# Patient Record
Sex: Male | Born: 1975 | Race: White | Hispanic: Yes | Marital: Single | Smoking: Never smoker
Health system: Southern US, Community
[De-identification: ages and names within clinical notes are randomized; demographics above are authoritative.]

## PROBLEM LIST (undated history)

## (undated) DIAGNOSIS — J310 Chronic rhinitis: Secondary | ICD-10-CM

## (undated) DIAGNOSIS — G4710 Hypersomnia, unspecified: Secondary | ICD-10-CM

## (undated) DIAGNOSIS — F32A Depression, unspecified: Secondary | ICD-10-CM

## (undated) DIAGNOSIS — G4730 Sleep apnea, unspecified: Secondary | ICD-10-CM

## (undated) DIAGNOSIS — F419 Anxiety disorder, unspecified: Secondary | ICD-10-CM

## (undated) DIAGNOSIS — I10 Essential (primary) hypertension: Secondary | ICD-10-CM

## (undated) DIAGNOSIS — H9190 Unspecified hearing loss, unspecified ear: Secondary | ICD-10-CM

## (undated) HISTORY — PX: TOE SURGERY: SHX1073

## (undated) HISTORY — DX: Chronic rhinitis: J31.0

## (undated) HISTORY — PX: WISDOM TOOTH EXTRACTION: SHX21

## (undated) HISTORY — DX: Hypersomnia, unspecified: G47.10

---

## 1998-07-24 ENCOUNTER — Ambulatory Visit: Admission: RE | Admit: 1998-07-24 | Discharge: 1998-07-24 | Payer: Self-pay | Admitting: Internal Medicine

## 2006-03-04 ENCOUNTER — Ambulatory Visit: Payer: Self-pay | Admitting: Internal Medicine

## 2007-03-19 DIAGNOSIS — G4710 Hypersomnia, unspecified: Secondary | ICD-10-CM | POA: Insufficient documentation

## 2007-03-21 ENCOUNTER — Ambulatory Visit: Payer: Self-pay | Admitting: Internal Medicine

## 2007-04-15 ENCOUNTER — Telehealth: Payer: Self-pay | Admitting: Internal Medicine

## 2007-05-11 ENCOUNTER — Telehealth: Payer: Self-pay | Admitting: Internal Medicine

## 2007-06-20 ENCOUNTER — Telehealth: Payer: Self-pay | Admitting: Internal Medicine

## 2007-07-14 ENCOUNTER — Telehealth: Payer: Self-pay | Admitting: Internal Medicine

## 2007-08-15 ENCOUNTER — Telehealth: Payer: Self-pay | Admitting: Internal Medicine

## 2007-09-19 ENCOUNTER — Telehealth: Payer: Self-pay | Admitting: Internal Medicine

## 2007-10-19 ENCOUNTER — Telehealth: Payer: Self-pay | Admitting: Internal Medicine

## 2007-11-15 ENCOUNTER — Telehealth: Payer: Self-pay | Admitting: Internal Medicine

## 2007-12-16 ENCOUNTER — Telehealth (INDEPENDENT_AMBULATORY_CARE_PROVIDER_SITE_OTHER): Payer: Self-pay | Admitting: *Deleted

## 2008-01-13 ENCOUNTER — Telehealth (INDEPENDENT_AMBULATORY_CARE_PROVIDER_SITE_OTHER): Payer: Self-pay | Admitting: *Deleted

## 2008-02-09 ENCOUNTER — Telehealth: Payer: Self-pay | Admitting: Internal Medicine

## 2008-03-20 ENCOUNTER — Ambulatory Visit: Payer: Self-pay | Admitting: Internal Medicine

## 2008-03-20 DIAGNOSIS — J302 Other seasonal allergic rhinitis: Secondary | ICD-10-CM | POA: Insufficient documentation

## 2008-03-20 DIAGNOSIS — J3089 Other allergic rhinitis: Secondary | ICD-10-CM

## 2008-04-09 ENCOUNTER — Ambulatory Visit (HOSPITAL_BASED_OUTPATIENT_CLINIC_OR_DEPARTMENT_OTHER): Admission: RE | Admit: 2008-04-09 | Discharge: 2008-04-09 | Payer: Self-pay | Admitting: Internal Medicine

## 2008-04-09 ENCOUNTER — Encounter: Payer: Self-pay | Admitting: Internal Medicine

## 2008-04-13 ENCOUNTER — Telehealth: Payer: Self-pay | Admitting: Internal Medicine

## 2008-04-13 ENCOUNTER — Ambulatory Visit: Payer: Self-pay | Admitting: Internal Medicine

## 2008-05-03 ENCOUNTER — Ambulatory Visit: Payer: Self-pay | Admitting: Internal Medicine

## 2008-06-14 ENCOUNTER — Telehealth: Payer: Self-pay | Admitting: Internal Medicine

## 2008-07-16 ENCOUNTER — Telehealth: Payer: Self-pay | Admitting: Internal Medicine

## 2008-08-17 ENCOUNTER — Telehealth: Payer: Self-pay | Admitting: Internal Medicine

## 2008-11-13 ENCOUNTER — Telehealth (INDEPENDENT_AMBULATORY_CARE_PROVIDER_SITE_OTHER): Payer: Self-pay | Admitting: *Deleted

## 2008-11-27 ENCOUNTER — Telehealth: Payer: Self-pay | Admitting: Internal Medicine

## 2008-12-18 ENCOUNTER — Telehealth: Payer: Self-pay | Admitting: Internal Medicine

## 2009-01-15 ENCOUNTER — Telehealth (INDEPENDENT_AMBULATORY_CARE_PROVIDER_SITE_OTHER): Payer: Self-pay | Admitting: *Deleted

## 2009-02-15 ENCOUNTER — Telehealth: Payer: Self-pay | Admitting: Internal Medicine

## 2009-02-19 ENCOUNTER — Ambulatory Visit: Payer: Self-pay | Admitting: Internal Medicine

## 2009-03-18 ENCOUNTER — Telehealth (INDEPENDENT_AMBULATORY_CARE_PROVIDER_SITE_OTHER): Payer: Self-pay | Admitting: *Deleted

## 2009-04-12 ENCOUNTER — Telehealth: Payer: Self-pay | Admitting: Internal Medicine

## 2009-05-14 ENCOUNTER — Telehealth: Payer: Self-pay | Admitting: Internal Medicine

## 2009-06-17 ENCOUNTER — Telehealth: Payer: Self-pay | Admitting: Internal Medicine

## 2009-07-16 ENCOUNTER — Telehealth (INDEPENDENT_AMBULATORY_CARE_PROVIDER_SITE_OTHER): Payer: Self-pay | Admitting: *Deleted

## 2009-08-15 ENCOUNTER — Telehealth: Payer: Self-pay | Admitting: Internal Medicine

## 2009-09-16 ENCOUNTER — Telehealth: Payer: Self-pay | Admitting: Internal Medicine

## 2009-10-09 ENCOUNTER — Telehealth (INDEPENDENT_AMBULATORY_CARE_PROVIDER_SITE_OTHER): Payer: Self-pay | Admitting: *Deleted

## 2009-11-07 ENCOUNTER — Telehealth (INDEPENDENT_AMBULATORY_CARE_PROVIDER_SITE_OTHER): Payer: Self-pay | Admitting: *Deleted

## 2009-12-12 ENCOUNTER — Telehealth (INDEPENDENT_AMBULATORY_CARE_PROVIDER_SITE_OTHER): Payer: Self-pay | Admitting: *Deleted

## 2009-12-16 ENCOUNTER — Telehealth (INDEPENDENT_AMBULATORY_CARE_PROVIDER_SITE_OTHER): Payer: Self-pay | Admitting: *Deleted

## 2010-01-08 ENCOUNTER — Telehealth (INDEPENDENT_AMBULATORY_CARE_PROVIDER_SITE_OTHER): Payer: Self-pay | Admitting: *Deleted

## 2010-01-21 ENCOUNTER — Ambulatory Visit: Payer: Self-pay | Admitting: Internal Medicine

## 2010-02-06 ENCOUNTER — Telehealth (INDEPENDENT_AMBULATORY_CARE_PROVIDER_SITE_OTHER): Payer: Self-pay | Admitting: *Deleted

## 2010-02-10 ENCOUNTER — Telehealth (INDEPENDENT_AMBULATORY_CARE_PROVIDER_SITE_OTHER): Payer: Self-pay | Admitting: *Deleted

## 2010-02-11 ENCOUNTER — Ambulatory Visit: Payer: Self-pay | Admitting: Internal Medicine

## 2010-02-12 ENCOUNTER — Encounter: Payer: Self-pay | Admitting: Internal Medicine

## 2010-02-13 ENCOUNTER — Telehealth (INDEPENDENT_AMBULATORY_CARE_PROVIDER_SITE_OTHER): Payer: Self-pay | Admitting: *Deleted

## 2010-02-14 LAB — CONVERTED CEMR LAB: Rh Type: POSITIVE

## 2010-03-12 ENCOUNTER — Telehealth (INDEPENDENT_AMBULATORY_CARE_PROVIDER_SITE_OTHER): Payer: Self-pay | Admitting: *Deleted

## 2010-04-09 ENCOUNTER — Telehealth (INDEPENDENT_AMBULATORY_CARE_PROVIDER_SITE_OTHER): Payer: Self-pay | Admitting: *Deleted

## 2010-05-12 ENCOUNTER — Telehealth: Payer: Self-pay | Admitting: Internal Medicine

## 2010-05-21 ENCOUNTER — Telehealth (INDEPENDENT_AMBULATORY_CARE_PROVIDER_SITE_OTHER): Payer: Self-pay | Admitting: *Deleted

## 2010-06-24 NOTE — Progress Notes (Signed)
Summary: results of bloodwork- called again  Phone Note Call from Patient   Caller: wife [phone] Call For: young Summary of Call: looking for lab results Initial call taken by: Lacinda Axon,  February 13, 2010 2:39 PM  Follow-up for Phone Call        Cy, pt had bloodwork done yesterday, 02/12/2010.  Lab unsigned.  Please advise.  Thank you.  Marland KitchenArman Filter LPN  February 13, 2010 3:07 PM  Pt's spouse Silva Bandy called back again req results. [phone]. Tivis Ringer, CNA  February 14, 2010 3:21 PM  Additional Follow-up for Phone Call Additional follow up Details #1::        Pt is aware of blood type. Verlon Au spoke with spouse-pt gave okay to give results.[name] CMA  February 14, 2010 4:09 PM

## 2010-06-24 NOTE — Progress Notes (Signed)
Summary: PRESCRIPT  Phone Note Call from Patient Call back at [phone]   Caller: Patient Call For: Laasya Peyton Summary of Call: NEED GENERIC RITALIN 10MG  AND 20MG  EXTENDED RELEASE Initial call taken by: Rickard Patience,  September 16, 2009 9:42 AM  Follow-up for Phone Call        rx's printed for cy to sign, pt would like rx's mailed to him   Philipp Deputy [facility]  September 16, 2009 11:26 AM   Additional Follow-up for Phone Call Additional follow up Details #1::        Pt aware that Rx is in mail.[name] CMA  September 16, 2009 11:55 AM     Prescriptions: RITALIN 10 MG  TABS (METHYLPHENIDATE HCL) 1-2 daily prn  #60 x 0   Entered by:   Philipp Deputy CMA   Authorized by:   Waymon Budge MD   Signed by:   Philipp Deputy CMA on 09/16/2009   Method used:   Print then Mail to Patient   RxID:   [id number] RITALIN SR 20 MG  TBCR (METHYLPHENIDATE HCL) one by mouth qd  #30 x 0   Entered by:   Philipp Deputy CMA   Authorized by:   Waymon Budge MD   Signed by:   Philipp Deputy CMA on 09/16/2009   Method used:   Print then Mail to Patient   RxID:   [id number]

## 2010-06-24 NOTE — Progress Notes (Signed)
Summary: prescript  Phone Note Call from Patient Call back at [phone]   Caller: Patient Call For: Tommy Ellison Summary of Call: need prescript for generic ritalin 20mg  er and 10mg  pls mail to home Initial call taken by: Rickard Patience,  August 15, 2009 3:37 PM  Follow-up for Phone Call        rx printed and placed on CY look-at. Carron Curie CMA  August 15, 2009 4:39 PM     Prescriptions: RITALIN 10 MG  TABS (METHYLPHENIDATE HCL) 1-2 daily prn  #60 x 0   Entered by:   Carron Curie CMA   Authorized by:   Waymon Budge MD   Signed by:   Carron Curie CMA on 08/15/2009   Method used:   Print then Mail to Patient   RxID:   [id number] RITALIN SR 20 MG  TBCR (METHYLPHENIDATE HCL) one by mouth qd  #30 x 0   Entered by:   Carron Curie CMA   Authorized by:   Waymon Budge MD   Signed by:   Carron Curie CMA on 08/15/2009   Method used:   Print then Mail to Patient   RxID:   [id number]

## 2010-06-24 NOTE — Progress Notes (Signed)
Summary: rx  Phone Note Call from Patient Call back at [phone]   Caller: Patient Call For: wert Reason for Call: Refill Medication Summary of Call: Need rx for Ritalin 10mg  and 20mg .  Generic.  Please mail to pt's address. Initial call taken by: Eugene Gavia,  Oct 09, 2009 1:56 PM  Follow-up for Phone Call        RX signed and placed in mail as requested.[name] CMA  Oct 10, 2009 10:00 AM     Prescriptions: RITALIN 10 MG  TABS (METHYLPHENIDATE HCL) 1-2 daily prn  #60 x 0   Entered by:   Vernie Murders   Authorized by:   Waymon Budge MD   Signed by:   Vernie Murders on 10/09/2009   Method used:   Print then Give to Patient   RxID:   [id number] RITALIN SR 20 MG  TBCR (METHYLPHENIDATE HCL) one by mouth qd  #30 x 0   Entered by:   Vernie Murders   Authorized by:   Waymon Budge MD   Signed by:   Vernie Murders on 10/09/2009   Method used:   Print then Give to Patient   RxID:   [id number]

## 2010-06-24 NOTE — Progress Notes (Signed)
Summary: waitin on ritalin rx's   Phone Note Call from Patient   Caller: Patient Call For: young Summary of Call: pt has not yet received his 2 rx's for ritalin. he did , however, receive a letter stating that he needed an appt w/ cy. pt was last seen 02/19/09 so it hasn't even been a yr yet. he has an appt pend for 8/30. however, he needs his rx's asap. call him at [phone] Initial call taken by: Tivis Ringer, CNA,  December 16, 2009 10:25 AM  Follow-up for Phone Call        per EMR, no documentation of letter in EMR.  pt has upcoming appt w/ CDY 8.30.11.  also per EMR, letter was put in the mail thursday afternoon which means it was sent out friday since our mail picks up at 3pm.  so sign that rx's were left up front for pt to pick up.  called spoke with patient, advised him to disregard the letter that he received at that his rx's should arrive tomorrow or wednesday.  pt verbalized his understanding but will call if he does not receive them since tomorrow is last day. Follow-up by: Boone Master CNA/MA,  December 16, 2009 11:16 AM

## 2010-06-24 NOTE — Progress Notes (Signed)
Summary: ritalin rx's - waiting for rx's to be signed  Phone Note Call from Patient Call back at [phone]   Caller: Patient Call For: young Summary of Call: need prescript for generic ritalin 20mg  and 10mg  pls mail to him Initial call taken by: Rickard Patience,  January 08, 2010 3:23 PM  Follow-up for Phone Call        CDY is out fo the office until Monday.  pt's last refills on the ritalin 10mg  and 20mg  was 7.21.11.  if patient has enough at home to last him, we can get CDY to sign when he returns to the office.  otherwise will see if [facility] will sign for pt.  LMOM TCB. Boone Master CNA/MA  January 08, 2010 3:44 PM   [facility] would you sign both Ritalin RX's for this patient? They need to be mailed to him. Please advise. Follow-up by: Michel Bickers CMA,  January 08, 2010 3:53 PM  Additional Follow-up for Phone Call Additional follow up Details #1::        yes Additional Follow-up by: Barbaraann Share MD,  January 08, 2010 4:59 PM    Additional Follow-up for Phone Call Additional follow up Details #2::    ritalin rx's printed off for [facility] to sign. Boone Master CNA/MA  January 08, 2010 5:00 PM    kc signed rx's and mailed to pt's home address which i verified with pt was the correct address.  Aundra Millet Reynolds LPN  January 08, 2010 5:10 PM   Prescriptions: RITALIN 10 MG  TABS (METHYLPHENIDATE HCL) 1-2 daily prn  #60 x 0   Entered by:   Boone Master CNA/MA   Authorized by:   Barbaraann Share MD   Signed by:   Boone Master CNA/MA on 01/08/2010   Method used:   Print then Give to Patient   RxID:   [id number] RITALIN SR 20 MG  TBCR (METHYLPHENIDATE HCL) one by mouth qd  #30 x 0   Entered by:   Boone Master CNA/MA   Authorized by:   Barbaraann Share MD   Signed by:   Boone Master CNA/MA on 01/08/2010   Method used:   Print then Give to Patient   RxID:   [id number]

## 2010-06-24 NOTE — Progress Notes (Signed)
Summary: prescript  Phone Note Call from Patient   Caller: Patient Call For: young Summary of Call: need prescript for generic ritalin 20er and 10mg  Initial call taken by: Rickard Patience,  March 12, 2010 4:03 PM  Follow-up for Phone Call        Last OV 8.30.11, No pending OV  Called, spoke with pt's wife, Lorene Dy.  She states they will need to pick up ritalin rxs.  Requesting to do this tomorrow.  Pls call pt's cell [phone] when ready for pick up.  Rxs printed and placed on CY's cart.  Gweneth Dimitri RN  March 12, 2010 4:22 PM   Additional Follow-up for Phone Call Additional follow up Details #1::        Spoke with patient to let him know that RX's were ready for pick up-pt decided that he would rather have them mailed to him. Pt aware the we are mailing rx's today.[name] CMA  March 12, 2010 4:44 PM     Prescriptions: RITALIN 10 MG  TABS (METHYLPHENIDATE HCL) 1-2 daily prn  #60 x 0   Entered by:   Gweneth Dimitri RN   Authorized by:   Waymon Budge MD   Signed by:   Gweneth Dimitri RN on 03/12/2010   Method used:   Print then Give to Patient   RxID:   [id number] RITALIN SR 20 MG  TBCR (METHYLPHENIDATE HCL) one by mouth qd  #30 x 0   Entered by:   Gweneth Dimitri RN   Authorized by:   Waymon Budge MD   Signed by:   Gweneth Dimitri RN on 03/12/2010   Method used:   Print then Give to Patient   RxID:   [id number]

## 2010-06-24 NOTE — Progress Notes (Signed)
Summary: ritalin  Phone Note Call from Patient   Caller: Patient Call For: young Summary of Call: pt wants rx- both strengths of ritalin- mailed to home address. pt's # is [id number] Initial call taken by: Tivis Ringer, CNA,  July 16, 2009 12:39 PM  Follow-up for Phone Call        Rxs printed and given to CY to sign.  Gweneth Dimitri RN  July 16, 2009 3:07 PM  Spoke with pt and made aware that rxs were placed in the mail today. Follow-up by: Vernie Murders,  July 17, 2009 9:20 AM    Prescriptions: RITALIN 10 MG  TABS (METHYLPHENIDATE HCL) 1-2 daily prn  #60 x 0   Entered by:   Gweneth Dimitri RN   Authorized by:   Waymon Budge MD   Signed by:   Gweneth Dimitri RN on 07/16/2009   Method used:   Print then Mail to Patient   RxID:   [id number] RITALIN SR 20 MG  TBCR (METHYLPHENIDATE HCL) one by mouth qd  #30 x 0   Entered by:   Gweneth Dimitri RN   Authorized by:   Waymon Budge MD   Signed by:   Gweneth Dimitri RN on 07/16/2009   Method used:   Print then Mail to Patient   RxID:   [id number]

## 2010-06-24 NOTE — Progress Notes (Signed)
Summary: prescript  Phone Note Call from Patient Call back at [phone]   Caller: Patient Call For: Tommy Ellison Summary of Call: need methylim 20mg  and 10mg  mail to pt Initial call taken by: Rickard Patience,  June 17, 2009 9:37 AM  Follow-up for Phone Call        RX placed on CDY's cart awaiting signature. Enveloped provided for mailing.Michel Bickers [facility]  June 17, 2009 9:47 AM  Additional Follow-up for Phone Call Additional follow up Details #1::        Spoek with pt; aware that RX is being mailed to him.[name] CMA  June 17, 2009 11:55 AM     Prescriptions: RITALIN 10 MG  TABS (METHYLPHENIDATE HCL) 1-2 daily prn  #60 x 0   Entered by:   Michel Bickers CMA   Authorized by:   Waymon Budge MD   Signed by:   Michel Bickers CMA on 06/17/2009   Method used:   Print then Give to Patient   RxID:   [id number] RITALIN SR 20 MG  TBCR (METHYLPHENIDATE HCL) one by mouth qd  #30 x 0   Entered by:   Michel Bickers CMA   Authorized by:   Waymon Budge MD   Signed by:   Michel Bickers CMA on 06/17/2009   Method used:   Print then Give to Patient   RxID:   [id number]

## 2010-06-24 NOTE — Progress Notes (Signed)
Summary: PRESCRIPT  Phone Note Call from Patient Call back at [phone]   Caller: Patient Call For: [name] Summary of Call: NEED GENERIC  RITALIN 10MG  AND 20MG  ER MAILED TO PT Initial call taken by: Rickard Patience,  November 07, 2009 3:53 PM  Follow-up for Phone Call        pls sign printed rx's--and give to [facility] to mail Follow-up by: Philipp Deputy CMA,  November 07, 2009 4:27 PM  Additional Follow-up for Phone Call Additional follow up Details #1::        Pt aware that rx sent to home address.[name] CMA  November 07, 2009 4:51 PM     Prescriptions: RITALIN 10 MG  TABS (METHYLPHENIDATE HCL) 1-2 daily prn  #60 x 0   Entered by:   Philipp Deputy CMA   Authorized by:   Waymon Budge MD   Signed by:   Philipp Deputy CMA on 11/07/2009   Method used:   Print then Give to Patient   RxID:   [id number] RITALIN SR 20 MG  TBCR (METHYLPHENIDATE HCL) one by mouth qd  #30 x 0   Entered by:   Philipp Deputy CMA   Authorized by:   Waymon Budge MD   Signed by:   Philipp Deputy CMA on 11/07/2009   Method used:   Print then Give to Patient   RxID:   [id number]

## 2010-06-24 NOTE — Progress Notes (Signed)
Summary: rx  Phone Note Call from Patient Call back at [phone]   Caller: Patient Call For: young Summary of Call: pt needs written rx for Ritalin 10mg  & 20mg  ER.  Please mail to pt's home address. Initial call taken by: Eugene Gavia,  December 12, 2009 3:08 PM  Follow-up for Phone Call        rx has been printed out and placed on CY cart to be signed. Randell Loop [facility]  December 12, 2009 3:45 PM   Additional Follow-up for Phone Call Additional follow up Details #1::        Mailed to pt.[name] CMA  December 12, 2009 3:52 PM     Prescriptions: RITALIN 10 MG  TABS (METHYLPHENIDATE HCL) 1-2 daily prn  #60 x 0   Entered by:   Randell Loop CMA   Authorized by:   Waymon Budge MD   Signed by:   Randell Loop CMA on 12/12/2009   Method used:   Print then Give to Patient   RxID:   [id number] RITALIN SR 20 MG  TBCR (METHYLPHENIDATE HCL) one by mouth qd  #30 x 0   Entered by:   Randell Loop CMA   Authorized by:   Waymon Budge MD   Signed by:   Randell Loop CMA on 12/12/2009   Method used:   Print then Give to Patient   RxID:   [id number]

## 2010-06-24 NOTE — Progress Notes (Signed)
Summary: refill  Phone Note Call from Patient Call back at [phone]   Caller: Patient Call For: young Reason for Call: Refill Medication Summary of Call: Need written rxs for ritalin sr 20mg  and ritalin 10mg  (generic for both) wants them mailed to his home. Initial call taken by: Darletta Moll,  April 09, 2010 1:26 PM  Follow-up for Phone Call        rx's printed and placed on cy's cart for signature, pls mail to pt once signed and document when they were mailed.  Philipp Deputy [facility]  April 09, 2010 2:02 PM    Rx's have been mailed.[name] CMA  April 09, 2010 4:07 PM     Prescriptions: RITALIN 10 MG  TABS (METHYLPHENIDATE HCL) 1-2 daily prn  #60 x 0   Entered by:   Philipp Deputy CMA   Authorized by:   Waymon Budge MD   Signed by:   Philipp Deputy CMA on 04/09/2010   Method used:   Print then Mail to Patient   RxID:   [id number] RITALIN SR 20 MG  TBCR (METHYLPHENIDATE HCL) one by mouth qd  #30 x 0   Entered by:   Philipp Deputy CMA   Authorized by:   Waymon Budge MD   Signed by:   Philipp Deputy CMA on 04/09/2010   Method used:   Print then Mail to Patient   RxID:   [id number]

## 2010-06-24 NOTE — Progress Notes (Signed)
Summary: ritalin rxs  Phone Note Call from Patient   Caller: Patient Call For: young Summary of Call: pt requests BOTH rx's for ritalin. mail to pt's address. pt # [id number] Initial call taken by: Tivis Ringer, CNA,  February 06, 2010 10:48 AM  Follow-up for Phone Call        Last OV 8.30.11, no pending OV. Rxs printed and placed on CY's cart for signature.   LMOMTCB to verify pt's address.   Gweneth Dimitri RN  February 06, 2010 10:53 AM  Pt returned call. I have verified his home/ mailing address. Tivis Ringer, CNA  February 06, 2010 10:55 AM  has this been mailed yet? Carron Curie CMA  February 07, 2010 9:17 AM   Additional Follow-up for Phone Call Additional follow up Details #1::        Mailed to pts home address as requested.[name] CMA  February 07, 2010 9:33 AM     Prescriptions: RITALIN 10 MG  TABS (METHYLPHENIDATE HCL) 1-2 daily prn  #60 x 0   Entered by:   Gweneth Dimitri RN   Authorized by:   Waymon Budge MD   Signed by:   Gweneth Dimitri RN on 02/06/2010   Method used:   Print then Mail to Patient   RxID:   [id number] RITALIN SR 20 MG  TBCR (METHYLPHENIDATE HCL) one by mouth qd  #30 x 0   Entered by:   Gweneth Dimitri RN   Authorized by:   Waymon Budge MD   Signed by:   Gweneth Dimitri RN on 02/06/2010   Method used:   Print then Mail to Patient   RxID:   [id number]

## 2010-06-24 NOTE — Assessment & Plan Note (Signed)
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 He was off it the night of his sleep study, but still felt some residual benefit. Still using methylphenadate. Only side effect of protriptylline seems to be that it makes his head itch. Not aware of palpitation. 04/09/08-NPSG AHI0.2/hr, which is normal. Did have frequent PVCs- discussed. Denies chest pain, syncope.  02/19/09- ideopathic hypersomnia- He continues ritalin 10 mg, 2 daily, and Ritalin 20 SR once daily. Protriptylline continues to help. Never feels overmedicated, nervous or wired. Denies other medical problems.  He works very hard, now new baby at home. Gets 6-8 hours sleep/ night.  January 21, 2010- ideopathic hypersomnia.......................Marland Kitchenhere with infant son Expecting second child soon and he understands implications for sleep, need to nap and etc. Otherwise he denies changes in circumstance, sleep pattrerns or med needs.  He continues ritalin 10 mg, 2 dily, Ritlalin 20 mg SR once daily , and  protriptyline taken every night to suppress snoring. He tolerates protriptyline and snored loudly again when he quit it.  Denies headache, palpitation, chest pain, syncope.     Preventive Screening-Counseling & Management  Alcohol-Tobacco     Smoking Status: never  Current Medications (verified): 1)  Ritalin Sr 20 Mg  Tbcr (Methylphenidate Hcl) .... One By Mouth Qd 2)  Ritalin 10 Mg  Tabs (Methylphenidate Hcl) .Marland Kitchen.. 1-2 Daily Prn 3)  Protriptyline Hcl 5 Mg Tabs (Protriptyline Hcl) .Marland Kitchen.. 1-2 At Bedtime As Needed  Allergies (verified): No Known Drug Allergies  Past History:  Past Medical History: Last updated: 03/20/2008 NPSG 07/24/98- AHI 0/hr, weighed 145 Ideopathic hypersomnia Rhinitis-  perennial  Past Surgical History: Last updated: 02/19/2009 Toe surgery Wisdom teeth  Family History: Last updated: 04/05/2008 See paper chart mother alive age 53  hx of arthritis and depression father alive age 68 hx of arthritis 1 sibling alive age 6 1 sibling alive age 35  Social History: Last updated: 04/05/2008 Patient never smoked.  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    Vital Signs:  Patient profile:   35 year old male Height:      69 inches Weight:      175.25 pounds BMI:     25.97 O2 Sat:      99 % on Room air Pulse rate:   125 / minute BP sitting:   140 / 98  (left arm) Cuff size:   regular  Vitals Entered By: [name] CMA (January 21, 2010 3:53 PM)  O2 Flow:  Room air CC: yearly follow up visit-sleep; been doing okay.   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 We discussed meds and alternatives. He remains controlled on stable doses of Ritalin which he manages appropriately.  He has  always found that Vivactil/ protriptyline helps to reduce snoring. Previous sleep study did not show sleep apnea. Sleep hygiene has been acceptable.  Other Orders: Est. Patient Level III (83151)  Patient Instructions: 1)  Please schedule a follow-up appointment in 1 year. 2)  Call as needed for med refills or any questions or concerns

## 2010-06-24 NOTE — Progress Notes (Signed)
Summary: lab order for blood type  Phone Note Call from Patient Call back at [phone]   Caller: Spouse  Christy Summary of Call: Patient's wife Tommy Ellison called.  Patient wants to know if Dr. [name] will give order for blood test to determine type.  It is urgent.  Follow-up for Phone Call        called and spoke with pt's wife, Tommy Ellison.  Tommy Ellison states pt is in the process of applying to be a candidate for kidney donation.  She states pt is needing to know his blood type.  Pt does not have a PCP.  Wanted to know if CY will ok lab order for blood type.  Will forward message to CY to address.  [name] LPN  February 10, 2010 3:45 PM   Additional Follow-up for Phone Call Additional follow up Details #1::        per Dr [name] OK to order blood type. [name] CMA  February 10, 2010 3:50 PM     Additional Follow-up for Phone Call Additional follow up Details #2::    called and spoke with pt's spouse.  spouse aware CY ok'd for pt to have bloodwork for blood type.  wife stated she will relay message to pt. [name] LPN  February 10, 2010 5:24 PM

## 2010-06-26 NOTE — Progress Notes (Signed)
Summary: rx  Phone Note Call from Patient Call back at [phone]   Caller: Patient Call For: young Summary of Call: Patient never recieved rx for ritalin. Needs written rxs for ritalin sr 20mg  and ritalin 10mg  (generic for both) needs to pick up ASAP. Initial call taken by: Lehman Prom,  May 21, 2010 11:23 AM  Follow-up for Phone Call        Will forward to CDY to sign so that he can pick up today.  Follow-up by: Vernie Murders,  May 21, 2010 11:33 AM  Additional Follow-up for Phone Call Additional follow up Details #1::        Spoke with pt and advised rxs ready to be picked up, Additional Follow-up by: Vernie Murders,  May 21, 2010 2:29 PM    Prescriptions: RITALIN 10 MG  TABS (METHYLPHENIDATE HCL) 1-2 daily prn  #60 x 0   Entered by:   Vernie Murders   Authorized by:   Waymon Budge MD   Signed by:   Vernie Murders on 05/21/2010   Method used:   Print then Give to Patient   RxID:   [id number] RITALIN SR 20 MG  TBCR (METHYLPHENIDATE HCL) one by mouth qd  #30 x 0   Entered by:   Vernie Murders   Authorized by:   Waymon Budge MD   Signed by:   Vernie Murders on 05/21/2010   Method used:   Print then Give to Patient   RxID:   [id number]

## 2010-06-26 NOTE — Progress Notes (Signed)
Summary: written rx  Phone Note Call from Patient Call back at [phone]   Caller: Patient Call For: Tommy Ellison Reason for Call: Refill Medication, Talk to Nurse Summary of Call: Needs written rxs for ritalin sr 20mg  and ritalin 10mg  (generic for both) wants them mailed to his home Initial call taken by: Lehman Prom,  May 12, 2010 2:27 PM  Follow-up for Phone Call        rx sent to CY look0at to sign. Carron Curie CMA  May 12, 2010 3:46 PM  rx mailed. pt aware.Carron Curie CMA  May 12, 2010 4:06 PM     Prescriptions: RITALIN 10 MG  TABS (METHYLPHENIDATE HCL) 1-2 daily prn  #60 x 0   Entered by:   Carron Curie CMA   Authorized by:   Waymon Budge MD   Signed by:   Carron Curie CMA on 05/12/2010   Method used:   Print then Mail to Patient   RxID:   [id number] RITALIN SR 20 MG  TBCR (METHYLPHENIDATE HCL) one by mouth qd  #30 x 0   Entered by:   Carron Curie CMA   Authorized by:   Waymon Budge MD   Signed by:   Carron Curie CMA on 05/12/2010   Method used:   Print then Mail to Patient   RxID:   [id number]

## 2010-07-11 ENCOUNTER — Telehealth (INDEPENDENT_AMBULATORY_CARE_PROVIDER_SITE_OTHER): Payer: Self-pay | Admitting: *Deleted

## 2010-07-16 NOTE — Progress Notes (Signed)
Summary: written rx  Phone Note Call from Patient Call back at [phone]   Caller: Patient Call For: young Reason for Call: Talk to Nurse Summary of Call: Needs written rx for ritalin sr 20mg  and ritalin 10mg  (generic for both) needs to be mailed to patient's home address. Initial call taken by: Lehman Prom,  July 11, 2010 3:19 PM  Follow-up for Phone Call        Rx to Dr [name]  for signature .[name] [facility]  July 11, 2010 5:00 PM  Follow-up by: [name] CMA,  July 11, 2010 5:00 PM    Prescriptions: RITALIN 10 MG  TABS (METHYLPHENIDATE HCL) 1-2 daily prn  #60 x 0   Entered by:   [name] CMA   Authorized by:   Waymon Budge MD   Signed by:   [name] CMA on 07/11/2010   Method used:   Print then Mail to Patient   RxID:   [id number] RITALIN SR 20 MG  TBCR (METHYLPHENIDATE HCL) one by mouth qd  #30 x 0   Entered by:   [name] CMA   Authorized by:   Waymon Budge MD   Signed by:   [name] CMA on 07/11/2010   Method used:   Print then Mail to Patient   RxID:   [id number]

## 2010-08-14 ENCOUNTER — Telehealth: Payer: Self-pay | Admitting: Internal Medicine

## 2010-08-14 MED ORDER — METHYLPHENIDATE HCL 20 MG PO TBCR: 20.0000 mg | EXTENDED_RELEASE_TABLET | ORAL | Status: DC

## 2010-08-14 MED ORDER — METHYLPHENIDATE HCL 10 MG PO TABS: ORAL_TABLET | ORAL | Status: DC

## 2010-08-14 NOTE — Telephone Encounter (Signed)
Pt last saw CY on 01/21/2010 for his yearly f/u appt. Pt was last given rx for these meds on 07/11/2010.  Will print rx for Cy to sign to give to pt.

## 2010-08-21 NOTE — Telephone Encounter (Signed)
Spoke with pt and he received rx in mail. Carron Curie, CMA

## 2010-09-08 ENCOUNTER — Telehealth: Payer: Self-pay | Admitting: Internal Medicine

## 2010-09-08 DIAGNOSIS — G4710 Hypersomnia, unspecified: Secondary | ICD-10-CM

## 2010-09-08 MED ORDER — METHYLPHENIDATE HCL 20 MG PO TBCR: 20.0000 mg | EXTENDED_RELEASE_TABLET | ORAL | Status: DC

## 2010-09-08 MED ORDER — METHYLPHENIDATE HCL 10 MG PO TABS: ORAL_TABLET | ORAL | Status: DC

## 2010-09-08 NOTE — Telephone Encounter (Signed)
rx printed and placed on CY look-at to sign. Carron Curie, CMA

## 2010-09-08 NOTE — Telephone Encounter (Signed)
Left message that we have placed in mail

## 2010-10-07 ENCOUNTER — Encounter: Payer: Self-pay | Admitting: Internal Medicine

## 2010-10-07 NOTE — Assessment & Plan Note (Signed)
 HEALTHCARE                             PULMONARY OFFICE NOTE   JOE, GEE                       MRN:          [id number]  DATE:03/21/2007                            DOB:          [birth year]    PROBLEM:  Idiopathic hypersomnia.   HISTORY:  One year followup.  He has done quite well recognizing no  change over years, now that I have followed him.  He understands issues  of good sleep hygiene.  Medication continues to work well with no  tolerance development.  He denies headache, palpitations, chest pain or  mood swings or any concerns about the medications.  He still has no  cataplexy.  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   MEDICATIONS:  1. Ritalin 20 mg SR one daily.  2. Ritalin 10 mg once or twice daily p.r.n.  (using generics).   OBJECTIVE:  VITAL SIGNS:  Weight 170 pounds.  Blood pressure 138/78.  Pulse 113.  Room air saturation 100%.  GENERAL APPEARANCE:  Calm, well-developed, well-nourished, no tremor,  pleasant personality.  HEART:  Heart sounds are regular.  A little rapid, consistent with the  medication that is noted.  LUNGS:  Breathing is unlabored.  NEUROLOGICAL:  Unremarkable to observation.   IMPRESSION:  Stable idiopathic hypersomnia.   PLAN:  Schedule return 12 months, earlier p.r.n.     Clinton D. [name], MD, FCCP, FACP     CDY/MedQ  DD: 03/27/2007  DT: 03/28/2007  Job #: [id number]

## 2010-10-07 NOTE — Assessment & Plan Note (Signed)
[facility]                             PULMONARY OFFICE NOTE   KEYNAN, HEFFERN                       MRN:          [id number]  DATE:03/21/2007                            DOB:          February 25, 1976    PROBLEM:  Idiopathic hypersomnia.   HISTORY:  One year followup.  He has done quite well recognizing no  change over years, now that I have followed him.  He understands issues  of good sleep hygiene.  Medication continues to work well with no  tolerance development.  He denies headache, palpitations, chest pain or  mood swings or any concerns about the medications.  He still has no  cataplexy.  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   MEDICATIONS:  1. Ritalin 20 mg SR one daily.  2. Ritalin 10 mg once or twice daily p.r.n.  (using generics).   OBJECTIVE:  VITAL SIGNS:  Weight 170 pounds.  Blood pressure 138/78.  Pulse 113.  Room air saturation 100%.  GENERAL APPEARANCE:  Calm, well-developed, well-nourished, no tremor,  pleasant personality.  HEART:  Heart sounds are regular.  A little rapid, consistent with the  medication that is noted.  LUNGS:  Breathing is unlabored.  NEUROLOGICAL:  Unremarkable to observation.   IMPRESSION:  Stable idiopathic hypersomnia.   PLAN:  Schedule return 12 months, earlier p.r.n.     Clinton D. [name], MD, [facility], FACP  Electronically Signed    CDY/MedQ  DD: 03/27/2007  DT: 03/28/2007  Job #: [id number]

## 2010-10-07 NOTE — Procedures (Signed)
NAME:  Tommy Ellison, Tommy Ellison                ACCOUNT NO.:  [id number]   MEDICAL RECORD NO.:  [id number]          PATIENT TYPE:  OUT   LOCATION:  SLEEP CENTER                 FACILITY[facility]  [facility]   PHYSICIAN:  Clinton D. [name], MD, FCCP, FACPDATE OF BIRTH:  1976/03/07   DATE OF STUDY:  04/09/2008                            NOCTURNAL POLYSOMNOGRAM   REFERRING PHYSICIAN:   INDICATION FOR STUDY:  Hypersomnia with sleep apnea.   EPWORTH SLEEPINESS SCORE:  13/24.   BMI 25.1.  Weight 170 pounds.  Height 69 inches.  Neck 17 inches.   MEDICATIONS:  Home medications charted and reviewed.   SLEEP ARCHITECTURE:  Total sleep time 336 minutes with sleep efficiency  80.2%.  Stage I was 12.9%.  Stage II 66.5%.  Stage III 8.5.  REM 12.1%  of total sleep time.  Sleep latency 30 minutes.  REM latency 80.5  minutes.  Wake after sleep onset 53 minutes.  Arousal index 27.5.  No  bedtime medication was reported.  Sleep was marked by frequent very  brief wakings with normal REM progression.   RESPIRATORY DATA:  Apnea-hypopnea index (AHI) 0.2 per hour.  Respiratory  disturbance index (RDI) 1.6 per hour.  Respiratory events related to  arousal, 8 with index of 1.4 per hour.  A total of only 1 event was  scored and this was a hypopnea.   OXYGEN DATA:  Moderate snoring with oxygen desaturation to a nadir of  90%.  Mean oxygen saturation for the study 95.9% on room air.   CARDIAC DATA:  Sinus rhythm with frequent PVC throughout the study.   MOVEMENT-PARASOMNIA:  No significant movement disturbance.  Bathroom x1.   IMPRESSIONS-RECOMMENDATIONS:  1. Sleep architecture remarkable primarily for numerous very brief      wakings.  Respiratory sleep disturbance was insignificant, AHI 0.2      per hour, reflecting a single hypopnea.  Moderate snoring with      oxygen desaturation to a nadir of 90%.  2. Cardiac rhythm significant for frequent PVC, noting the patient      takes methylphenidate during the daytime with prior  history of      idiopathic hypersomnia.      Clinton D. [name], MD, [facility], FACP  Diplomate, Biomedical engineer of Sleep Medicine  Electronically Signed     CDY/MEDQ  D:  04/14/2008 14:08:34  T:  04/15/2008 02:23:10  Job:  [id number]

## 2010-10-10 ENCOUNTER — Ambulatory Visit (INDEPENDENT_AMBULATORY_CARE_PROVIDER_SITE_OTHER): Payer: BC Managed Care – PPO | Admitting: Internal Medicine

## 2010-10-10 ENCOUNTER — Encounter: Payer: Self-pay | Admitting: Internal Medicine

## 2010-10-10 VITALS — BP 126/78 | HR 103 | Ht 69.0 in | Wt 165.0 lb

## 2010-10-10 DIAGNOSIS — G4710 Hypersomnia, unspecified: Secondary | ICD-10-CM

## 2010-10-10 DIAGNOSIS — J310 Chronic rhinitis: Secondary | ICD-10-CM

## 2010-10-10 MED ORDER — AMPHETAMINE-DEXTROAMPHET ER 20 MG PO CP24: ORAL_CAPSULE | ORAL | Status: DC

## 2010-10-10 MED ORDER — AMPHETAMINE-DEXTROAMPHETAMINE 10 MG PO TABS: ORAL_TABLET | ORAL | Status: DC

## 2010-10-10 MED ORDER — PROTRIPTYLINE HCL 5 MG PO TABS: 5.0000 mg | ORAL_TABLET | Freq: Every day | ORAL | Status: DC

## 2010-10-10 MED ORDER — CITALOPRAM HYDROBROMIDE 20 MG PO TABS: 20.0000 mg | ORAL_TABLET | Freq: Every day | ORAL | Status: DC

## 2010-10-10 NOTE — Assessment & Plan Note (Signed)
[facility]                               PULMONARY OFFICE NOTE   Tommy Ellison, Tommy Ellison                       MRN:          [id number]  DATE:03/04/2006                            DOB:          07-Mar-1976    PROBLEM:  Idiopathic hypersomnia.   HISTORY:  This young man has been followed since the diagnosis was  originally established, while at [facility].  A  nocturnal polysomnogram in March 2000 was normal with an AHI of zero.  He  did have moderate to loud snoring with normal oxygenation, and multiple  sleep latency tests done the next day recorded a mean sleep latency of 7  minutes with REM on one nap, not diagnostic for narcolepsy.  TSH was normal.  Adequate sleep was indicated in interviews with him, and he was educated on  good sleep hygiene.  His Epworth sleepiness score was 11-12/24.  A number of  stimulant medications were tried in rather high doses, settling on Concerta.  He did well with that, taking 18 mg x3 daily, until insurance change,  stopped covering it in February of 2007.  At that time, we switched him to  generic Ritalin 20 mg sustained actions, one daily.  He comes now to  establish for continuity.  He has not found the single Ritalin tablet to be  as effective as Concerta.  His particular problem is in getting up for work  in the morning.  He was taking a Concerta at first alarm and then taking a  second when he awoke later for breakfast.  He is finding that the sustained  Ritalin does not kick in fast enough, so even though he takes it on first  alarm, an hour before his intended waking with second alarm, he is sleeping  through the clock.  He says once he is up and active, he would fall asleep  in the afternoon only if relaxed and quiet.  He denies problems with driving  and does not feel over-stimulated.  He does drink some soft drinks for  caffeine occasionally.  He denies any use of street drugs, and  his wife  tells him he only snores occasionally.  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   MEDICATIONS:  Limited to Ritalin 20 mg SR with NO MEDICATION ALLERGY and no  routine primary physician.   OBJECTIVE:  VITAL SIGNS:  Weight 163 pounds, BP 116/68, pulse regular 66,  room air saturation 98%.  GENERAL:  Well-developed, well-nourished, comfortable-appearing, calm young  man.  There is no tremor.  PULSE:  Regular.  HEART:  Sounds are normal.  HEENT:  Nose and throat are clear.  LUNGS:  Clear.   IMPRESSION:  1. 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  2. Previous diagnosis of rhinitis with septum complaint, that he gets      nasal congestion when supine.   PLAN:  1. He is given a parallel prescription for generic Ritalin 10-mg tabs,      suggesting 1  or 2 daily if needed as a supplement and with intention      that he take a 10-mg standard release Ritalin on first alarm in the      morning, then take his sustained release 20-mg Ritalin at second alarm,      continuing that once daily.  We talked again about side effects over      stimulation, tolerance, dependence, misuse and the controlled nature of      the drug.  2. Sample Nasonex for trial, once each nostril.  3. Schedule return one year, earlier p.r.n.       Clinton D. [name], MD, FCCP, FACP      CDY/MedQ  DD:  03/06/2006  DT:  03/08/2006  Job #:  [id number]

## 2010-10-10 NOTE — Progress Notes (Signed)
  Subjective:    Patient ID: Tommy Ellison, male    DOB: 01-15-1976, 35 y.o.   MRN: [id number]  HPI 10/10/10- 35 yoM followed for idiopathic hypersomnia, complicated by rhinitis. Last here January 21, 2010. Here today with 7 month baby.  Has felt more tired for several months, easier fatigued. Meds don't seem to energize him as before. Denies fever, chest pain, palpitation. Saw rheumatologist several months ago for hand and knee pain. Told Xrays ok, but "something in blood is up". Admits drinking more alcohol. Beer- 2 to 6 after work most days. But in the past month he has only had 3 beers. . Wife concerned. He went once to a primary physician 3 months ago and had labs drawn. We discussed AA and Behavioral Health.   Review of Systems Constitutional:   No weight loss, night sweats,  Fevers, chills, HEENT:   No headaches,  Difficulty swallowing,  Tooth/dental problems,  Sore throat,                No sneezing, itching, ear ache, nasal congestion, post nasal drip,   CV:  No chest pain,  Orthopnea, PND, swelling in lower extremities, anasarca, dizziness, palpitations  GI  No heartburn, indigestion, abdominal pain, nausea, vomiting, diarrhea, change in bowel habits, loss of appetite  Resp: No shortness of breath with exertion or at rest.  No excess mucus, no productive cough,  No non-productive cough,  No coughing up of blood.  No change in color of mucus.  No wheezing.  No chest wall deformity  Skin: no rash or lesions.  GU: no dysuria, change in color of urine, no urgency or frequency.  No flank pain.  MS:  Wrist pains  or swelling.  No decreased range of motion.  No back pain.  Psych: Asks about more treatment beyond vivactil for possible depression     Objective:   Physical Exam General- Alert, Oriented, Affect-appropriate, Distress- none acute  Calm/ reserved  Skin- rash-none, lesions- none, excoriation- none  Lymphadenopathy- none  Head- atraumatic  Eyes- Gross vision intact,  PERRLA, conjunctivae clear, secretions  Ears- Hearing, canals, Tm - normal  Nose- Clear, No- Septal dev, mucus, polyps, erosion, perforation   Throat- Mallampati II , mucosa clear , drainage- none, tonsils- atrophic  Neck- flexible , trachea midline, no stridor , thyroid nl, carotid no bruit  Chest - symmetrical excursion , unlabored     Heart/CV- RRR , no murmur , no gallop  , no rub, nl s1 s2                     - JVD- none , edema- none, stasis changes- none, varices- none     Lung- clear to P&A, wheeze- none, cough- none , dullness-none, rub- none     Chest wall- Abd- tender-no, distended-no, bowel sounds-present, HSM- no  Br/ Gen/ Rectal- Not done, not indicated  Extrem- cyanosis- none, clubbing, none, atrophy- none, strength- nl  Neuro- grossly intact to observation      Assessment & Plan:

## 2010-10-10 NOTE — Assessment & Plan Note (Signed)
He has already cut way back on his drinking He works hard and may just be getting tired of it, especially now that he has small children to care for. We discussed depression. I can let him try Celexa for awhile, but if it isn't an easy fix, I will steer him to mental health We will let him try Nuvigil and Adderall as separate comparators with ritalin.

## 2010-10-10 NOTE — Patient Instructions (Signed)
Try adding Celexa to lift mood.  Instead of ritalin/ methylphenidate- try comparable doses of Adderall.  Scripts writtten  Instead of either Adderall or ritalin, consider trying samples Nuvigil 250 mg, one daily.

## 2010-10-15 ENCOUNTER — Encounter: Payer: Self-pay | Admitting: Internal Medicine

## 2010-10-15 NOTE — Assessment & Plan Note (Signed)
Noticing increased nasal congestion. Discussed nasal sprays.

## 2010-11-12 ENCOUNTER — Telehealth: Payer: Self-pay | Admitting: Internal Medicine

## 2010-11-12 MED ORDER — AMPHETAMINE-DEXTROAMPHETAMINE 10 MG PO TABS: ORAL_TABLET | ORAL | Status: DC

## 2010-11-12 MED ORDER — AMPHETAMINE-DEXTROAMPHET ER 20 MG PO CP24: ORAL_CAPSULE | ORAL | Status: DC

## 2010-11-12 NOTE — Telephone Encounter (Signed)
rxs placed on CDY's cart to sign.

## 2010-11-13 NOTE — Telephone Encounter (Signed)
Rx Sent to pt via mail.

## 2010-12-09 ENCOUNTER — Encounter: Payer: Self-pay | Admitting: Internal Medicine

## 2010-12-09 ENCOUNTER — Ambulatory Visit (INDEPENDENT_AMBULATORY_CARE_PROVIDER_SITE_OTHER): Payer: BC Managed Care – PPO | Admitting: Internal Medicine

## 2010-12-09 VITALS — BP 126/76 | HR 109 | Ht 69.0 in | Wt 166.4 lb

## 2010-12-09 DIAGNOSIS — G4710 Hypersomnia, unspecified: Secondary | ICD-10-CM

## 2010-12-09 MED ORDER — AMPHETAMINE-DEXTROAMPHETAMINE 10 MG PO TABS: ORAL_TABLET | ORAL | Status: DC

## 2010-12-09 MED ORDER — AMPHETAMINE-DEXTROAMPHET ER 20 MG PO CP24: ORAL_CAPSULE | ORAL | Status: DC

## 2010-12-09 NOTE — Assessment & Plan Note (Addendum)
We have again reviewed meds, considering what would happen if one were dropped or replaced. Vivactil seems to control snoring, although he has not had sleep apnea. For now we will continue the meds he is using.

## 2010-12-09 NOTE — Patient Instructions (Signed)
Continue present meds,with refills given. Call for refills and changes as needed.

## 2010-12-09 NOTE — Progress Notes (Signed)
Subjective:    Patient ID: Tommy Ellison, male    DOB: 1976-02-21, 35 y.o.   MRN: [id number]  HPI  Subjective:    Patient ID: [name], male    DOB: June 30, 1975, 35 y.o.   MRN: [id number]  HPI 10/10/10- 35 yoM followed for idiopathic hypersomnia, complicated by rhinitis. Last here January 21, 2010. Here today with 7 month baby.  Has felt more tired for several months, easier fatigued. Meds don't seem to energize him as before. Denies fever, chest pain, palpitation. Saw rheumatologist several months ago for hand and knee pain. Told Xrays ok, but "something in blood is up". Admits drinking more alcohol. Beer- 2 to 6 after work most days. But in the past month he has only had 3 beers. . Wife concerned. He went once to a primary physician 3 months ago and had labs drawn. We discussed AA and Behavioral Health.  12/09/10- 35 yoM followed for idiopathic hypersomnia, complicated by rhinitis Celexa is working well and making a difference. There was only modest improvement with change from ritalin to adderall. Overall he is feeling better about things. He has cut back substantially on his alcohol. Tried Nuvigil- wasn't strong enough. He thinks vivactil helps reduce his snoring. He had difficulty finding it for awhile and had to shop pharmacies, but doing better.   Review of Systems Constitutional:   No weight loss, night sweats,  Fevers, chills, HEENT:   No headaches,  Difficulty swallowing,  Tooth/dental problems,  Sore throat,                No sneezing, itching, ear ache, nasal congestion, post nasal drip,   CV:  No chest pain,  Orthopnea, PND, swelling in lower extremities, anasarca, dizziness, palpitations  GI  No heartburn, indigestion, abdominal pain, nausea, vomiting, diarrhea, change in bowel habits, loss of appetite  Resp: No shortness of breath with exertion or at rest.  No excess mucus, no productive cough,  No non-productive cough,  No coughing up of blood.  No change in color of  mucus.  No wheezing.   Skin: no rash or lesions.  GU: no dysuria, change in color of urine, no urgency or frequency.  No flank pain.  MS:  Wrist pains  or swelling.  No decreased range of motion.  No back pain.  Psych: Asks about more treatment beyond vivactil for possible depression     Objective:   Physical Exam General- Alert, Oriented, Affect-appropriate, Distress- none acute  Calm/ reserved  Skin- rash-none, lesions- none, excoriation- none  Lymphadenopathy- none  Head- atraumatic  Eyes- Gross vision intact, PERRLA, conjunctivae clear, secretions  Ears- Hearing, canals, Tm - normal  Nose- Clear, No- Septal dev, mucus, polyps, erosion, perforation   Throat- Mallampati II , mucosa clear , drainage- none, tonsils- atrophic  Neck- flexible , trachea midline, no stridor , thyroid nl, carotid no bruit  Chest - symmetrical excursion , unlabored     Heart/CV- RRR , no murmur , no gallop  , no rub, nl s1 s2                     - JVD- none , edema- none, stasis changes- none, varices- none     Lung- clear to P&A, wheeze- none, cough- none , dullness-none, rub- none     Chest wall- Abd- tender-no, distended-no, bowel sounds-present, HSM- no  Br/ Gen/ Rectal- Not done, not indicated  Extrem- cyanosis- none, clubbing, none, atrophy- none, strength- nl  Neuro- grossly intact to observation      Assessment & Plan:     Review of Systems     Objective:   Physical Exam        Assessment & Plan:

## 2010-12-12 ENCOUNTER — Encounter: Payer: Self-pay | Admitting: Internal Medicine

## 2011-01-14 ENCOUNTER — Other Ambulatory Visit: Payer: Self-pay | Admitting: Internal Medicine

## 2011-01-19 ENCOUNTER — Telehealth: Payer: Self-pay | Admitting: Internal Medicine

## 2011-01-19 MED ORDER — AMPHETAMINE-DEXTROAMPHET ER 20 MG PO CP24: ORAL_CAPSULE | ORAL | Status: DC

## 2011-01-19 MED ORDER — AMPHETAMINE-DEXTROAMPHETAMINE 10 MG PO TABS: ORAL_TABLET | ORAL | Status: DC

## 2011-01-19 NOTE — Telephone Encounter (Signed)
rx's printed and placed in dr youngs look at to sign, please call pt for p/u when ready  [phone]

## 2011-01-19 NOTE — Telephone Encounter (Signed)
Pt aware rx's at the front desk and ready for p/u

## 2011-01-19 NOTE — Telephone Encounter (Signed)
PATIENT CALLED TO SEE IF PRESCRIPTION WAS READY TO PICK UP.  HE NEEDS TO PICK IT UP TODAY.

## 2011-01-22 NOTE — Telephone Encounter (Signed)
Please advise if ok to refill this med.  thanks

## 2011-01-23 NOTE — Telephone Encounter (Signed)
Ok to refill till next visit 

## 2011-02-16 ENCOUNTER — Telehealth: Payer: Self-pay | Admitting: Internal Medicine

## 2011-02-16 MED ORDER — AMPHETAMINE-DEXTROAMPHET ER 20 MG PO CP24: ORAL_CAPSULE | ORAL | Status: DC

## 2011-02-16 MED ORDER — AMPHETAMINE-DEXTROAMPHETAMINE 10 MG PO TABS: ORAL_TABLET | ORAL | Status: DC

## 2011-02-16 NOTE — Telephone Encounter (Signed)
I have printed off rx's and is awaiting signature

## 2011-02-16 NOTE — Telephone Encounter (Signed)
Mailed to patient-left message on given number of this.

## 2011-03-19 ENCOUNTER — Telehealth: Payer: Self-pay | Admitting: Internal Medicine

## 2011-03-19 MED ORDER — AMPHETAMINE-DEXTROAMPHET ER 20 MG PO CP24: ORAL_CAPSULE | ORAL | Status: DC

## 2011-03-19 MED ORDER — AMPHETAMINE-DEXTROAMPHETAMINE 10 MG PO TABS: ORAL_TABLET | ORAL | Status: DC

## 2011-03-19 NOTE — Telephone Encounter (Signed)
Pt calling for refill on his 2 adderall rxs.  Last saw Cy on 12/09/10 and was told to f/u in 6 months.  Has pending appt scheduled for 06/09/11.  Last rx for these meds was 02/16/11 x 0 refills.  rx printed and put on cy's cart for him to sign.  Pt aware once rx signed we will mail to his home address.  Nothing further needed.

## 2011-04-17 ENCOUNTER — Telehealth: Payer: Self-pay | Admitting: Internal Medicine

## 2011-04-17 MED ORDER — AMPHETAMINE-DEXTROAMPHET ER 20 MG PO CP24: ORAL_CAPSULE | ORAL | Status: DC

## 2011-04-17 MED ORDER — AMPHETAMINE-DEXTROAMPHETAMINE 10 MG PO TABS: ORAL_TABLET | ORAL | Status: DC

## 2011-04-17 NOTE — Telephone Encounter (Signed)
Rx's signed and mailed to patient.

## 2011-04-17 NOTE — Telephone Encounter (Signed)
Pt last saw CY 12/09/10.  Has pending appt scheduled for 06/09/11.  Last given rx for Adderall XR 20mg  # 30 x 0 on 03/19/11 and Adderall 10mg  # 60 x 0 on 03/19/11.  Printed rx and put on CY's cart for him to sign so we can mail to pt.

## 2011-04-30 ENCOUNTER — Emergency Department (HOSPITAL_BASED_OUTPATIENT_CLINIC_OR_DEPARTMENT_OTHER)
Admission: EM | Admit: 2011-04-30 | Discharge: 2011-04-30 | Disposition: A | Discharge status: Home / Self Care | Payer: BC Managed Care – PPO | Attending: Emergency Medicine | Admitting: Emergency Medicine

## 2011-04-30 ENCOUNTER — Other Ambulatory Visit: Payer: Self-pay

## 2011-04-30 ENCOUNTER — Emergency Department (INDEPENDENT_AMBULATORY_CARE_PROVIDER_SITE_OTHER): Payer: BC Managed Care – PPO

## 2011-04-30 ENCOUNTER — Encounter (HOSPITAL_BASED_OUTPATIENT_CLINIC_OR_DEPARTMENT_OTHER): Payer: Self-pay | Admitting: Family Medicine

## 2011-04-30 DIAGNOSIS — R079 Chest pain, unspecified: Secondary | ICD-10-CM

## 2011-04-30 DIAGNOSIS — R05 Cough: Secondary | ICD-10-CM

## 2011-04-30 DIAGNOSIS — R0989 Other specified symptoms and signs involving the circulatory and respiratory systems: Secondary | ICD-10-CM

## 2011-04-30 DIAGNOSIS — Z0389 Encounter for observation for other suspected diseases and conditions ruled out: Secondary | ICD-10-CM

## 2011-04-30 DIAGNOSIS — R0602 Shortness of breath: Secondary | ICD-10-CM | POA: Insufficient documentation

## 2011-04-30 DIAGNOSIS — I4892 Unspecified atrial flutter: Secondary | ICD-10-CM

## 2011-04-30 DIAGNOSIS — R059 Cough, unspecified: Secondary | ICD-10-CM

## 2011-04-30 DIAGNOSIS — Z79899 Other long term (current) drug therapy: Secondary | ICD-10-CM | POA: Insufficient documentation

## 2011-04-30 HISTORY — DX: Anxiety disorder, unspecified: F41.9

## 2011-04-30 LAB — DIFFERENTIAL
Eosinophils Absolute: 0 10*3/uL (ref 0.0–0.7)
Eosinophils Relative: 1 % (ref 0–5)
Lymphocytes Relative: 19 % (ref 12–46)
Lymphs Abs: 1.2 10*3/uL (ref 0.7–4.0)
Monocytes Relative: 9 % (ref 3–12)

## 2011-04-30 LAB — CBC
Hemoglobin: 16.6 g/dL (ref 13.0–17.0)
MCH: 31.6 pg (ref 26.0–34.0)
MCV: 87.8 fL (ref 78.0–100.0)
RBC: 5.26 MIL/uL (ref 4.22–5.81)

## 2011-04-30 LAB — RAPID URINE DRUG SCREEN, HOSP PERFORMED
Barbiturates: NOT DETECTED
Benzodiazepines: NOT DETECTED
Cocaine: NOT DETECTED
Opiates: NOT DETECTED

## 2011-04-30 LAB — BASIC METABOLIC PANEL
BUN: 8 mg/dL (ref 6–23)
CO2: 20 mEq/L (ref 19–32)
GFR calc non Af Amer: >90 mL/min (ref >90)
Glucose, Bld: 105 mg/dL — ABNORMAL HIGH (ref 70–99)
Potassium: 3.1 mEq/L — ABNORMAL LOW (ref 3.5–5.1)

## 2011-04-30 LAB — TROPONIN I: Troponin I: <0.3 ng/mL (ref <0.30)

## 2011-04-30 MED ORDER — GI COCKTAIL ~~LOC~~
30.0000 mL | Freq: Once | ORAL | Status: AC
  Administered 2011-04-30: 30 mL via ORAL
  Filled 2011-04-30: qty 30

## 2011-04-30 MED ORDER — POTASSIUM CHLORIDE CRYS ER 20 MEQ PO TBCR
40.0000 meq | EXTENDED_RELEASE_TABLET | Freq: Once | ORAL | Status: AC
  Administered 2011-04-30: 40 meq via ORAL
  Filled 2011-04-30: qty 2

## 2011-04-30 MED ORDER — MORPHINE SULFATE 4 MG/ML IJ SOLN
4.0000 mg | Freq: Once | INTRAMUSCULAR | Status: DC
  Filled 2011-04-30: qty 1

## 2011-04-30 MED ORDER — KETOROLAC TROMETHAMINE 30 MG/ML IJ SOLN
30.0000 mg | Freq: Once | INTRAMUSCULAR | Status: AC
  Administered 2011-04-30: 30 mg via INTRAVENOUS
  Filled 2011-04-30: qty 1

## 2011-04-30 MED ORDER — FAMOTIDINE 20 MG PO TABS: 20.0000 mg | ORAL_TABLET | Freq: Two times a day (BID) | ORAL | Status: DC

## 2011-04-30 MED ORDER — IOHEXOL 350 MG/ML SOLN
80.0000 mL | Freq: Once | INTRAVENOUS | Status: AC | PRN
  Administered 2011-04-30: 80 mL via INTRAVENOUS

## 2011-04-30 MED ORDER — ASPIRIN 325 MG PO TABS
325.0000 mg | ORAL_TABLET | Freq: Once | ORAL | Status: AC
  Administered 2011-04-30: 325 mg via ORAL
  Filled 2011-04-30: qty 1

## 2011-04-30 MED ORDER — SODIUM CHLORIDE 0.9 % IV BOLUS (SEPSIS)
1000.0000 mL | Freq: Once | INTRAVENOUS | Status: AC
  Administered 2011-04-30: 1000 mL via INTRAVENOUS

## 2011-04-30 MED ORDER — LORAZEPAM 2 MG/ML IJ SOLN
1.0000 mg | Freq: Once | INTRAMUSCULAR | Status: AC
  Administered 2011-04-30: 1 mg via INTRAVENOUS
  Filled 2011-04-30: qty 1

## 2011-04-30 MED ORDER — LORAZEPAM 1 MG PO TABS: 1.0000 mg | ORAL_TABLET | Freq: Three times a day (TID) | ORAL | Status: AC | PRN

## 2011-04-30 MED ORDER — HYDROCODONE-ACETAMINOPHEN 5-500 MG PO TABS: 1.0000 | ORAL_TABLET | Freq: Four times a day (QID) | ORAL | Status: AC | PRN

## 2011-04-30 NOTE — ED Notes (Signed)
Pt brought to room via wheelchair.  Pt very restless and aggressive  Toward myself, EMT and wife.  Pt undressed and EKG obtained.

## 2011-04-30 NOTE — ED Provider Notes (Signed)
History     CSN: [id number] Arrival date & time: 04/30/2011 11:00 AM   First MD Initiated Contact with Patient 04/30/11 1119      Chief Complaint  Patient presents with  . Shortness of Breath    (Consider location/radiation/quality/duration/timing/severity/associated sxs/prior treatment) HPI Patient is a 35 yo M who presents complaining of left-sided chest pain radiating to his back for the past week with acute worsening over the past 2 days.  He was seen at an urgent care yesterday and had a CXR that was read as normal.  He was given antibiotics, prednisone, and breathing treatments.  He reports that albuterol did not help his symptoms at all at that time. Patient denies history of early family CAD, smoking, drug use, or personal or family thromboembolic disease.  The patient is on adderall and also has history of difficulty with drinking alcohol somewhat heavily.  He is hesitant to discuss this.  Patient denies other substance abuse and is very irritable. Past Medical History  Diagnosis Date  . Hypersomnia   . Rhinitis   . Anxiety     Past Surgical History  Procedure Date  . Toe surgery     Family History  Problem Relation Age of Onset  . Arthritis    . Depression Mother     History  Substance Use Topics  . Smoking status: Never Smoker   . Smokeless tobacco: Never Used  . Alcohol Use: Yes     daily      Review of Systems  Constitutional: Negative.   HENT: Negative.   Eyes: Negative.   Respiratory: Positive for chest tightness and shortness of breath.   Cardiovascular: Positive for chest pain.  Gastrointestinal: Negative.   Genitourinary: Negative.   Musculoskeletal: Negative.   Skin: Negative.   Neurological: Negative.   Hematological: Negative.   Psychiatric/Behavioral: Negative.   All other systems reviewed and are negative.    Allergies  Review of patient's allergies indicates no known allergies.  Home Medications   Current Outpatient Rx  Name  Route Sig Dispense Refill  . AMPHETAMINE-DEXTROAMPHET ER 20 MG PO CP24  1 daily 30 capsule 0  . AMPHETAMINE-DEXTROAMPHETAMINE 10 MG PO TABS  1-2 daily if needed 60 tablet 0  . CITALOPRAM HYDROBROMIDE 20 MG PO TABS  TAKE ONE TABLET BY MOUTH ONE TIME DAILY 30 tablet 5  . FAMOTIDINE 20 MG PO TABS Oral Take 1 tablet (20 mg total) by mouth 2 (two) times daily. 30 tablet 0  . HYDROCODONE-ACETAMINOPHEN 5-500 MG PO TABS Oral Take 1-2 tablets by mouth every 6 (six) hours as needed for pain. 15 tablet 0  . LORAZEPAM 1 MG PO TABS Oral Take 1 tablet (1 mg total) by mouth 3 (three) times daily as needed for anxiety. 15 tablet 0  . METHYLPHENIDATE HCL ER 20 MG PO TBCR Oral Take 20 mg by mouth every morning.      . METHYLPHENIDATE HCL 10 MG PO TABS  Take 1 to 2 tabs by mouth daily as needed 60 tablet 0  . PROTRIPTYLINE HCL 5 MG PO TABS Oral Take 1 tablet (5 mg total) by mouth at bedtime. 30 tablet 11    BP 117/70  Pulse 118  Temp(Src) 97.7 F (36.5 C) (Oral)  Resp 16  SpO2 100%  Physical Exam  Nursing note and vitals reviewed. Constitutional: He is oriented to person, place, and time. He appears well-developed and well-nourished. No distress.  HENT:  Head: Normocephalic and atraumatic.  Eyes: Conjunctivae and EOM  are normal. Pupils are equal, round, and reactive to light.  Neck: Normal range of motion.  Cardiovascular: Regular rhythm, normal heart sounds and intact distal pulses.  Tachycardia present.  Exam reveals no gallop and no friction rub.   No murmur heard. Pulmonary/Chest: Effort normal and breath sounds normal. No respiratory distress. He has no wheezes. He has no rales.  Abdominal: Soft. Bowel sounds are normal. He exhibits no distension. There is no tenderness.  Musculoskeletal: Normal range of motion.  Neurological: He is alert and oriented to person, place, and time. No cranial nerve deficit. He exhibits normal muscle tone. Coordination normal.  Skin: Skin is warm and dry. No rash  noted.  Psychiatric:       Very irritable    ED Course  Procedures (including critical care time)   Date: 04/30/2011  Rate: 123  Rhythm: sinus tachycardia  QRS Axis: normal  Intervals: normal  ST/T Wave abnormalities: normal  Conduction Disutrbances:incomplete RBBB  Narrative Interpretation:   Old EKG Reviewed: none available  Labs Reviewed  BASIC METABOLIC PANEL - Abnormal; Notable for the following:    Potassium 3.1 (*)    Glucose, Bld 105 (*)    All other components within normal limits  ETHANOL - Abnormal; Notable for the following:    Alcohol, Ethyl (B) 187 (*)    All other components within normal limits  D-DIMER, QUANTITATIVE - Abnormal; Notable for the following:    D-Dimer, Quant 0.88 (*)    All other components within normal limits  CBC  DIFFERENTIAL  PRO B NATRIURETIC PEPTIDE  TROPONIN I  URINE RAPID DRUG SCREEN (HOSP PERFORMED)  TROPONIN I   Ct Angio Chest W/cm &/or Wo Cm  04/30/2011  *RADIOLOGY REPORT*  Clinical Data:  Pulmonary embolism.  Cough and congestion for 3 weeks.  Chest pain.  CT ANGIOGRAPHY CHEST WITH CONTRAST  Technique:  Multidetector CT imaging of the chest was performed using the standard protocol during bolus administration of intravenous contrast.  Multiplanar CT image reconstructions including MIPs were obtained to evaluate the vascular anatomy.  Contrast: 80mL OMNIPAQUE IOHEXOL 350 MG/ML IV SOLN  Comparison:  04/30/2011.  Findings:  Technically adequate study for evaluation of pulmonary embolism.  No pulmonary embolism is present.  The heart, aorta and branch vessels are within normal limits.  Incidental imaging of the upper abdomen is within normal limits.  Bones appear normal. Persistent segmentation of the sternum.  Lungs appear within normal limits.  Review of the MIP images confirms the above findings.  IMPRESSION: Negative CT pulmonary angiogram.  No cardiopulmonary disease.  Original Report Authenticated By: Andreas Newport, M.D.   Dg Chest  Port 1 View  04/30/2011  *RADIOLOGY REPORT*  Clinical Data: Chest pain, atrial flutter, shortness of breath  PORTABLE CHEST - 1 VIEW  Comparison: None.  Findings: The cardiac silhouette, mediastinum, pulmonary vasculature are within normal limits.  Both lungs are clear. There is no acute bony abnormality.  IMPRESSION: There is no evidence of acute cardiac or pulmonary process.  Original Report Authenticated By: Brandon Melnick, M.D.     1. Chest pain       MDM  Patient was tachy to 130s on presentation.  He was very irritable to begin with.  Patient had CXR that was unremarkable as well as an ECG that only showed sinus tachycardia.  Given his vitals he had a work-up for possible cardiac causes as well as PE.  ASA was ordered.  Patient also had a UDS as he was  quite irritable.  Patient had no signs of infection and a negative TNI.  D-dimer was positive.  CT angio of the chest was performed and was negative.  With IVF patient's HR decreased to the 110-120 range.  Patient denies abuse of his adderrall.  Here he received some relief from ativan as well as a GI cocktail and toradol.  He declined any narcotic meds.  He had a 3 hour TNI that was also normal.  I contacted Dr. [name] from [facility] who prescribes the patient's adderall and is what the patient considers his most consistent physician.  Dr. [name] confirmed patient's difficulties with stress as well as prior issues with alcohol.  He reports that patient's HR has run between 115 and 130s at his office visits while on adderall.  Patient and I spoke at length about possible causes for his symptoms.  He was given vicodin 2 tabs.  Patient was discharged in good condition with Rx's for vicodin, a brief course of ativan and pepcid.  Patient was strongly encouraged to follow-up with a PCP.  He was discharged in improved condition.        Cyndra Numbers, MD 04/30/11 2146

## 2011-04-30 NOTE — ED Notes (Signed)
Pt c/o shortness of breath x 2 wks and "feeling heaviness on chest". Pt sts he went to urgent care yesterday and had "normal xray". Wife at bedside. Pt restless on stretcher with fists clenched. Pt denies drug use and admits to daily etoh use.

## 2011-05-01 ENCOUNTER — Telehealth: Payer: Self-pay | Admitting: Internal Medicine

## 2011-05-01 NOTE — Telephone Encounter (Signed)
04/30/11- Phone call- late entry. Dr Alto Denver at [facility] ER called to discuss. Recognize role of meds in keeping pulse elevated. Stress, ETOH, depression. Wife with him.

## 2011-05-04 ENCOUNTER — Telehealth: Payer: Self-pay | Admitting: Internal Medicine

## 2011-05-04 MED ORDER — PROMETHAZINE-CODEINE 6.25-10 MG/5ML PO SYRP: 5.0000 mL | ORAL_SOLUTION | ORAL | Status: AC | PRN

## 2011-05-04 NOTE — Telephone Encounter (Signed)
I returned his call. He began with a chest cold,  PCP Rx'd abx, steroid inj and Rx for prednisone(not taken). CXR was clear. Next day more sob. Went to [facility]- Dr Alto Denver had called me. SOB, light headed, burning in chest, increased with exertion. Had labs, CXR again clear. D-dimer elevated, but CT neg for PE. They gave ativan for anxiety, pain med for chest pain/ tightness, Rx for acid reflux which he denies.  Describes now 3 weeks of squeezing chest tightness, with burning now improved. Denies fever, purulent, blood, nodes, GI upset, swelling.  Imp- started as viral pattern, so bronchitis most likely. Pericarditis not seen. Plan - try symptom relief with cough syrup, while underlying condition either improves or declares itself.  Order- prometh codiene 200 ml, 1 tsp every 4 hours as needed

## 2011-05-04 NOTE — Telephone Encounter (Signed)
Spoke with pt.  He states only would like to speak with Dr [name] in reference to some of his medications. He did not wish to discuss this with me. I advised that [facility] is currently seeing pt's at this time and I will forward him msg to call pt.

## 2011-05-04 NOTE — Telephone Encounter (Signed)
Patient calling to check on status of prometh codiene rx.  [facility] [address]

## 2011-05-04 NOTE — Telephone Encounter (Signed)
Patient is aware that Rx for cough syrup has been call to [facility] [address] [facility].

## 2011-05-13 ENCOUNTER — Telehealth: Payer: Self-pay | Admitting: Internal Medicine

## 2011-05-14 MED ORDER — AMPHETAMINE-DEXTROAMPHET ER 20 MG PO CP24: ORAL_CAPSULE | ORAL | Status: DC

## 2011-05-14 MED ORDER — AMPHETAMINE-DEXTROAMPHETAMINE 10 MG PO TABS: ORAL_TABLET | ORAL | Status: DC

## 2011-05-14 NOTE — Telephone Encounter (Signed)
Rx's signed and mailed to patient as requested.

## 2011-06-09 ENCOUNTER — Ambulatory Visit: Payer: BC Managed Care – PPO | Admitting: Internal Medicine

## 2011-06-17 ENCOUNTER — Telehealth: Payer: Self-pay | Admitting: Internal Medicine

## 2011-06-17 MED ORDER — AMPHETAMINE-DEXTROAMPHET ER 20 MG PO CP24: ORAL_CAPSULE | ORAL | Status: DC

## 2011-06-17 MED ORDER — AMPHETAMINE-DEXTROAMPHETAMINE 10 MG PO TABS: ORAL_TABLET | ORAL | Status: DC

## 2011-06-17 NOTE — Telephone Encounter (Signed)
LMOM for pt to make aware that rx is up front for pick up

## 2011-06-17 NOTE — Telephone Encounter (Signed)
Last fill on 05-13-12, last OV on 12-09-2010. Rx printed and placed on CY look-at to sign. Carron Curie, CMA

## 2011-06-23 ENCOUNTER — Encounter: Payer: Self-pay | Admitting: Internal Medicine

## 2011-06-23 ENCOUNTER — Ambulatory Visit (INDEPENDENT_AMBULATORY_CARE_PROVIDER_SITE_OTHER): Payer: BC Managed Care – PPO | Admitting: Internal Medicine

## 2011-06-23 VITALS — BP 152/76 | HR 104 | Ht 69.0 in | Wt 166.4 lb

## 2011-06-23 DIAGNOSIS — J310 Chronic rhinitis: Secondary | ICD-10-CM

## 2011-06-23 DIAGNOSIS — G4710 Hypersomnia, unspecified: Secondary | ICD-10-CM

## 2011-06-23 DIAGNOSIS — J209 Acute bronchitis, unspecified: Secondary | ICD-10-CM

## 2011-06-23 MED ORDER — PROTRIPTYLINE HCL 5 MG PO TABS: 5.0000 mg | ORAL_TABLET | Freq: Every day | ORAL | Status: DC

## 2011-06-23 NOTE — Patient Instructions (Addendum)
Sample Tudorza  1 puff twice daily - see if it helps the chest tightness  Script sent for 3 month Vivactil  We can get something for muscle tension again like ativan if needed

## 2011-06-23 NOTE — Progress Notes (Signed)
Patient ID: Tommy Ellison, male    DOB: Aug 27, 1975, 36 y.o.   MRN: [id number]  HPI 10/10/10- 35 yoM followed for idiopathic hypersomnia, complicated by rhinitis. Last here January 21, 2010. Here today with 7 month baby.  Has felt more tired for several months, easier fatigued. Meds don't seem to energize him as before. Denies fever, chest pain, palpitation. Saw rheumatologist several months ago for hand and knee pain. Told Xrays ok, but "something in blood is up". Admits drinking more alcohol. Beer- 2 to 6 after work most days. But in the past month he has only had 3 beers. . Wife concerned. He went once to a primary physician 3 months ago and had labs drawn. We discussed AA and Behavioral Health.  12/09/10- 35 yoM followed for idiopathic hypersomnia, complicated by rhinitis Celexa is working well and making a difference. There was only modest improvement with change from ritalin to adderall. Overall he is feeling better about things. He has cut back substantially on his alcohol. Tried Nuvigil- wasn't strong enough. He thinks vivactil helps reduce his snoring. He had difficulty finding it for awhile and had to shop pharmacies, but doing better.   06/23/11- 35 yoM followed for idiopathic hypersomnia, complicated by rhinitis And he is here with his 17-year-old daughter. He still has an occasional rough night when he doesn't sleep well. Otherwise he is stable in his use of alert in medications. I do not get a history of uncontrolled daytime sleepiness, overstimulation, cataplexy or sleep paralysis. Ativan helped occasional restless night in the past, and we talked about whether to use it now. He has had some lingering chest tightness and easier DOE, better but not quite well. Questions mild sinus infection with maxillary pressure. For years has tended to epistaxis if he blows his nose hard. ENT has tended to hinm in the past. No other bleed or bruising.   ROS-see HPI Constitutional:   No-   weight loss,  night sweats, fevers, chills, fatigue, lassitude. HEENT:   + headaches,  No-difficulty swallowing, tooth/dental problems, sore throat,       No-  sneezing, itching, ear ache,  +nasal congestion, post nasal drip,  CV:  No-   chest pain, orthopnea, PND, swelling in lower extremities, anasarca, dizziness, palpitations Resp: Mild  shortness of breath with exertion or at rest.              No-   productive cough,  No non-productive cough,  No- coughing up of blood.              No-   change in color of mucus.  No- wheezing.   Skin: No-   rash or lesions. GI:  No-   heartburn, indigestion, abdominal pain, nausea, vomiting, diarrhea,                 change in bowel habits, loss of appetite GU:  MS:  No-   joint pain or swelling.  No- decreased range of motion.  No- back pain. Neuro-     nothing unusual Psych:  No- change in mood or affect. No depression or anxiety.  No memory loss.   Objective:   OBJ- Physical Exam General- Alert, Oriented, Affect-appropriate, Distress- none acute. Very good with his daughter. Skin- rash-none, lesions- none, excoriation- none Lymphadenopathy- none Head- atraumatic            Eyes- Gross vision intact, PERRLA, conjunctivae and secretions clear  Ears- Hearing, canals-normal            Nose- Clear, no-Septal dev, mucus, polyps, erosion, perforation             Throat- Mallampati II , mucosa clear , drainage- none, tonsils- atrophic Neck- flexible , trachea midline, no stridor , thyroid nl, carotid no bruit Chest - symmetrical excursion , unlabored           Heart/CV- RRR , no murmur , no gallop  , no rub, nl s1 s2                           - JVD- none , edema- none, stasis changes- none, varices- none           Lung- clear to P&A, wheeze- none, cough- none , dullness-none, rub- none           Chest wall-  Abd- Br/ Gen/ Rectal- Not done, not indicated Extrem- cyanosis- none, clubbing, none, atrophy- none, strength- nl Neuro- grossly intact to  observation

## 2011-06-23 NOTE — Assessment & Plan Note (Signed)
He is describing nasal congestion with unremarkable exam now. I don't think he has an active sinus infection and I really don't want to add much decongestant stimulation. We have discussed saline rinse.

## 2011-06-23 NOTE — Assessment & Plan Note (Signed)
We discussed his stimulant medications again. I also discussed the effect of Ativan used in the past, and consideration of alternatives. Healthy management of stress including good sleep hygiene and reasonable exercise should help.

## 2011-07-10 ENCOUNTER — Telehealth: Payer: Self-pay | Admitting: Internal Medicine

## 2011-07-10 MED ORDER — AMPHETAMINE-DEXTROAMPHETAMINE 10 MG PO TABS: ORAL_TABLET | ORAL | Status: DC

## 2011-07-10 MED ORDER — AMPHETAMINE-DEXTROAMPHET ER 20 MG PO CP24: ORAL_CAPSULE | ORAL | Status: DC

## 2011-07-10 NOTE — Telephone Encounter (Signed)
Called and spoke with pts wife and she is aware that rx has been placed in the mail to the pt.

## 2011-07-10 NOTE — Telephone Encounter (Signed)
Rx's have been printed off and placed on cdy cart for signature. Please advise thanks

## 2011-08-08 ENCOUNTER — Other Ambulatory Visit: Payer: Self-pay | Admitting: Internal Medicine

## 2011-08-11 ENCOUNTER — Telehealth: Payer: Self-pay | Admitting: Internal Medicine

## 2011-08-11 MED ORDER — ACLIDINIUM BROMIDE 400 MCG/ACT IN AEPB: 1.0000 | INHALATION_SPRAY | Freq: Two times a day (BID) | RESPIRATORY_TRACT | Status: DC

## 2011-08-11 MED ORDER — AMPHETAMINE-DEXTROAMPHET ER 20 MG PO CP24: ORAL_CAPSULE | ORAL | Status: DC

## 2011-08-11 MED ORDER — PROTRIPTYLINE HCL 5 MG PO TABS: 5.0000 mg | ORAL_TABLET | Freq: Every day | ORAL | Status: DC

## 2011-08-11 MED ORDER — AMPHETAMINE-DEXTROAMPHETAMINE 10 MG PO TABS: ORAL_TABLET | ORAL | Status: DC

## 2011-08-11 NOTE — Telephone Encounter (Signed)
Last OV: 05-2011  Next OV: due in 11-2011  Please advise if okay to refill. Thanks.

## 2011-08-11 NOTE — Telephone Encounter (Signed)
rx has been faxed to [facility] per pts request.    Pt is aware that these rx will be mailed to him.  Pt stated that the tudorza did help him a lot and an rx for this has been faxed to him as well.

## 2011-08-11 NOTE — Telephone Encounter (Signed)
Also requesting Rx for [place].  Tommy Ellison

## 2011-08-13 NOTE — Telephone Encounter (Signed)
Ok to keep this refilled

## 2011-09-11 ENCOUNTER — Telehealth: Payer: Self-pay | Admitting: Internal Medicine

## 2011-09-11 MED ORDER — AMPHETAMINE-DEXTROAMPHET ER 20 MG PO CP24: ORAL_CAPSULE | ORAL | Status: DC

## 2011-09-11 MED ORDER — AMPHETAMINE-DEXTROAMPHETAMINE 10 MG PO TABS: ORAL_TABLET | ORAL | Status: DC

## 2011-09-11 NOTE — Telephone Encounter (Signed)
Rxs printed and placed on cart to be signed. thanks

## 2011-09-14 NOTE — Telephone Encounter (Signed)
Left detailed message on voice mail informing pt that the requesting rx's have been placed in the mail for him this morning.  Encourage pt to call if he does not receive his scripts or with any other questions / concerns.  Will sign off.

## 2011-09-14 NOTE — Telephone Encounter (Signed)
rx signed and mailed 4/ lmovm pt aware .

## 2011-10-08 ENCOUNTER — Telehealth: Payer: Self-pay | Admitting: Internal Medicine

## 2011-10-08 MED ORDER — AMPHETAMINE-DEXTROAMPHETAMINE 10 MG PO TABS: ORAL_TABLET | ORAL | Status: DC

## 2011-10-08 MED ORDER — AMPHETAMINE-DEXTROAMPHET ER 20 MG PO CP24: ORAL_CAPSULE | ORAL | Status: DC

## 2011-10-08 NOTE — Telephone Encounter (Signed)
Left message for patient that we will send Rx's in mail Friday morning once signed by CY.

## 2011-10-09 NOTE — Telephone Encounter (Signed)
Spoke with pt aware rx signed and will be put in mail today . Nothing further was needed

## 2011-11-09 ENCOUNTER — Telehealth: Payer: Self-pay | Admitting: Internal Medicine

## 2011-11-09 MED ORDER — AMPHETAMINE-DEXTROAMPHETAMINE 10 MG PO TABS: ORAL_TABLET | ORAL | Status: DC

## 2011-11-09 MED ORDER — AMPHETAMINE-DEXTROAMPHET ER 20 MG PO CP24: ORAL_CAPSULE | ORAL | Status: DC

## 2011-11-09 NOTE — Telephone Encounter (Signed)
Rx printed and placed on CDY cart to be signed and mailed.

## 2011-11-09 NOTE — Telephone Encounter (Signed)
Mailed to patient's home as requested.

## 2011-12-01 ENCOUNTER — Telehealth: Payer: Self-pay | Admitting: Internal Medicine

## 2011-12-01 MED ORDER — PROTRIPTYLINE HCL 5 MG PO TABS: 5.0000 mg | ORAL_TABLET | Freq: Every day | ORAL | Status: DC

## 2011-12-01 MED ORDER — AMPHETAMINE-DEXTROAMPHET ER 20 MG PO CP24: ORAL_CAPSULE | ORAL | Status: DC

## 2011-12-01 MED ORDER — AMPHETAMINE-DEXTROAMPHETAMINE 10 MG PO TABS: ORAL_TABLET | ORAL | Status: DC

## 2011-12-01 MED ORDER — ACLIDINIUM BROMIDE 400 MCG/ACT IN AEPB: 1.0000 | INHALATION_SPRAY | Freq: Two times a day (BID) | RESPIRATORY_TRACT | Status: DC

## 2011-12-01 NOTE — Telephone Encounter (Signed)
RX's have been printed and placed on CD[facility] cart for signature and tudorza sent to [facility] mail [facility].

## 2011-12-01 NOTE — Telephone Encounter (Signed)
Left message for pt that all Rx's have been taken care of.

## 2011-12-01 NOTE — Telephone Encounter (Signed)
Pt called back.  He also needs a refill for protriptyline.  He uses the [facility] delivery pharm [facility]

## 2011-12-01 NOTE — Telephone Encounter (Signed)
RX's mailed as requested.

## 2011-12-15 ENCOUNTER — Ambulatory Visit: Payer: BC Managed Care – PPO | Admitting: Internal Medicine

## 2012-01-05 ENCOUNTER — Telehealth: Payer: Self-pay | Admitting: Internal Medicine

## 2012-01-05 DIAGNOSIS — G4710 Hypersomnia, unspecified: Secondary | ICD-10-CM

## 2012-01-05 MED ORDER — METHYLPHENIDATE HCL 10 MG PO TABS: ORAL_TABLET | ORAL | Status: DC

## 2012-01-05 MED ORDER — METHYLPHENIDATE HCL ER 20 MG PO TBCR: 20.0000 mg | EXTENDED_RELEASE_TABLET | ORAL | Status: DC

## 2012-01-05 NOTE — Telephone Encounter (Signed)
Mailed to patient's home as requested. Pt aware Rx's mailed.

## 2012-01-27 ENCOUNTER — Ambulatory Visit: Payer: BC Managed Care – PPO | Admitting: Internal Medicine

## 2012-02-09 ENCOUNTER — Telehealth: Payer: Self-pay | Admitting: Internal Medicine

## 2012-02-09 ENCOUNTER — Encounter: Payer: Self-pay | Admitting: *Deleted

## 2012-02-09 NOTE — Telephone Encounter (Signed)
Per CY-okay to refill; but remind patient to [facility] OV.

## 2012-02-09 NOTE — Telephone Encounter (Signed)
Patient last seen 06/23/11 and was to have follow-up in 6 months. There are no pending appts. Pls advise on prescriptions for adderall.

## 2012-02-09 NOTE — Telephone Encounter (Signed)
ATC the pt and NA, mailbox full, WCB tommorrow

## 2012-02-10 NOTE — Telephone Encounter (Signed)
ATC pt cell mailbox is full wcb. Called home # [email or web address] [phone]

## 2012-02-11 NOTE — Telephone Encounter (Signed)
Pt mailbox is still full on cell phone. lmomtcb on hom e#

## 2012-02-12 NOTE — Telephone Encounter (Signed)
Voicemail is full on cell number; called work and patient not there at this time; left message on patients home number to call us back-need to get patient scheduled and see if he wants to pick up rx or have mailed to home address.

## 2012-02-15 MED ORDER — AMPHETAMINE-DEXTROAMPHETAMINE 10 MG PO TABS: ORAL_TABLET | ORAL | Status: DC

## 2012-02-15 MED ORDER — AMPHETAMINE-DEXTROAMPHET ER 20 MG PO CP24: ORAL_CAPSULE | ORAL | Status: DC

## 2012-02-15 NOTE — Telephone Encounter (Signed)
Pt returned our call.  Call him back @ [phone]. Tommy Ellison

## 2012-02-15 NOTE — Telephone Encounter (Signed)
Spoke with pt and scheduled him for ov with CDY on 03/29/12 and verified that he would like to pick up rx tomorrow.  Rxs printed and placed on CDY's cart to be signed.

## 2012-02-15 NOTE — Telephone Encounter (Signed)
Rx's signed by CY and placed up front in the brown folder for pt to pick up at his convenience.  Pt is aware.

## 2012-03-10 ENCOUNTER — Telehealth: Payer: Self-pay | Admitting: Internal Medicine

## 2012-03-10 DIAGNOSIS — G4710 Hypersomnia, unspecified: Secondary | ICD-10-CM

## 2012-03-10 MED ORDER — AMPHETAMINE-DEXTROAMPHET ER 20 MG PO CP24: ORAL_CAPSULE | ORAL | Status: DC

## 2012-03-10 MED ORDER — AMPHETAMINE-DEXTROAMPHETAMINE 10 MG PO TABS: ORAL_TABLET | ORAL | Status: DC

## 2012-03-10 NOTE — Telephone Encounter (Signed)
Mailed to patient as requested.

## 2012-03-10 NOTE — Telephone Encounter (Signed)
Called, spoke with pt.  He would like rxs for adderall 10 mg and adderall 20 mg mailed to his home address.  I have verified this.    Adderall 10 mg rx last written on 02/15/12 for # 60 x 0 Adderall 20 mg rx last written on 02/15/12 for #30 x 0.  Pt's last OV with [facility] 06/23/11 and asked to f/u in 6 months. He does have a pending OV with CDY on 03/29/12.  Pt aware rxs will be printed for CDY to sign -- Katie, as I am out of [place] office, will you pls print both rxs for CDY to sign.  Thank you.

## 2012-03-29 ENCOUNTER — Ambulatory Visit (INDEPENDENT_AMBULATORY_CARE_PROVIDER_SITE_OTHER): Payer: BC Managed Care – PPO | Admitting: Internal Medicine

## 2012-03-29 ENCOUNTER — Encounter: Payer: Self-pay | Admitting: Internal Medicine

## 2012-03-29 VITALS — BP 106/82 | HR 105 | Ht 69.0 in | Wt 171.0 lb

## 2012-03-29 DIAGNOSIS — G4710 Hypersomnia, unspecified: Secondary | ICD-10-CM

## 2012-03-29 MED ORDER — AMPHETAMINE-DEXTROAMPHET ER 20 MG PO CP24: ORAL_CAPSULE | ORAL | Status: DC

## 2012-03-29 MED ORDER — AMPHETAMINE-DEXTROAMPHETAMINE 10 MG PO TABS: ORAL_TABLET | ORAL | Status: DC

## 2012-03-29 MED ORDER — ACLIDINIUM BROMIDE 400 MCG/ACT IN AEPB: 1.0000 | INHALATION_SPRAY | Freq: Two times a day (BID) | RESPIRATORY_TRACT | Status: DC

## 2012-03-29 MED ORDER — CITALOPRAM HYDROBROMIDE 20 MG PO TABS: 20.0000 mg | ORAL_TABLET | Freq: Every day | ORAL | Status: DC

## 2012-03-29 NOTE — Patient Instructions (Addendum)
Refilled Adderall 20 and 10 mg, Celexa, Tudorza      Please call as needed

## 2012-03-29 NOTE — Progress Notes (Signed)
Patient ID: Tommy Ellison, male    DOB: Oct 14, 1975, 36 y.o.   MRN: [id number]  HPI 10/10/10- 35 yoM followed for idiopathic hypersomnia, complicated by rhinitis. Last here January 21, 2010. Here today with 7 month baby.  Has felt more tired for several months, easier fatigued. Meds don't seem to energize him as before. Denies fever, chest pain, palpitation. Saw rheumatologist several months ago for hand and knee pain. Told Xrays ok, but "something in blood is up". Admits drinking more alcohol. Beer- 2 to 6 after work most days. But in the past month he has only had 3 beers. . Wife concerned. He went once to a primary physician 3 months ago and had labs drawn. We discussed AA and Behavioral Health.  12/09/10- 35 yoM followed for idiopathic hypersomnia, complicated by rhinitis Celexa is working well and making a difference. There was only modest improvement with change from ritalin to adderall. Overall he is feeling better about things. He has cut back substantially on his alcohol. Tried Nuvigil- wasn't strong enough. He thinks vivactil helps reduce his snoring. He had difficulty finding it for awhile and had to shop pharmacies, but doing better.   06/23/11- 35 yoM followed for idiopathic hypersomnia, complicated by rhinitis And he is here with his 38-year-old daughter. He still has an occasional rough night when he doesn't sleep well. Otherwise he is stable in his use of alert in medications. I do not get a history of uncontrolled daytime sleepiness, overstimulation, cataplexy or sleep paralysis. Ativan helped occasional restless night in the past, and we talked about whether to use it now. He has had some lingering chest tightness and easier DOE, better but not quite well. Questions mild sinus infection with maxillary pressure. For years has tended to epistaxis if he blows his nose hard. ENT has tended to hinm in the past. No other bleed or bruising.   03/29/12- 35 yoM followed for idiopathic  hypersomnia, complicated by rhinitis Pt states that he has been waking up more during the night than normal. He resumed Vivactil last year. 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 He takes Naprosyn for sleep. 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 He is not clear that this was voluntary. Averages 8 hours of sleep and gets up feeling more rested in the morning. He still depends on Adderall and Celexa. Now here with 2 Jadyn Brasher and active children. I'm again impressed with how patient he is with them.  ROS-see HPI Constitutional:   No-   weight loss, night sweats, fevers, chills, fatigue, lassitude. HEENT:   + headaches,  No-difficulty swallowing, tooth/dental problems, sore throat,       No-  sneezing, itching, ear ache,  +nasal congestion, post nasal drip,  CV:  No-   chest pain, orthopnea, PND, swelling in lower extremities, anasarca, dizziness, palpitations Resp: Mild  shortness of breath with exertion or at rest.              No-   productive cough,  No non-productive cough,  No- coughing up of blood.              No-   change in color of mucus.  No- wheezing.   Skin: No-   rash or lesions. GI:  No-   heartburn, indigestion, abdominal pain, nausea, vomiting,  GU:  MS:  +shoulder joint pain or swelling.   Neuro-  nothing unusual Psych:  No- change in mood or affect. No depression or anxiety.  No memory loss.  Objective:   OBJ- Physical Exam General- Alert, Oriented, Affect-appropriate, Distress- none acute.   +Diaphoretic Skin- rash-none, lesions- none, excoriation- none Lymphadenopathy- none Head- atraumatic            Eyes- Gross vision intact, PERRLA, conjunctivae and secretions clear            Ears- Hearing, canals-normal            Nose- Clear, no-Septal dev, mucus, polyps, erosion, perforation             Throat- Mallampati II , mucosa clear , drainage- none, tonsils- atrophic Neck-  flexible , trachea midline, no stridor , thyroid nl, carotid no bruit Chest - symmetrical excursion , unlabored           Heart/CV- RRR , no murmur , no gallop  , no rub, nl s1 s2                           - JVD- none , edema- none, stasis changes- none, varices- none           Lung- clear to P&A, wheeze- none, cough- none , dullness-none, rub- none           Chest wall-  Abd- Br/ Gen/ Rectal- Not done, not indicated Extrem- cyanosis- none, clubbing, none, atrophy- none, strength- nl Neuro- grossly intact to observation

## 2012-04-10 NOTE — Assessment & Plan Note (Signed)
Sleep hygiene appears adequate. His use of stimulant adderall has not changed, with no concerns of inappropriate use or diversion and no significant side-effects.

## 2012-05-13 ENCOUNTER — Telehealth: Payer: Self-pay | Admitting: Internal Medicine

## 2012-05-13 MED ORDER — AMPHETAMINE-DEXTROAMPHET ER 20 MG PO CP24: ORAL_CAPSULE | ORAL | Status: DC

## 2012-05-13 MED ORDER — AMPHETAMINE-DEXTROAMPHETAMINE 10 MG PO TABS: ORAL_TABLET | ORAL | Status: DC

## 2012-05-13 NOTE — Telephone Encounter (Signed)
Printed rx's for CY to sign.

## 2012-05-13 NOTE — Telephone Encounter (Signed)
Rx have been signed, will place them in the mail today.

## 2012-05-16 ENCOUNTER — Telehealth: Payer: Self-pay | Admitting: Internal Medicine

## 2012-05-16 DIAGNOSIS — F418 Other specified anxiety disorders: Secondary | ICD-10-CM

## 2012-05-16 NOTE — Telephone Encounter (Signed)
LMTCB

## 2012-05-17 NOTE — Telephone Encounter (Signed)
I spoke with pt and he is wanting to know if Dr. [name] will refer him to [facility]. He states they will not accept him without a referral. Please advise thanks

## 2012-05-17 NOTE — Telephone Encounter (Signed)
Returning call can be reached at [phone].Tommy Ellison

## 2012-05-17 NOTE — Telephone Encounter (Signed)
lmomtcb x2 for pt 

## 2012-05-17 NOTE — Telephone Encounter (Signed)
Order- [facility] referral to [facility] for family counseling at patient request

## 2012-05-17 NOTE — Telephone Encounter (Signed)
Order placed, spoke with patient and made him aware.  Nothing further needed at this time.

## 2012-05-31 ENCOUNTER — Telehealth: Payer: Self-pay | Admitting: Internal Medicine

## 2012-05-31 NOTE — Telephone Encounter (Signed)
Spoke with patients wife-aware to call [facility] to [facility] appt.

## 2012-05-31 NOTE — Telephone Encounter (Signed)
LMTCB-need to let patient's wife know(dpr on file for Brannan Cassedy (spouse)) that there was a misunderstanding from [facility] to our office(we were told they would call the patient to [facility] missed appt from 05-16-12); I spoke with rep at [facility] that states if the patient will call them they will [facility] appt with patient while on phone. Sorry for the mishap.

## 2012-05-31 NOTE — Telephone Encounter (Signed)
Pt's spouse returned call. Tommy Ellison  °

## 2012-05-31 NOTE — Telephone Encounter (Signed)
LMTCB

## 2012-06-13 ENCOUNTER — Telehealth: Payer: Self-pay | Admitting: Internal Medicine

## 2012-06-13 MED ORDER — AMPHETAMINE-DEXTROAMPHETAMINE 10 MG PO TABS: ORAL_TABLET | ORAL | Status: DC

## 2012-06-13 MED ORDER — AMPHETAMINE-DEXTROAMPHET ER 20 MG PO CP24: ORAL_CAPSULE | ORAL | Status: DC

## 2012-06-13 NOTE — Telephone Encounter (Signed)
I spoke with pt. Requesting adderall 10 and 20 mg to be mailed to him. I advised will print out and have CDY sign RX's. Once this si done will place int he mail to him. He voiced his understanding. Please advise Dr. [name] thanks Last OV 03/2012 Told to f/u 1 year

## 2012-06-13 NOTE — Telephone Encounter (Signed)
rx signed and placed in mail.

## 2012-07-05 ENCOUNTER — Other Ambulatory Visit: Payer: Self-pay | Admitting: Internal Medicine

## 2012-07-05 NOTE — Telephone Encounter (Signed)
Please advise if okay to refill. Thanks.  

## 2012-07-05 NOTE — Telephone Encounter (Signed)
Ok to refill vivactil

## 2012-07-14 ENCOUNTER — Telehealth: Payer: Self-pay | Admitting: Internal Medicine

## 2012-07-14 MED ORDER — AMPHETAMINE-DEXTROAMPHETAMINE 10 MG PO TABS: ORAL_TABLET | ORAL | Status: DC

## 2012-07-14 MED ORDER — AMPHETAMINE-DEXTROAMPHET ER 20 MG PO CP24: ORAL_CAPSULE | ORAL | Status: DC

## 2012-07-14 NOTE — Telephone Encounter (Signed)
Pt returned call. Kathleen W Perdue  

## 2012-07-14 NOTE — Telephone Encounter (Signed)
This has been placed in mail for patient.

## 2012-07-14 NOTE — Telephone Encounter (Signed)
Rx printed and left for CY to sign.  Notify pt when rx is mailed.

## 2012-07-14 NOTE — Telephone Encounter (Signed)
lmomtcb x1  Last OV 03/29/12 with CY Next Ov 03/28/13 with CY

## 2012-08-15 ENCOUNTER — Telehealth: Payer: Self-pay | Admitting: Internal Medicine

## 2012-08-15 NOTE — Telephone Encounter (Signed)
ATC patient x2. Both times phone answered but no one ever came on the phone. WCB

## 2012-08-16 NOTE — Telephone Encounter (Signed)
lmtcb x1 for pt. 

## 2012-08-17 MED ORDER — AMPHETAMINE-DEXTROAMPHETAMINE 10 MG PO TABS: ORAL_TABLET | ORAL | Status: DC

## 2012-08-17 MED ORDER — AMPHETAMINE-DEXTROAMPHET ER 20 MG PO CP24: ORAL_CAPSULE | ORAL | Status: DC

## 2012-08-17 NOTE — Telephone Encounter (Signed)
Per Florentina Addison, she will have Dr. [name] sign and place in mail today. Pt aware.

## 2012-08-17 NOTE — Telephone Encounter (Signed)
Last OV with Dr. [name] 211/5/13; asked to f/u in 1 yr Pending OV with Dr. [name] 03/28/13 Adderall XR 20 mg 1 po qd # 30 x 0 rx last given on 07/14/12 Adderal 10 mg 1-2 qd prn rx last given #60 x 0 on 07/14/12  Called, spoke with pt.  Verified rxs needed and mailing address with pt.   Rxs printed and placed on CDY's cart for signature along with envelope.

## 2012-09-16 ENCOUNTER — Telehealth: Payer: Self-pay | Admitting: Internal Medicine

## 2012-09-16 DIAGNOSIS — G4710 Hypersomnia, unspecified: Secondary | ICD-10-CM

## 2012-09-16 NOTE — Telephone Encounter (Signed)
Called, spoke with pt. States he usually switches from Adderall to Ritalin every year. He is currently on Adderall but will go to the Ritalin when he finishes the Adderall. Pt states he is currently with no insurance. He isn't able to get the Extended Release medications right now because they are more expensive.   Would like to know if Dr. [name] can increase the amount on the Ritalin 10 mg tablets because of this. Dr. [name], pls advise.  Thank you.  Last OV with CDY:  03/29/12; asked to f/u 1 yr Pending OV with CDY 03/28/13  ** Pt aware CDY is out of office this evening.  He is ok with call back on Monday regarding this.

## 2012-09-18 NOTE — Telephone Encounter (Signed)
Ok to give methylphenidate 10 mg, # 90, 1 tab up to 3 times daily as needed. No refill.

## 2012-09-19 MED ORDER — METHYLPHENIDATE HCL 10 MG PO TABS: ORAL_TABLET | ORAL | Status: DC

## 2012-09-19 NOTE — Telephone Encounter (Signed)
Rx was printed and placed on CDY's cart with envelope attached  LMOM for the pt to be made aware

## 2012-10-18 ENCOUNTER — Telehealth: Payer: Self-pay | Admitting: Internal Medicine

## 2012-10-18 DIAGNOSIS — G4710 Hypersomnia, unspecified: Secondary | ICD-10-CM

## 2012-10-18 MED ORDER — METHYLPHENIDATE HCL 10 MG PO TABS: ORAL_TABLET | ORAL | Status: DC

## 2012-10-18 NOTE — Telephone Encounter (Signed)
Last OV on 115-13, next rov 1 year. Last refill on 09-19-12 for #90. rx printed and palced at [facility] cart to sign. Pt aware rx has been mailed. Carron Curie, CMA

## 2012-11-17 ENCOUNTER — Telehealth: Payer: Self-pay | Admitting: Internal Medicine

## 2012-11-17 DIAGNOSIS — G4710 Hypersomnia, unspecified: Secondary | ICD-10-CM

## 2012-11-17 MED ORDER — METHYLPHENIDATE HCL 10 MG PO TABS: ORAL_TABLET | ORAL | Status: DC

## 2012-11-17 NOTE — Telephone Encounter (Signed)
rx has been printed out and placed on CY cart to be signed and we will place in the mail to the pt.

## 2012-12-21 ENCOUNTER — Telehealth: Payer: Self-pay | Admitting: Internal Medicine

## 2012-12-21 DIAGNOSIS — G4710 Hypersomnia, unspecified: Secondary | ICD-10-CM

## 2012-12-22 MED ORDER — METHYLPHENIDATE HCL 10 MG PO TABS: ORAL_TABLET | ORAL | Status: DC

## 2012-12-22 NOTE — Telephone Encounter (Signed)
RX signed, printed, and placed in mail.

## 2013-01-26 ENCOUNTER — Telehealth: Payer: Self-pay | Admitting: Internal Medicine

## 2013-01-26 MED ORDER — AMPHETAMINE-DEXTROAMPHETAMINE 10 MG PO TABS: ORAL_TABLET | ORAL | Status: DC

## 2013-01-26 NOTE — Telephone Encounter (Signed)
Mailed to patient as requested.

## 2013-01-26 NOTE — Telephone Encounter (Signed)
RX has been printed out and placed on CDY cart for signature. Please advise thanks

## 2013-01-30 ENCOUNTER — Telehealth: Payer: Self-pay | Admitting: Internal Medicine

## 2013-01-30 NOTE — Telephone Encounter (Signed)
I spoke with pt. Advised him it was not sent to out going mail 01/26/13 at 4:38. This would have not been sent out to him until 01/27/13. He was calling and to make sure it was sent. Nothing further needed

## 2013-02-17 ENCOUNTER — Telehealth: Payer: Self-pay | Admitting: Internal Medicine

## 2013-02-17 DIAGNOSIS — G4710 Hypersomnia, unspecified: Secondary | ICD-10-CM

## 2013-02-17 NOTE — Telephone Encounter (Signed)
lmomtcb x1 for pt. We show the ritalin 10 mg was written for #90.

## 2013-02-20 MED ORDER — METHYLPHENIDATE HCL 10 MG PO TABS: ORAL_TABLET | ORAL | Status: DC

## 2013-02-20 NOTE — Telephone Encounter (Signed)
ATC patient, no answer LMOTMCB

## 2013-02-20 NOTE — Telephone Encounter (Signed)
I spoke with pt. He is wanting to pick up RX for ritalin 10 mg #90. He stated when he picked this up from the pharmacy he was giving #60. He stated then it was a mistake on his behalf. RX printed and will placed on CDY cart for signature. Will forward to [place] once ready

## 2013-02-20 NOTE — Telephone Encounter (Signed)
I spoke with patient; he would like to pick up RX. Pt aware that RX at front for pick up.

## 2013-02-20 NOTE — Telephone Encounter (Signed)
Rx signed by CY and mailed to patient as requested.  

## 2013-03-21 ENCOUNTER — Telehealth: Payer: Self-pay | Admitting: Internal Medicine

## 2013-03-21 DIAGNOSIS — G4710 Hypersomnia, unspecified: Secondary | ICD-10-CM

## 2013-03-21 MED ORDER — METHYLPHENIDATE HCL 10 MG PO TABS: ORAL_TABLET | ORAL | Status: DC

## 2013-03-21 NOTE — Telephone Encounter (Signed)
Pt aware that Rx at front for pick up and I gave reminder of his appt on 03-28-13 at 3:30pm.

## 2013-03-28 ENCOUNTER — Ambulatory Visit (INDEPENDENT_AMBULATORY_CARE_PROVIDER_SITE_OTHER): Payer: Self-pay | Admitting: Internal Medicine

## 2013-03-28 ENCOUNTER — Encounter: Payer: Self-pay | Admitting: Internal Medicine

## 2013-03-28 VITALS — BP 126/74 | HR 112 | Ht 69.0 in | Wt 170.4 lb

## 2013-03-28 DIAGNOSIS — G4710 Hypersomnia, unspecified: Secondary | ICD-10-CM

## 2013-03-28 NOTE — Patient Instructions (Signed)
We can continue current meds  I can look up the name of a good book for back problems and we can call it to you

## 2013-03-28 NOTE — Progress Notes (Signed)
Patient ID: Tommy Ellison, male    DOB: 1975/09/08, 38 y.o.   MRN: [id number]  HPI 10/10/10- 35 yoM followed for idiopathic hypersomnia, complicated by rhinitis. Last here January 21, 2010. Here today with 7 month baby.  Has felt more tired for several months, easier fatigued. Meds don't seem to energize him as before. Denies fever, chest pain, palpitation. Saw rheumatologist several months ago for hand and knee pain. Told Xrays ok, but "something in blood is up". Admits drinking more alcohol. Beer- 2 to 6 after work most days. But in the past month he has only had 3 beers. . Wife concerned. He went once to a primary physician 3 months ago and had labs drawn. We discussed AA and Behavioral Health.  12/09/10- 35 yoM followed for idiopathic hypersomnia, complicated by rhinitis Celexa is working well and making a difference. There was only modest improvement with change from ritalin to adderall. Overall he is feeling better about things. He has cut back substantially on his alcohol. Tried Nuvigil- wasn't strong enough. He thinks vivactil helps reduce his snoring. He had difficulty finding it for awhile and had to shop pharmacies, but doing better.   06/23/11- 35 yoM followed for idiopathic hypersomnia, complicated by rhinitis And he is here with his 17-year-old daughter. He still has an occasional rough night when he doesn't sleep well. Otherwise he is stable in his use of alert in medications. I do not get a history of uncontrolled daytime sleepiness, overstimulation, cataplexy or sleep paralysis. Ativan helped occasional restless night in the past, and we talked about whether to use it now. He has had some lingering chest tightness and easier DOE, better but not quite well. Questions mild sinus infection with maxillary pressure. For years has tended to epistaxis if he blows his nose hard. ENT has tended to hinm in the past. No other bleed or bruising.   03/29/12- 35 yoM followed for idiopathic  hypersomnia, complicated by rhinitis Pt states that he has been waking up more during the night than normal. He resumed Vivactil last year. 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 He takes Naprosyn for sleep. 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 He is not clear that this was voluntary. Averages 8 hours of sleep and gets up feeling more rested in the morning. He still depends on Adderall and Celexa. Now here with 2 young and active children. I'm again impressed with how patient he is with them.  03/28/13- 37 yoM followed for idiopathic hypersomnia, complicated by rhinitis FOLLOWS FOR: recent back issues-this is causing him to be awake more at night. Other than night it happens randomly. He declines flu vaccine-discussed.  He is doing Aeronautical engineer work. Has back trouble-disc-disturbing his sleep and he blames this for some daytime sleepiness. He switches between Adderall and Ritalin every few months to control tolerance. We reviewed sleep hygiene. He has continued protriptyline which has antidepressant and REM suppressing capacities. We have used it at night, recognizing that can be mildly stimulating, but it did seem to help him.  ROS-see HPI Constitutional:   No-   weight loss, night sweats, fevers, chills, +fatigue, lassitude. HEENT:   + headaches,  No-difficulty swallowing, tooth/dental problems, sore throat,       No-  sneezing, itching, ear ache,  +nasal congestion, post nasal drip,  CV:  No-   chest pain, orthopnea, PND, swelling in lower extremities, anasarca,  dizziness, palpitations Resp: Mild  shortness of breath with exertion or at rest.              No-   productive cough,  No non-productive cough,  No- coughing up of blood.              No-   change in color of mucus.  No- wheezing.   Skin: No-   rash or lesions. GI:  No-   heartburn, indigestion, abdominal pain, nausea, vomiting,  GU:  MS:   +shoulder joint pain or swelling.   Neuro-     nothing unusual Psych:  No- change in mood or affect. No depression or anxiety.  No memory loss.  Objective:   OBJ- Physical Exam General- Alert, Oriented, Affect-appropriate, Distress- none acute.    Skin- rash-none, lesions- none, excoriation- none Lymphadenopathy- none Head- atraumatic            Eyes- Gross vision intact, PERRLA, conjunctivae and secretions clear            Ears- Hearing, canals-normal            Nose- Clear, no-Septal dev, mucus, polyps, erosion, perforation             Throat- Mallampati II , mucosa clear , drainage- none, tonsils- atrophic Neck- flexible , trachea midline, no stridor , thyroid nl, carotid no bruit Chest - symmetrical excursion , unlabored           Heart/CV- RRR , no murmur , no gallop  , no rub, nl s1 s2                           - JVD- none , edema- none, stasis changes- none, varices- none           Lung- clear to P&A, wheeze- none, cough- none , dullness-none, rub- none           Chest wall-  Abd- Br/ Gen/ Rectal- Not done, not indicated Extrem- cyanosis- none, clubbing, none, atrophy- none, strength- nl Neuro- grossly intact to observation, no tremor

## 2013-04-11 NOTE — Assessment & Plan Note (Signed)
We discussed sleep hygiene again. Important that he not try to substitute stimulant medication for adequate sleep

## 2013-04-24 ENCOUNTER — Telehealth: Payer: Self-pay | Admitting: Internal Medicine

## 2013-04-24 DIAGNOSIS — G4710 Hypersomnia, unspecified: Secondary | ICD-10-CM

## 2013-04-24 MED ORDER — METHYLPHENIDATE HCL 10 MG PO TABS: ORAL_TABLET | ORAL | Status: DC

## 2013-04-24 NOTE — Telephone Encounter (Signed)
Spoke with patient-aware that Rx at front for pick up.

## 2013-04-24 NOTE — Telephone Encounter (Signed)
I called and spoke with pt. He is requesting to p/u the ritalin 10 mg. I advised pt will print off and have CDY sign. Pt will pick this up later. Please advise once done thanks

## 2013-05-19 ENCOUNTER — Telehealth: Payer: Self-pay | Admitting: Internal Medicine

## 2013-05-19 MED ORDER — AMPHETAMINE-DEXTROAMPHETAMINE 10 MG PO TABS: ORAL_TABLET | ORAL | Status: DC

## 2013-05-19 NOTE — Telephone Encounter (Signed)
Rx printed, signed and placed in mail.

## 2013-05-19 NOTE — Telephone Encounter (Signed)
Rx was last refilled to pt 04/24/13 #90 x 0 refills Last OV 03/28/13 No pending appt.  Please advise Dr. [name] if okay to refill? thanks

## 2013-06-06 ENCOUNTER — Telehealth: Payer: Self-pay | Admitting: Internal Medicine

## 2013-06-06 MED ORDER — AMPHETAMINE-DEXTROAMPHETAMINE 10 MG PO TABS: ORAL_TABLET | ORAL | Status: DC

## 2013-06-06 NOTE — Telephone Encounter (Signed)
Pt requesting refill on adderall 10mg .  This rx has been printed and placed on Tommy Ellison's desk for approval.

## 2013-06-06 NOTE — Telephone Encounter (Signed)
This rx has been signed and placed to mailed.  Pt is aware. Nothing further is needed

## 2013-06-12 ENCOUNTER — Telehealth: Payer: Self-pay | Admitting: Pulmonary Disease

## 2013-06-12 NOTE — Telephone Encounter (Signed)
Spoke with pharmacy-Rx was given to them as #90 and should be #60 according to previous Rx's; gave verbal order to change at pharmacy.

## 2013-06-19 ENCOUNTER — Encounter: Payer: Self-pay | Admitting: Internal Medicine

## 2013-06-28 ENCOUNTER — Telehealth: Payer: Self-pay | Admitting: Internal Medicine

## 2013-06-28 DIAGNOSIS — G4710 Hypersomnia, unspecified: Secondary | ICD-10-CM

## 2013-06-28 MED ORDER — METHYLPHENIDATE HCL 10 MG PO TABS: ORAL_TABLET | ORAL | Status: DC

## 2013-06-28 NOTE — Telephone Encounter (Signed)
Pt aware that Rx has been placed in mail.

## 2013-06-28 NOTE — Telephone Encounter (Signed)
Noted  

## 2013-06-28 NOTE — Telephone Encounter (Signed)
Last OV with CY: 03/28/13; asked to f/u in 1 yr Methylphenidate 10 mg rx was last given on 04/24/13 take 1-3 daily prn #90 x 0 I verified requested rx and home mailing address.  Pt aware we will call once mailed.

## 2013-07-04 ENCOUNTER — Other Ambulatory Visit: Payer: Self-pay | Admitting: Internal Medicine

## 2013-07-28 ENCOUNTER — Telehealth: Payer: Self-pay | Admitting: Internal Medicine

## 2013-07-28 MED ORDER — AMPHETAMINE-DEXTROAMPHETAMINE 10 MG PO TABS: 10.0000 mg | ORAL_TABLET | Freq: Three times a day (TID) | ORAL | Status: DC

## 2013-07-28 NOTE — Telephone Encounter (Signed)
Ok to refill 

## 2013-07-28 NOTE — Telephone Encounter (Signed)
Pt aware that Rx has been mailed to his confirmed mailing address.

## 2013-07-28 NOTE — Telephone Encounter (Signed)
Last OV 03/28/13 No pending OV - to follow up in 03/2014 Last rx 06/19/13  CY - please advise. Thanks.

## 2013-08-30 ENCOUNTER — Telehealth: Payer: Self-pay | Admitting: Internal Medicine

## 2013-08-30 DIAGNOSIS — G4710 Hypersomnia, unspecified: Secondary | ICD-10-CM

## 2013-08-30 MED ORDER — METHYLPHENIDATE HCL 10 MG PO TABS: ORAL_TABLET | ORAL | Status: DC

## 2013-08-30 NOTE — Telephone Encounter (Signed)
Last ritalin 10 mg rx mailed 06/28/13 #90 with no refills  Pt aware we will mail once signed

## 2013-09-26 ENCOUNTER — Telehealth: Payer: Self-pay | Admitting: Internal Medicine

## 2013-09-26 DIAGNOSIS — G4710 Hypersomnia, unspecified: Secondary | ICD-10-CM

## 2013-09-26 NOTE — Telephone Encounter (Signed)
Last OV 03/28/13 No pending OV at this time Last fill 08/30/13 #90  CY - please advise on refill. Thanks.

## 2013-09-27 MED ORDER — METHYLPHENIDATE HCL 10 MG PO TABS: ORAL_TABLET | ORAL | Status: DC

## 2013-09-27 NOTE — Telephone Encounter (Signed)
Ok to refill 

## 2013-09-27 NOTE — Telephone Encounter (Signed)
Rx printed for CDY to sign Envelope attached--Rx to be mailed once signed.  Detailed message left on machine informing patient that Rx being mailed.

## 2013-10-01 ENCOUNTER — Other Ambulatory Visit: Payer: Self-pay | Admitting: Internal Medicine

## 2013-10-02 NOTE — Telephone Encounter (Signed)
CY, Please advise if okay to refill. Thanks.  

## 2013-10-02 NOTE — Telephone Encounter (Signed)
Ok to refill 

## 2013-10-30 ENCOUNTER — Telehealth: Payer: Self-pay | Admitting: Internal Medicine

## 2013-10-30 DIAGNOSIS — G4710 Hypersomnia, unspecified: Secondary | ICD-10-CM

## 2013-10-30 MED ORDER — METHYLPHENIDATE HCL 10 MG PO TABS: ORAL_TABLET | ORAL | Status: DC

## 2013-10-30 NOTE — Telephone Encounter (Signed)
Called spoke with pt. Confirmed RX. This has been printed off for CDY to sign. Pt wants to pick up. No call back needed. Just placed upfront once done. thanks

## 2013-11-22 ENCOUNTER — Telehealth: Payer: Self-pay | Admitting: Internal Medicine

## 2013-11-22 DIAGNOSIS — G4710 Hypersomnia, unspecified: Secondary | ICD-10-CM

## 2013-11-22 MED ORDER — METHYLPHENIDATE HCL 10 MG PO TABS: ORAL_TABLET | ORAL | Status: DC

## 2013-11-22 NOTE — Telephone Encounter (Signed)
Called and spoke with pt and he is requesting a  Refill of the ritalin 10 mg to be mailed to him.  Last rx given to the pt on 10/30/13 for #90.  CY please advise if ok to mail this to the pt.    Last ov--03/28/13 No pending appts  No Known Allergies  Current Outpatient Prescriptions on File Prior to Visit  Medication Sig Dispense Refill  . amphetamine-dextroamphetamine (ADDERALL) 10 MG tablet Take 1 tablet (10 mg total) by mouth 3 (three) times daily.  90 tablet  0  . citalopram (CELEXA) 20 MG tablet TAKE 1 TABLET BY MOUTH ONCE DAILY.  90 tablet  0  . methylphenidate (RITALIN) 10 MG tablet Take 1 to 3 tabs by mouth daily as needed  90 tablet  0  . Multiple Vitamin (MULTIVITAMIN) tablet Take 1 tablet by mouth daily.      . protriptyline (VIVACTIL) 5 MG tablet TAKE 1 TABLET AT BEDTIME  90 tablet  2   No current facility-administered medications on file prior to visit.

## 2013-11-22 NOTE — Telephone Encounter (Signed)
Ok to refill as requested. Please remind him I need to see him once a year

## 2013-11-22 NOTE — Telephone Encounter (Signed)
rx has been printed out and placed on CY cart to be signed.  Will mail to the pt per his request once done.

## 2013-11-26 IMAGING — CT CT ANGIO CHEST
2 of 6 series · 19 of 36 positions shown · IV contrast (APPLIED)
Comparison: 04/30/2011.

CLINICAL DATA: Pulmonary embolism.  Cough and congestion for 3
weeks.  Chest pain.

CT ANGIOGRAPHY CHEST WITH CONTRAST
TECHNIQUE: Multidetector CT imaging of the chest was performed
using the standard protocol during bolus administration of
intravenous contrast.  Multiplanar CT image reconstructions
including MIPs were obtained to evaluate the vascular anatomy.
Contrast: 80mL OMNIPAQUE IOHEXOL 350 MG/ML IV SOLN

[Series 5: pe 1.0 b25f · axial · 0.69mm/px · z∈[-221,+40]mm · 18 of 291 slices shown]
[im 15/291  lung]
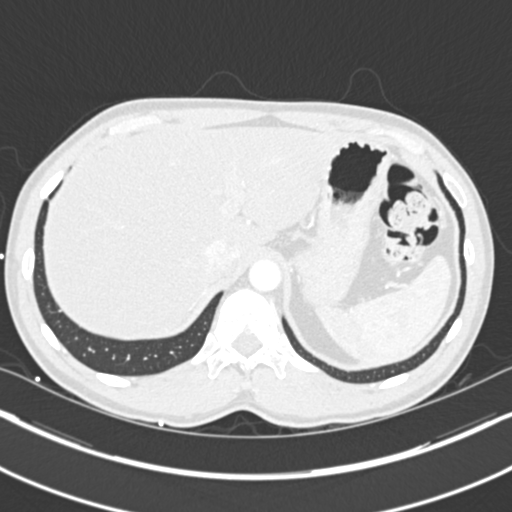
[im 30/291  mediastinal]
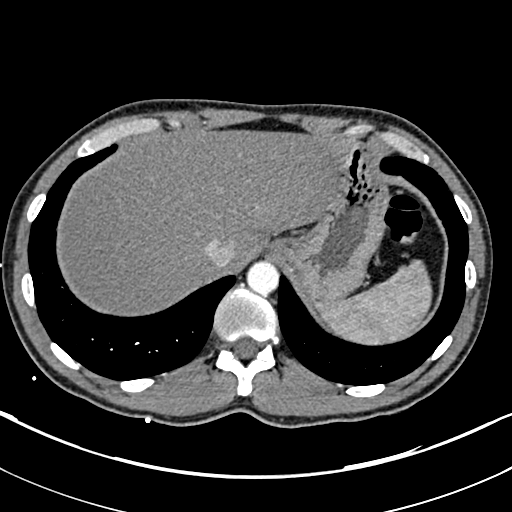
[im 44/291  lung]
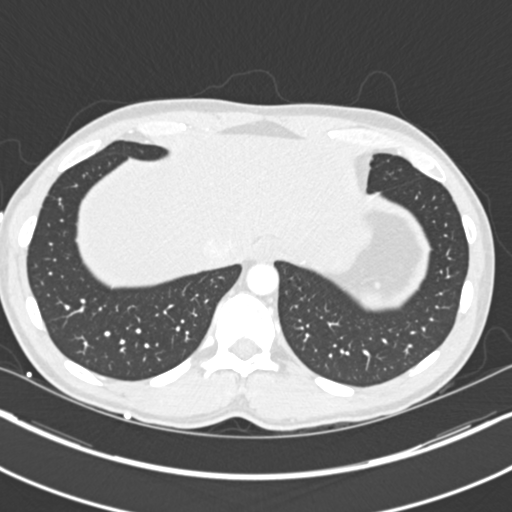
[im 59/291  mediastinal]
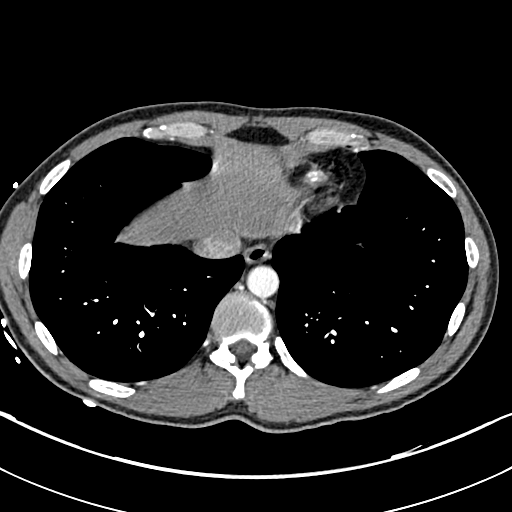
[im 73/291  lung]
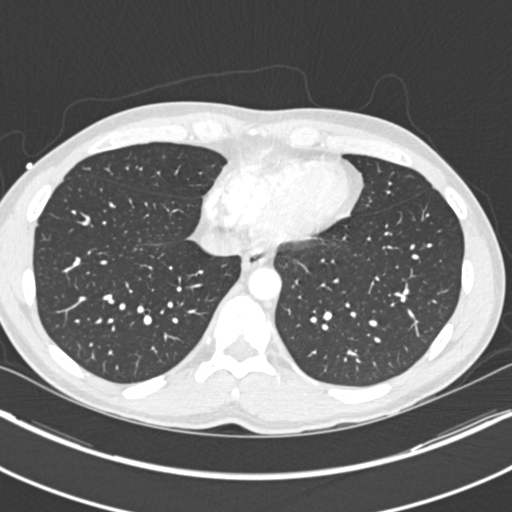
[im 88/291  mediastinal]
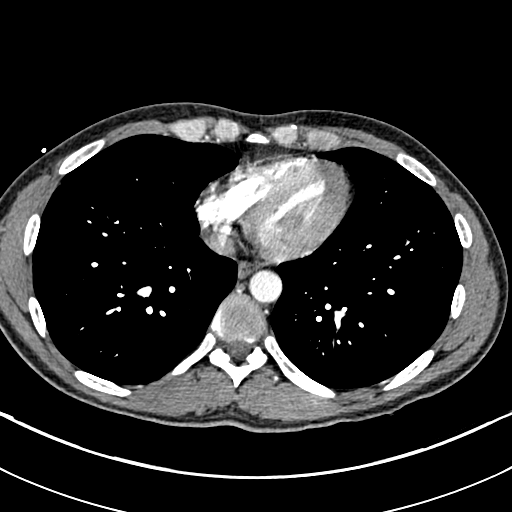
[im 102/291  lung]
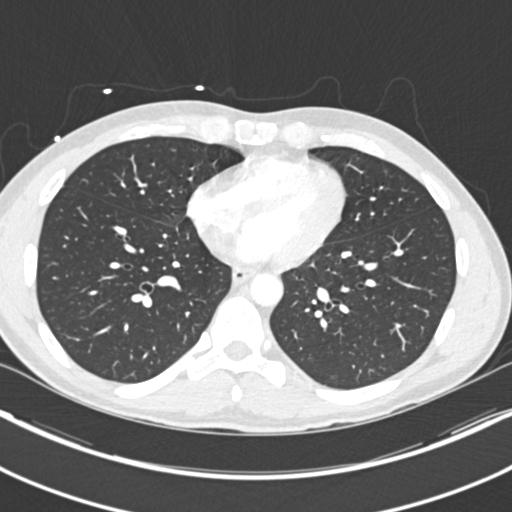
[im 117/291  mediastinal]
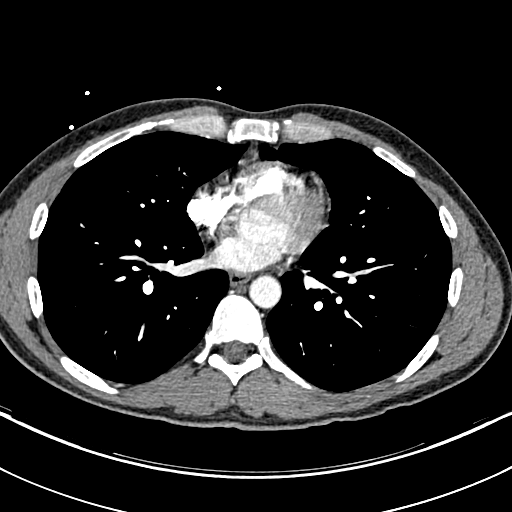
[im 131/291  lung]
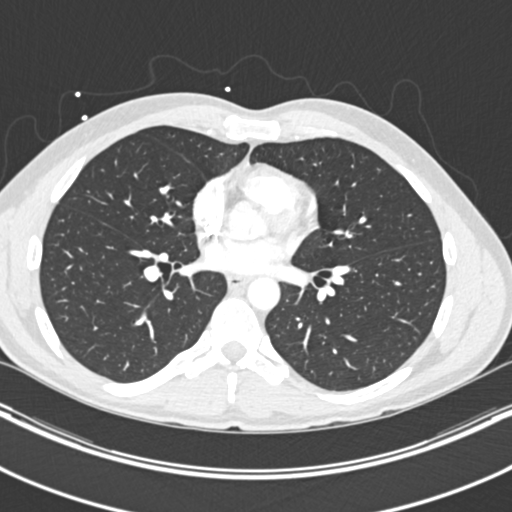
[im 160/291  mediastinal]
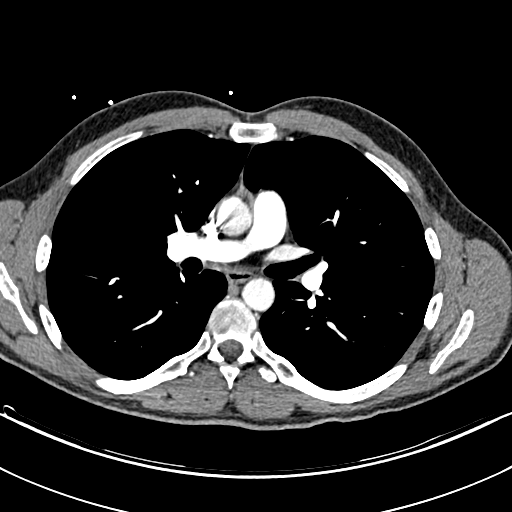
[im 175/291  lung]
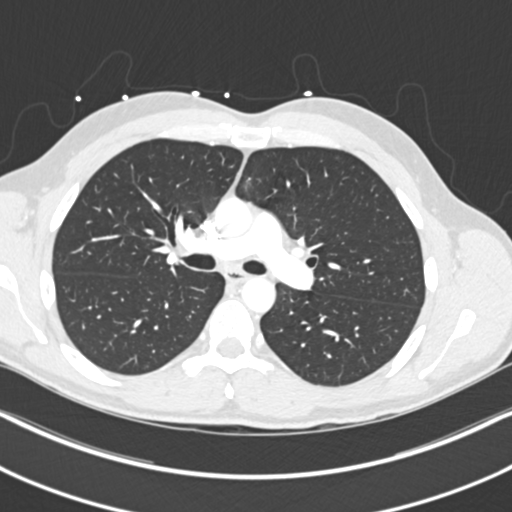
[im 189/291  mediastinal]
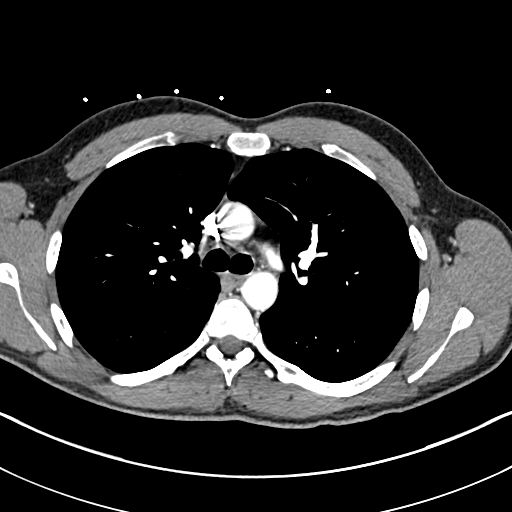
[im 204/291  lung]
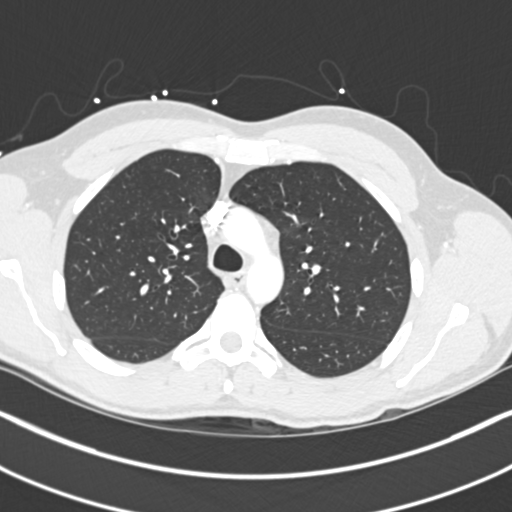
[im 218/291  mediastinal]
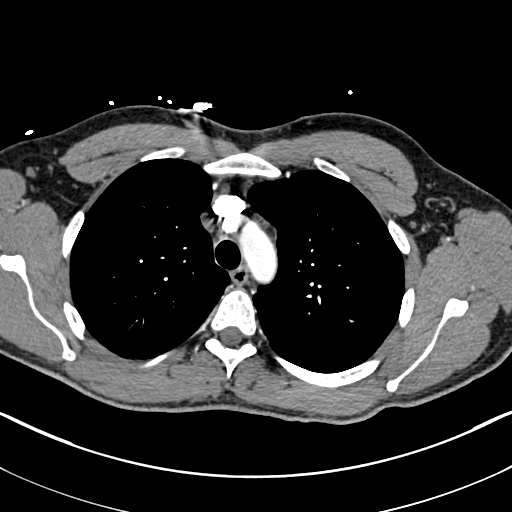
[im 233/291  lung]
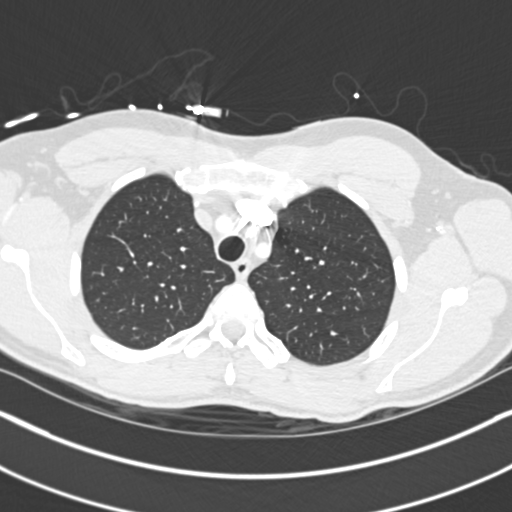
[im 247/291  mediastinal]
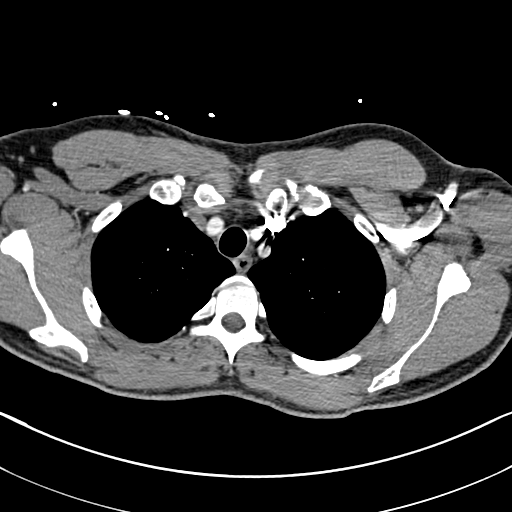
[im 262/291  lung]
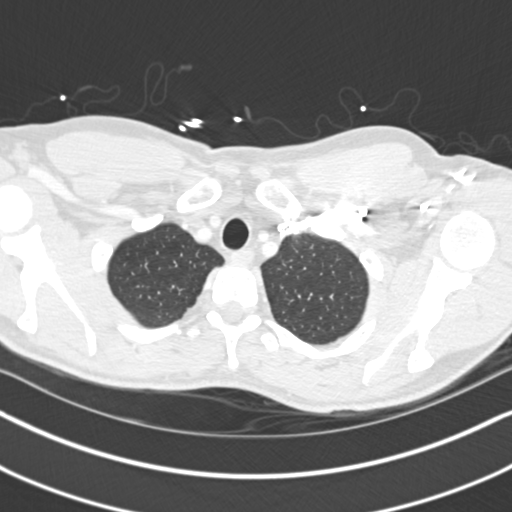
[im 276/291  mediastinal]
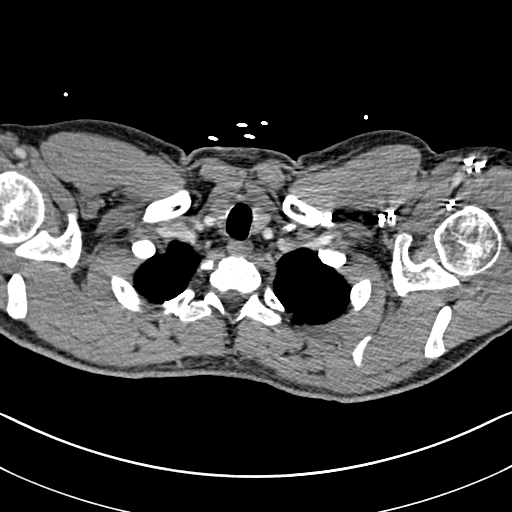

[Series 8: pe 2.0 coronal · coronal · 0.59mm/px · 1 of 118 slices shown]
[im 59/118  mediastinal]
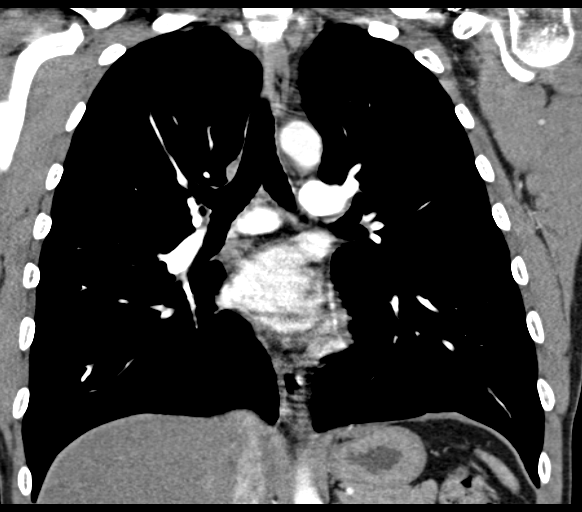

[19 of 36 positions shown; findings below may reference images not displayed]

FINDINGS: Technically adequate study for evaluation of pulmonary
embolism.  No pulmonary embolism is present.  The heart, aorta and
branch vessels are within normal limits.  Incidental imaging of the
upper abdomen is within normal limits.  Bones appear normal.
Persistent segmentation of the sternum.  Lungs appear within normal
limits.

Review of the MIP images confirms the above findings.
IMPRESSION: Negative CT pulmonary angiogram.  No cardiopulmonary disease.

## 2013-12-25 ENCOUNTER — Telehealth: Payer: Self-pay | Admitting: Internal Medicine

## 2013-12-25 DIAGNOSIS — G4710 Hypersomnia, unspecified: Secondary | ICD-10-CM

## 2013-12-25 MED ORDER — METHYLPHENIDATE HCL 10 MG PO TABS: ORAL_TABLET | ORAL | Status: DC

## 2013-12-25 NOTE — Telephone Encounter (Signed)
Called spoke with pt. Aware RX haws been printed and will have PW sign this. We then will mail RX to him. RX placed in PW look at for signature. Please advise once done thanks  Pt does not need a call back once done.

## 2013-12-25 NOTE — Telephone Encounter (Signed)
Pt last had refill on ritalin 10 mg 11/22/13 #90 x 0 refills Take 1 to 3 tabs by mouth daily as needed Last OV 03/28/13 Told to f/u in 1 year. CDY on vacation. Please advise Dr. Delford Field thanks

## 2013-12-25 NOTE — Telephone Encounter (Signed)
Methylphenidate 10 mg rx signed by Dr. Delford FieldWright and placed in mail to pt's home address.   Per below msg, pt does not need a call back once done.

## 2013-12-25 NOTE — Telephone Encounter (Signed)
i am ok with this refill 

## 2014-01-02 ENCOUNTER — Other Ambulatory Visit: Payer: Self-pay | Admitting: Internal Medicine

## 2014-01-02 NOTE — Telephone Encounter (Signed)
Please advise if okay to refill. Thanks.  

## 2014-01-02 NOTE — Telephone Encounter (Signed)
Ok to refill 

## 2014-01-24 ENCOUNTER — Telehealth: Payer: Self-pay | Admitting: Internal Medicine

## 2014-01-24 DIAGNOSIS — G4710 Hypersomnia, unspecified: Secondary | ICD-10-CM

## 2014-01-24 MED ORDER — METHYLPHENIDATE HCL 10 MG PO TABS: ORAL_TABLET | ORAL | Status: DC

## 2014-01-24 NOTE — Telephone Encounter (Signed)
rx has been printed out and placed on CY cart to be signed.  Will call pt once this is ready to be picked up.   

## 2014-01-25 NOTE — Telephone Encounter (Signed)
Done

## 2014-01-25 NOTE — Telephone Encounter (Signed)
rx has been signed by CY and placed up front for the pt to come by and pick up.  i have called pt and lmom to make the pt aware.

## 2014-02-21 ENCOUNTER — Telehealth: Payer: Self-pay | Admitting: Internal Medicine

## 2014-02-21 DIAGNOSIS — G4710 Hypersomnia, unspecified: Secondary | ICD-10-CM

## 2014-02-21 NOTE — Telephone Encounter (Signed)
Spoke with patient- aware that we can send Rx in mail to confirmed home address tomorrow after CY signs Rx as he is off this afternoon. Pt was told he need to make and keep OV for any further refills. Pt last seen 03-2013.

## 2014-02-22 MED ORDER — METHYLPHENIDATE HCL 10 MG PO TABS: ORAL_TABLET | ORAL | Status: DC

## 2014-02-22 NOTE — Telephone Encounter (Signed)
done

## 2014-02-28 ENCOUNTER — Telehealth: Payer: Self-pay | Admitting: Internal Medicine

## 2014-02-28 NOTE — Telephone Encounter (Signed)
Called and spoke with pt and he is aware that CY is not in the office this evening. Pt stated that he needs a letter from [facility] stating that he has been seen and treated by him for his condition.  Pt last seen 03/28/2013 and has a pending appt with CY on 12-11/2013.  Pt will need to be called to let him know if CY will do this letter so he can give [place] a fax number to fax this.   CY please advise.   No Known Allergies  Current Outpatient Prescriptions on File Prior to Visit  Medication Sig Dispense Refill  . amphetamine-dextroamphetamine (ADDERALL) 10 MG tablet Take 1 tablet (10 mg total) by mouth 3 (three) times daily.  90 tablet  0  . citalopram (CELEXA) 20 MG tablet TAKE 1 TABLET BY MOUTH EVERY DAY  90 tablet  0  . methylphenidate (RITALIN) 10 MG tablet Take 1 to 3 tabs by mouth daily as needed  90 tablet  0  . Multiple Vitamin (MULTIVITAMIN) tablet Take 1 tablet by mouth daily.      . protriptyline (VIVACTIL) 5 MG tablet TAKE 1 TABLET AT BEDTIME  90 tablet  2   No current facility-administered medications on file prior to visit.

## 2014-02-28 NOTE — Telephone Encounter (Signed)
We can send a letter stating that he is under our care. If needed we can state the date last seen, and the pending appointment date. Is that what he wants?

## 2014-03-01 NOTE — Telephone Encounter (Signed)
Pt states that he needs CY to create a letter and fax to [phone] stating that he is under CY's care for bronchitis, rhinitis, and idiopathic hypersomnia. Also, that patient has been treated with [place] int he past for his bronchitis and is stable. Then that the patient is being treated with Adderall and stable with is condition. We can also put in there that the patient has been compliant with his yearly follow ups and has a pending appt with CY on 04/30/2014.   Thanks.

## 2014-03-02 ENCOUNTER — Encounter: Payer: Self-pay | Admitting: Internal Medicine

## 2014-03-02 NOTE — Telephone Encounter (Signed)
Done

## 2014-03-02 NOTE — Telephone Encounter (Signed)
Called and spoke with pt and he is aware that letter has been completed by CY and this has been faxed to the number given below.  Nothing further is needed.

## 2014-03-20 ENCOUNTER — Telehealth: Payer: Self-pay | Admitting: Internal Medicine

## 2014-03-20 MED ORDER — AMPHETAMINE-DEXTROAMPHETAMINE 10 MG PO TABS: 10.0000 mg | ORAL_TABLET | Freq: Three times a day (TID) | ORAL | Status: DC

## 2014-03-20 NOTE — Telephone Encounter (Signed)
Pt aware that CY will be back in the morning to sign the Rx and will place in mail then. Pt was also reminded to keep his 04-30-14 appt with CY. Nothing more needed at this time.

## 2014-04-17 ENCOUNTER — Other Ambulatory Visit: Payer: Self-pay | Admitting: Internal Medicine

## 2014-04-18 ENCOUNTER — Other Ambulatory Visit: Payer: Self-pay | Admitting: Internal Medicine

## 2014-04-23 ENCOUNTER — Telehealth: Payer: Self-pay

## 2014-04-23 DIAGNOSIS — G4710 Hypersomnia, unspecified: Secondary | ICD-10-CM

## 2014-04-23 NOTE — Telephone Encounter (Signed)
CY please advise if ok to give refill of the ritalin for the pt.  Last seen 03/2013 and pt has a pending appt with CY next week. Thanks  No Known Allergies  Current Outpatient Prescriptions on File Prior to Visit  Medication Sig Dispense Refill  . amphetamine-dextroamphetamine (ADDERALL) 10 MG tablet Take 1 tablet (10 mg total) by mouth 3 (three) times daily. 90 tablet 0  . citalopram (CELEXA) 20 MG tablet TAKE 1 TABLET BY MOUTH EVERY DAY 90 tablet 0  . methylphenidate (RITALIN) 10 MG tablet Take 1 to 3 tabs by mouth daily as needed 90 tablet 0  . Multiple Vitamin (MULTIVITAMIN) tablet Take 1 tablet by mouth daily.    . protriptyline (VIVACTIL) 5 MG tablet TAKE 1 TABLET AT BEDTIME 90 tablet 2   No current facility-administered medications on file prior to visit.

## 2014-04-24 MED ORDER — METHYLPHENIDATE HCL 10 MG PO TABS: ORAL_TABLET | ORAL | Status: DC

## 2014-04-24 NOTE — Telephone Encounter (Signed)
Ok to refill 

## 2014-04-24 NOTE — Telephone Encounter (Signed)
Med refilled.  Pt aware that it up front.  Nothing further needed.

## 2014-04-30 ENCOUNTER — Ambulatory Visit: Payer: Self-pay | Admitting: Internal Medicine

## 2014-05-15 ENCOUNTER — Telehealth: Payer: Self-pay | Admitting: Internal Medicine

## 2014-05-15 DIAGNOSIS — G4710 Hypersomnia, unspecified: Secondary | ICD-10-CM

## 2014-05-15 MED ORDER — METHYLPHENIDATE HCL 10 MG PO TABS: ORAL_TABLET | ORAL | Status: DC

## 2014-05-15 NOTE — Telephone Encounter (Signed)
Pt returned call. Pt requested refill for Ritalin 10mg , 1-3 tabs qd prn. Last filled on 03/20/14 for # 90, 0 refills. Pt last seen in 03/2013. Appt with CY in 06/2014.  Dr. [name] please advise if ok for refill.  No Known Allergies   Current Outpatient Prescriptions on File Prior to Visit  Medication Sig Dispense Refill  . amphetamine-dextroamphetamine (ADDERALL) 10 MG tablet Take 1 tablet (10 mg total) by mouth 3 (three) times daily. 90 tablet 0  . citalopram (CELEXA) 20 MG tablet TAKE 1 TABLET BY MOUTH EVERY DAY 90 tablet 0  . methylphenidate (RITALIN) 10 MG tablet Take 1 to 3 tabs by mouth daily as needed 90 tablet 0  . Multiple Vitamin (MULTIVITAMIN) tablet Take 1 tablet by mouth daily.    . protriptyline (VIVACTIL) 5 MG tablet TAKE 1 TABLET AT BEDTIME 90 tablet 2   No current facility-administered medications on file prior to visit.

## 2014-05-15 NOTE — Telephone Encounter (Signed)
Pt last seen Nov 2014 and was a no show for ov with CDY 04/30/14  Needs to schedule appt and will have to ask CDY to okay refill  LMTCB for the pt

## 2014-05-15 NOTE — Telephone Encounter (Signed)
Ok to refill 

## 2014-05-15 NOTE — Telephone Encounter (Signed)
Rx printed and signed. Called and spoke to pt. Informed pt the rx has been placed in outgoing mail per his request. Pt verbalized understanding and denied any further questions or concerns at this time.

## 2014-06-19 ENCOUNTER — Telehealth: Payer: Self-pay | Admitting: Internal Medicine

## 2014-06-19 DIAGNOSIS — G4710 Hypersomnia, unspecified: Secondary | ICD-10-CM

## 2014-06-19 MED ORDER — METHYLPHENIDATE HCL 10 MG PO TABS: ORAL_TABLET | ORAL | Status: DC

## 2014-06-19 NOTE — Telephone Encounter (Signed)
Ok to refill as requested, but he hasn't been in in over a year and has appointment Feb 4. He must keep that appointment in order for [place] to continue refilling meds.

## 2014-06-19 NOTE — Telephone Encounter (Signed)
Rx returned to triage and mailed.  Tommy Ellison has left patient a message in earlier message. Will leave open for call back

## 2014-06-19 NOTE — Telephone Encounter (Signed)
Spoke with pt, states he needs a refill on ritalin.   Last refill 05/15/14 #90 0 refills. Pt wishes this to be mailed to him, verified home address.  Dr young are you ok with this refill?  Thanks!

## 2014-06-19 NOTE — Telephone Encounter (Signed)
lmomtcb x1 for pt RX printed and placed on CDY cart to be signed and then mailed

## 2014-06-20 NOTE — Telephone Encounter (Signed)
Called and spoke with pt and he is aware that the rx has been placed in the mail.  Nothing further is needed.

## 2014-06-20 NOTE — Telephone Encounter (Signed)
Pt returning call.Tommy Ellison ° °

## 2014-06-28 ENCOUNTER — Encounter: Payer: Self-pay | Admitting: Internal Medicine

## 2014-06-28 ENCOUNTER — Ambulatory Visit (INDEPENDENT_AMBULATORY_CARE_PROVIDER_SITE_OTHER): Payer: Self-pay | Admitting: Internal Medicine

## 2014-06-28 VITALS — BP 124/70 | HR 104 | Ht 69.0 in | Wt 178.2 lb

## 2014-06-28 DIAGNOSIS — G4710 Hypersomnia, unspecified: Secondary | ICD-10-CM

## 2014-06-28 NOTE — Patient Instructions (Signed)
We can continue current meds  Do what you can to sleep comfortably at night.

## 2014-06-28 NOTE — Progress Notes (Signed)
Patient ID: Tommy Ellison, male    DOB: 10/17/1975, 39 y.o.   MRN: [id number]  HPI 10/10/10- 35 yoM followed for idiopathic hypersomnia, complicated by rhinitis. Last here January 21, 2010. Here today with 7 month baby.  Has felt more tired for several months, easier fatigued. Meds don't seem to energize him as before. Denies fever, chest pain, palpitation. Saw rheumatologist several months ago for hand and knee pain. Told Xrays ok, but "something in blood is up". Admits drinking more alcohol. Beer- 2 to 6 after work most days. But in the past month he has only had 3 beers. . Wife concerned. He went once to a primary physician 3 months ago and had labs drawn. We discussed AA and Behavioral Health.  12/09/10- 35 yoM followed for idiopathic hypersomnia, complicated by rhinitis Celexa is working well and making a difference. There was only modest improvement with change from ritalin to adderall. Overall he is feeling better about things. He has cut back substantially on his alcohol. Tried Nuvigil- wasn't strong enough. He thinks vivactil helps reduce his snoring. He had difficulty finding it for awhile and had to shop pharmacies, but doing better.   06/23/11- 35 yoM followed for idiopathic hypersomnia, complicated by rhinitis And he is here with his 71-year-old daughter. He still has an occasional rough night when he doesn't sleep well. Otherwise he is stable in his use of alert in medications. I do not get a history of uncontrolled daytime sleepiness, overstimulation, cataplexy or sleep paralysis. Ativan helped occasional restless night in the past, and we talked about whether to use it now. He has had some lingering chest tightness and easier DOE, better but not quite well. Questions mild sinus infection with maxillary pressure. For years has tended to epistaxis if he blows his nose hard. ENT has tended to hinm in the past. No other bleed or bruising.   03/29/12- 35 yoM followed for idiopathic  hypersomnia, complicated by rhinitis Pt states that he has been waking up more during the night than normal. He resumed Vivactil last year. 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 He takes Naprosyn for sleep. 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 He is not clear that this was voluntary. Averages 8 hours of sleep and gets up feeling more rested in the morning. He still depends on Adderall and Celexa. Now here with 2 Tommy Ellison and active children. I'm again impressed with how patient he is with them.  03/28/13- 37 yoM followed for idiopathic hypersomnia, complicated by rhinitis FOLLOWS FOR: recent back issues-this is causing him to be awake more at night. Other than night it happens randomly. He declines flu vaccine-discussed.  He is doing Aeronautical engineer work. Has back trouble-disc-disturbing his sleep and he blames this for some daytime sleepiness. He switches between Adderall and Ritalin every few months to control tolerance. We reviewed sleep hygiene. He has continued protriptyline which has antidepressant and REM suppressing capacities. We have used it at night, recognizing that can be mildly stimulating, but it did seem to help him.  06/28/14- 38 yoM followed for idiopathic hypersomnia, complicated by rhinitis FOLLOWS FOR: meds continue to help. Doing great and no complaints He usually sleeps pretty well at night. Recently hurt shoulder and neck and a fall-affects sleep. Uses either Adderall or Ritalin, shifting around to avoid tolerance. No longer using Vivactil  ROS-see HPI Constitutional:   No-   weight  loss, night sweats, fevers, chills, +fatigue, lassitude. HEENT:   + headaches,  No-difficulty swallowing, tooth/dental problems, sore throat,       No-  sneezing, itching, ear ache,  +nasal congestion, post nasal drip,  CV:  No-   chest pain, orthopnea, PND, swelling in lower extremities, anasarca, dizziness,  palpitations Resp: Mild  shortness of breath with exertion or at rest.              No-   productive cough,  No non-productive cough,  No- coughing up of blood.              No-   change in color of mucus.  No- wheezing.   Skin: No-   rash or lesions. GI:  No-   heartburn, indigestion, abdominal pain, nausea, vomiting,  GU:  MS:  +shoulder joint pain or swelling.   Neuro-     nothing unusual Psych:  No- change in mood or affect. No depression or anxiety.  No memory loss.  Objective:   OBJ- Physical Exam General- Alert, Oriented, Affect-appropriate, Distress- none acute.    Skin- rash-none, lesions- none, excoriation- none Lymphadenopathy- none Head- atraumatic            Eyes- Gross vision intact, PERRLA, conjunctivae and secretions clear            Ears- Hearing, canals-normal            Nose- Clear, no-Septal dev, mucus, polyps, erosion, perforation             Throat- Mallampati II , mucosa clear , drainage- none, tonsils- atrophic Neck- flexible , trachea midline, no stridor , thyroid nl, carotid no bruit Chest - symmetrical excursion , unlabored           Heart/CV- RRR , no murmur , no gallop  , no rub, nl s1 s2                           - JVD- none , edema- none, stasis changes- none, varices- none           Lung- clear to P&A, wheeze- none, cough- none , dullness-none, rub- none           Chest wall-  Abd- Br/ Gen/ Rectal- Not done, not indicated Extrem- cyanosis- none, clubbing, none, atrophy- none, strength- nl Neuro- grossly intact to observation, no tremor

## 2014-07-08 NOTE — Assessment & Plan Note (Signed)
Adequate control. Recognize probable component of stress. When first evaluated he had a high pressure job with a lot of responsibility. Then went through a marital separation. Consider need for update objective testing.

## 2014-07-18 ENCOUNTER — Telehealth: Payer: Self-pay | Admitting: Internal Medicine

## 2014-07-18 DIAGNOSIS — G4710 Hypersomnia, unspecified: Secondary | ICD-10-CM

## 2014-07-18 NOTE — Telephone Encounter (Signed)
Spoke with pt, requesting ritalin refill sent to his home.  Verified address on file. Last refill was 06/19/14 #90.  CY are you ok with this refill?  Thanks!  No Known Allergies Current Outpatient Prescriptions on File Prior to Visit  Medication Sig Dispense Refill  . amphetamine-dextroamphetamine (ADDERALL) 10 MG tablet Take 1 tablet (10 mg total) by mouth 3 (three) times daily. 90 tablet 0  . citalopram (CELEXA) 20 MG tablet TAKE 1 TABLET BY MOUTH EVERY DAY 90 tablet 0  . methylphenidate (RITALIN) 10 MG tablet Take 1 to 3 tabs by mouth daily as needed 90 tablet 0  . Multiple Vitamin (MULTIVITAMIN) tablet Take 1 tablet by mouth daily.    . protriptyline (VIVACTIL) 5 MG tablet TAKE 1 TABLET AT BEDTIME 90 tablet 2   No current facility-administered medications on file prior to visit.

## 2014-07-19 MED ORDER — METHYLPHENIDATE HCL 10 MG PO TABS: ORAL_TABLET | ORAL | Status: DC

## 2014-07-19 NOTE — Telephone Encounter (Signed)
Pt is aware that rx will be mailed out to him. Nothing further was needed.

## 2014-07-19 NOTE — Telephone Encounter (Signed)
Ok to refill Ritalin- thanks

## 2014-08-15 ENCOUNTER — Telehealth: Payer: Self-pay | Admitting: Internal Medicine

## 2014-08-15 NOTE — Telephone Encounter (Signed)
Last OV 06/28/14 Last refill was on 03/20/14 #90  CY - please advise on refill. Thanks.

## 2014-08-16 MED ORDER — AMPHETAMINE-DEXTROAMPHETAMINE 10 MG PO TABS: 10.0000 mg | ORAL_TABLET | Freq: Three times a day (TID) | ORAL | Status: DC

## 2014-08-16 NOTE — Telephone Encounter (Signed)
Rx has been printed and placed on CY's cart to be signed. Will route message to Fleet Contras, since she is working with him today.

## 2014-08-16 NOTE — Telephone Encounter (Signed)
Ok to refill 

## 2014-08-20 NOTE — Telephone Encounter (Signed)
Rx was mailed out to pt on 08/16/14. Nothing further was needed.

## 2014-08-22 ENCOUNTER — Telehealth: Payer: Self-pay | Admitting: Internal Medicine

## 2014-08-22 ENCOUNTER — Other Ambulatory Visit: Payer: Self-pay | Admitting: *Deleted

## 2014-08-22 DIAGNOSIS — G4710 Hypersomnia, unspecified: Secondary | ICD-10-CM

## 2014-08-22 MED ORDER — METHYLPHENIDATE HCL 10 MG PO TABS: ORAL_TABLET | ORAL | Status: DC

## 2014-08-22 NOTE — Telephone Encounter (Signed)
Rx left at front desk for patient to pick up.  Patient notified and will come by the office before we close to get RX.  Nothing further needed.

## 2014-08-22 NOTE — Telephone Encounter (Signed)
Patient states that he has not received his prescription for Ritalin.  It was placed in the mail for him on 3/24.  Patient says he has to have the prescription today, his mother is having surgery tomorrow and he will have to stay home with her and will not be able to get out to get prescription filled.  He is completely out of his medication.    Dr. [name] is not currently in the office, sent to [facility], if I reprint the Rx for this patient, can you sign it?    Please advise.

## 2014-08-22 NOTE — Telephone Encounter (Signed)
Probably mail delayed due to holiday weekend  I will sign replacement if he can pick it up

## 2014-08-22 NOTE — Telephone Encounter (Signed)
Dr. [name] is still here in office. Please advise thanks

## 2014-08-22 NOTE — Telephone Encounter (Signed)
Dr. [name], patient did not receive his Ritalin rx.  Wants to pick up today.  See message below.  Current Outpatient Prescriptions on File Prior to Visit  Medication Sig Dispense Refill  . amphetamine-dextroamphetamine (ADDERALL) 10 MG tablet Take 1 tablet (10 mg total) by mouth 3 (three) times daily. 90 tablet 0  . citalopram (CELEXA) 20 MG tablet TAKE 1 TABLET BY MOUTH EVERY DAY 90 tablet 0  . methylphenidate (RITALIN) 10 MG tablet Take 1 to 3 tabs by mouth daily as needed 90 tablet 0  . Multiple Vitamin (MULTIVITAMIN) tablet Take 1 tablet by mouth daily.    . protriptyline (VIVACTIL) 5 MG tablet TAKE 1 TABLET AT BEDTIME 90 tablet 2   No current facility-administered medications on file prior to visit.   No Known Allergies

## 2014-10-09 ENCOUNTER — Telehealth: Payer: Self-pay | Admitting: Internal Medicine

## 2014-10-09 DIAGNOSIS — G4710 Hypersomnia, unspecified: Secondary | ICD-10-CM

## 2014-10-09 NOTE — Telephone Encounter (Signed)
Spoke with pt, is requesting generic ritalin refill. Last refill 08/22/14 #90 Last ov: 06/28/14 Next ov: none  Dr. [name] please advise if you're ok with this refill.  Thanks!

## 2014-10-10 MED ORDER — METHYLPHENIDATE HCL 10 MG PO TABS
ORAL_TABLET | ORAL | Status: DC
Start: 2014-10-10 — End: 2014-11-08

## 2014-10-10 NOTE — Telephone Encounter (Signed)
Ok to refill as requested. Please remind him that we need to see him once a year for controlled drug.

## 2014-10-10 NOTE — Telephone Encounter (Signed)
RX printed and placed on CY's cart for signature.   Pt aware.  Nothing further needed.

## 2014-11-08 ENCOUNTER — Telehealth: Payer: Self-pay | Admitting: Internal Medicine

## 2014-11-08 DIAGNOSIS — G4710 Hypersomnia, unspecified: Secondary | ICD-10-CM

## 2014-11-08 MED ORDER — METHYLPHENIDATE HCL 10 MG PO TABS
ORAL_TABLET | ORAL | Status: DC
Start: 2014-11-08 — End: 2014-12-11

## 2014-11-08 NOTE — Telephone Encounter (Signed)
Spoke with pt, requesting Ritalin refill.  Last refilled 10/10/14 #90 1-3 daily with 0 refills. Pt wants this sent to his home, verified address on file.  Dr. [name] are you ok with this refill?  Thanks!

## 2014-11-08 NOTE — Telephone Encounter (Signed)
Pt aware that Rx will be printed signed and mailed to his home address.  Will not be mailed out until tomorrow.  Addressed envelope attached to Rx.  Will send to CY to ensure signed.

## 2014-11-08 NOTE — Telephone Encounter (Signed)
Ok to refill as requested 

## 2014-11-08 NOTE — Telephone Encounter (Signed)
Rx signed by CY and placed in outgoing mail by Morrie Sheldon CMA VM left informing pt Will sign off

## 2014-12-11 ENCOUNTER — Telehealth: Payer: Self-pay | Admitting: Internal Medicine

## 2014-12-11 DIAGNOSIS — G4710 Hypersomnia, unspecified: Secondary | ICD-10-CM

## 2014-12-11 MED ORDER — METHYLPHENIDATE HCL 10 MG PO TABS: ORAL_TABLET | ORAL | Status: DC

## 2014-12-11 NOTE — Telephone Encounter (Signed)
Called spoke with pt. Aware RX is ready for pick up. Nothing further needed

## 2014-12-11 NOTE — Telephone Encounter (Signed)
Patient called back with new cell #.  He may be reached at [phone].

## 2014-12-11 NOTE — Telephone Encounter (Signed)
Last OV 06/28/14 No pending OV Last refill was on 11/08/14 1-3 tablets daily as needed #90  CY - please advise on refill. Thanks.

## 2014-12-11 NOTE — Telephone Encounter (Signed)
Ok to refill 

## 2015-01-04 ENCOUNTER — Telehealth: Payer: Self-pay | Admitting: Internal Medicine

## 2015-01-04 DIAGNOSIS — G4710 Hypersomnia, unspecified: Secondary | ICD-10-CM

## 2015-01-04 NOTE — Telephone Encounter (Signed)
Voicemail has not been set up yet wcb

## 2015-01-08 MED ORDER — METHYLPHENIDATE HCL 10 MG PO TABS: ORAL_TABLET | ORAL | Status: DC

## 2015-01-08 NOTE — Telephone Encounter (Signed)
ATC, no voicemail set up.  Rx printed and given to Dr [name] to sign and will be placed up front for pick up today.

## 2015-01-08 NOTE — Telephone Encounter (Signed)
Ok to refill 

## 2015-01-08 NOTE — Telephone Encounter (Signed)
Called spoke with pt. He is requesting to pick up RX for methylphenidate (RITALIN) 10 MG tablet Take 1 to 3 tabs by mouth daily as needed Last refilled 12/08/14 #90  x0 refills  Please advise Dr. [name] thanks

## 2015-01-09 NOTE — Telephone Encounter (Signed)
Called and spoke to pt. Pt has already picked up rx. Nothing further needed at this time.

## 2015-02-11 ENCOUNTER — Telehealth: Payer: Self-pay | Admitting: Internal Medicine

## 2015-02-11 DIAGNOSIS — G4710 Hypersomnia, unspecified: Secondary | ICD-10-CM

## 2015-02-11 MED ORDER — METHYLPHENIDATE HCL 10 MG PO TABS: ORAL_TABLET | ORAL | Status: DC

## 2015-02-11 NOTE — Telephone Encounter (Signed)
Last OV 06/28/14 No pending OV Last refill 01/08/15 >> Methylphenidate 10mg  1-3 daily prn #90

## 2015-02-11 NOTE — Telephone Encounter (Signed)
Ok to refill 

## 2015-02-11 NOTE — Telephone Encounter (Signed)
Rx printed, signed and left at front for patient to pick up. Patient notified. Nothing further needed.  

## 2015-03-13 ENCOUNTER — Telehealth: Payer: Self-pay | Admitting: Internal Medicine

## 2015-03-13 MED ORDER — AMPHETAMINE-DEXTROAMPHETAMINE 10 MG PO TABS: 10.0000 mg | ORAL_TABLET | Freq: Three times a day (TID) | ORAL | Status: DC

## 2015-03-13 NOTE — Telephone Encounter (Signed)
Last ov on 06/28/14 with CY No pending OV Last refill on 02/11/15 for Methylphenidate 10mg  1-3 tabs daily prn #90 x0 refills Called and spoke with pt. He states he will come to office to pick up rx if approved.  CY please advise if okay to refill  No Known Allergies  Current Outpatient Prescriptions on File Prior to Visit  Medication Sig Dispense Refill  . amphetamine-dextroamphetamine (ADDERALL) 10 MG tablet Take 1 tablet (10 mg total) by mouth 3 (three) times daily. 90 tablet 0  . citalopram (CELEXA) 20 MG tablet TAKE 1 TABLET BY MOUTH EVERY DAY 90 tablet 0  . methylphenidate (RITALIN) 10 MG tablet Take 1 to 3 tabs by mouth daily as needed 90 tablet 0  . Multiple Vitamin (MULTIVITAMIN) tablet Take 1 tablet by mouth daily.    . protriptyline (VIVACTIL) 5 MG tablet TAKE 1 TABLET AT BEDTIME 90 tablet 2   No current facility-administered medications on file prior to visit.

## 2015-03-13 NOTE — Telephone Encounter (Signed)
atc pt, no answer, no vm.    rx printed and left up front for pickup.

## 2015-03-13 NOTE — Telephone Encounter (Signed)
Pt aware.

## 2015-03-13 NOTE — Telephone Encounter (Signed)
Ok to refill 

## 2015-03-15 ENCOUNTER — Other Ambulatory Visit: Payer: Self-pay | Admitting: Emergency Medicine

## 2015-03-15 DIAGNOSIS — G4710 Hypersomnia, unspecified: Secondary | ICD-10-CM

## 2015-03-15 MED ORDER — METHYLPHENIDATE HCL 10 MG PO TABS: ORAL_TABLET | ORAL | Status: DC

## 2015-03-15 NOTE — Telephone Encounter (Signed)
Pt came to pick up rx and noticed the rx was for Adderall and not Ritalin like requested. Informed CY of mix up and new Ritalin rx was printed, signed and given to pt. The Adderall rx was marked "void" and ripped up and placed in shred bin, with Sherrin Daisy and York Ram as a witness. Pt received correct Ritalin rx and did not receive Adderall rx. Nothing further needed at this time.

## 2015-04-09 ENCOUNTER — Telehealth: Payer: Self-pay | Admitting: Internal Medicine

## 2015-04-09 DIAGNOSIS — G4710 Hypersomnia, unspecified: Secondary | ICD-10-CM

## 2015-04-09 NOTE — Telephone Encounter (Signed)
Ok to refill. Need to make a firm ROV appointment in February please. This is a controlled drug and we muxt see him once a year.

## 2015-04-09 NOTE — Telephone Encounter (Signed)
Will forward to [facility] to see where pt can be placed on Feb 2017 schedule  Katie, please advise. Thanks

## 2015-04-09 NOTE — Telephone Encounter (Signed)
Pt can be scheduled on 06/26/15 at 11:15am slot. Thanks.

## 2015-04-09 NOTE — Telephone Encounter (Signed)
Pt is requesting a refill on Ritalin. Last OV was on 06/28/14. He does not have a pending OV. Last refill on Ritalin was on 03/15/15 #90.  CY - please advise on refill. Thanks.

## 2015-04-09 NOTE — Telephone Encounter (Signed)
Will hold message in triage until 04/10/15 for CY to sign rx

## 2015-04-10 MED ORDER — METHYLPHENIDATE HCL 10 MG PO TABS: ORAL_TABLET | ORAL | Status: DC

## 2015-04-10 NOTE — Telephone Encounter (Signed)
Patient scheduled for follow up 06/26/15.  Patient notified of appointment, patient awaiting call back to pick up rx. Florentina Addison, has Dr. [name] signed the Rx for this patient yet, he is waiting for a call to pick up Rx.

## 2015-04-10 NOTE — Telephone Encounter (Signed)
Tommy Ellison has not had Rx come across his desk to sign for patient. Rx has been printed,signed, and placed at front for patient to pick up. Pt is aware that Rx is at front and aware to keep 06-2015 appt. Nothing more needed at this time.

## 2015-05-08 ENCOUNTER — Telehealth: Payer: Self-pay | Admitting: Internal Medicine

## 2015-05-08 DIAGNOSIS — G4710 Hypersomnia, unspecified: Secondary | ICD-10-CM

## 2015-05-08 NOTE — Telephone Encounter (Signed)
Pt is requesting refill on Methyphenidate 10mg  tablets Last filled 04/10/15 #90 with no refills Last OV 06/28/2014 Next appt in office 06/26/2015  Dr [name], please advise if you are ok with refilling medication. Thanks   Pt is wanting to pick up rx when ready

## 2015-05-09 MED ORDER — METHYLPHENIDATE HCL 10 MG PO TABS: ORAL_TABLET | ORAL | Status: DC

## 2015-05-09 NOTE — Telephone Encounter (Signed)
Spoke with pt. Aware RX placed for pick up. Nothing further needed 

## 2015-05-09 NOTE — Telephone Encounter (Signed)
Ok to refill 

## 2015-06-10 ENCOUNTER — Telehealth: Payer: Self-pay | Admitting: Internal Medicine

## 2015-06-10 DIAGNOSIS — G4710 Hypersomnia, unspecified: Secondary | ICD-10-CM

## 2015-06-10 MED ORDER — METHYLPHENIDATE HCL 10 MG PO TABS: ORAL_TABLET | ORAL | Status: DC

## 2015-06-10 NOTE — Telephone Encounter (Signed)
Rx has been signed by CY and pt is aware that it is ready for pick up. Rx at front in pick up file. Nothing more needed at this time.

## 2015-06-10 NOTE — Telephone Encounter (Signed)
Per CY: ok to refill.  Rx printed and placed on CY's cart for signature.

## 2015-06-10 NOTE — Telephone Encounter (Signed)
Pt is requesting a refill on Ritalin 10mg . Last OV was on 06/28/14. Has pending OV on 06/26/15. Last refill was on 05/09/15 #90 1-3 tablets daily prn.  CY - please advise on refill. Thanks.

## 2015-06-26 ENCOUNTER — Ambulatory Visit (INDEPENDENT_AMBULATORY_CARE_PROVIDER_SITE_OTHER): Payer: Self-pay | Admitting: Internal Medicine

## 2015-06-26 ENCOUNTER — Encounter: Payer: Self-pay | Admitting: Internal Medicine

## 2015-06-26 VITALS — BP 124/84 | HR 77 | Ht 69.0 in | Wt 175.2 lb

## 2015-06-26 DIAGNOSIS — G4710 Hypersomnia, unspecified: Secondary | ICD-10-CM

## 2015-06-26 MED ORDER — AMPHETAMINE-DEXTROAMPHETAMINE 10 MG PO TABS: 10.0000 mg | ORAL_TABLET | Freq: Three times a day (TID) | ORAL | Status: DC

## 2015-06-26 MED ORDER — METHYLPHENIDATE HCL 10 MG PO TABS: ORAL_TABLET | ORAL | Status: DC

## 2015-06-26 NOTE — Assessment & Plan Note (Signed)
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 This has been stable over time. Sleep habits reported appropriate. Medications well tolerated. Plan-refill Adderall and methylphenidate to alternate between the 2 as before.

## 2015-06-26 NOTE — Patient Instructions (Signed)
Meds refilled  Sleep is always better than a pill if you have the option, so nap if you can, and always be sure you are getting enough night time sleep on a regular schedule.  Please call if we can help

## 2015-06-26 NOTE — Progress Notes (Signed)
Patient ID: Tommy Ellison, male    DOB: [birth year], 40 y.o.   MRN: [id number]  HPI 10/10/10- 35 yoM followed for idiopathic hypersomnia, complicated by rhinitis. Last here January 21, 2010. Here today with 7 month baby.  Has felt more tired for several months, easier fatigued. Meds don't seem to energize him as before. Denies fever, chest pain, palpitation. Saw rheumatologist several months ago for hand and knee pain. Told Xrays ok, but "something in blood is up". Admits drinking more alcohol. Beer- 2 to 6 after work most days. But in the past month he has only had 3 beers. . Wife concerned. He went once to a primary physician 3 months ago and had labs drawn. We discussed AA and Behavioral Health.  12/09/10- 35 yoM followed for idiopathic hypersomnia, complicated by rhinitis Celexa is working well and making a difference. There was only modest improvement with change from ritalin to adderall. Overall he is feeling better about things. He has cut back substantially on his alcohol. Tried Nuvigil- wasn't strong enough. He thinks vivactil helps reduce his snoring. He had difficulty finding it for awhile and had to shop pharmacies, but doing better.   06/23/11- 35 yoM followed for idiopathic hypersomnia, complicated by rhinitis And he is here with his 83-year-old daughter. He still has an occasional rough night when he doesn't sleep well. Otherwise he is stable in his use of alert in medications. I do not get a history of uncontrolled daytime sleepiness, overstimulation, cataplexy or sleep paralysis. Ativan helped occasional restless night in the past, and we talked about whether to use it now. He has had some lingering chest tightness and easier DOE, better but not quite well. Questions mild sinus infection with maxillary pressure. For years has tended to epistaxis if he blows his nose hard. ENT has tended to hinm in the past. No other bleed or bruising.   03/29/12- 35 yoM followed for idiopathic  hypersomnia, complicated by rhinitis Pt states that he has been waking up more during the night than normal. He resumed Vivactil last year. 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 He takes Naprosyn for sleep. 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 He is not clear that this was voluntary. Averages 8 hours of sleep and gets up feeling more rested in the morning. He still depends on Adderall and Celexa. Now here with 2 Tommy Ellison and active children. I'm again impressed with how patient he is with them.  03/28/13- 37 yoM followed for idiopathic hypersomnia, complicated by rhinitis FOLLOWS FOR: recent back issues-this is causing him to be awake more at night. Other than night it happens randomly. He declines flu vaccine-discussed.  He is doing Aeronautical engineer work. Has back trouble-disc-disturbing his sleep and he blames this for some daytime sleepiness. He switches between Adderall and Ritalin every few months to control tolerance. We reviewed sleep hygiene. He has continued protriptyline which has antidepressant and REM suppressing capacities. We have used it at night, recognizing that can be mildly stimulating, but it did seem to help him.  06/28/14- 38 yoM followed for idiopathic hypersomnia, complicated by rhinitis FOLLOWS FOR: meds continue to help. Doing great and no complaints He usually sleeps pretty well at night. Recently hurt shoulder and neck and a fall-affects sleep. Uses either Adderall or Ritalin, shifting around to avoid tolerance. No longer using Vivactil  06/26/2015-40 year old male never smoker followed for idiopathic hypersomnia, complicated  by rhinitis Meds continue to help.  No new complaints. A shoulder injury last year still hurts some but affects his sleep last now. Nighttime sleep seems appropriate. He does nap occasionally. Without medication oppressive daytime sleepiness remains an issue, still  without cataplexy. He has tolerated Adderall and methylphenidate very well, shifting back and forth between them to avoid tolerance. No misuse, no overstimulation.  ROS-see HPI Constitutional:   No-   weight loss, night sweats, fevers, chills, +fatigue, lassitude. HEENT:   + headaches,  No-difficulty swallowing, tooth/dental problems, sore throat,       No-  sneezing, itching, ear ache,  +nasal congestion, post nasal drip,  CV:  No-   chest pain, orthopnea, PND, swelling in lower extremities, anasarca, dizziness, palpitations Resp: Mild  shortness of breath with exertion or at rest.              No-   productive cough,  No non-productive cough,  No- coughing up of blood.              No-   change in color of mucus.  No- wheezing.   Skin: No-   rash or lesions. GI:  No-   heartburn, indigestion, abdominal pain, nausea, vomiting,  GU:  MS:  +shoulder joint pain or swelling.   Neuro-     nothing unusual Psych:  No- change in mood or affect. No depression or anxiety.  No memory loss.  Objective:   OBJ- Physical Exam General- Alert, Oriented, Affect-appropriate, Distress- none acute.    Skin- rash-none, lesions- none, excoriation- none Lymphadenopathy- none Head- atraumatic            Eyes- Gross vision intact, PERRLA, conjunctivae and secretions clear            Ears- Hearing, canals-normal            Nose- Clear, no-Septal dev, mucus, polyps, erosion, perforation             Throat- Mallampati II , mucosa clear , drainage- none, tonsils- atrophic Neck- flexible , trachea midline, no stridor , thyroid nl, carotid no bruit Chest - symmetrical excursion , unlabored           Heart/CV- RRR , no murmur , no gallop  , no rub, nl s1 s2                           - JVD- none , edema- none, stasis changes- none, varices- none           Lung- clear to P&A, wheeze- none, cough- none , dullness-none, rub- none           Chest wall-  Abd- Br/ Gen/ Rectal- Not done, not indicated Extrem- cyanosis-  none, clubbing, none, atrophy- none, strength- nl Neuro- grossly intact to observation, no tremor

## 2015-08-05 ENCOUNTER — Telehealth: Payer: Self-pay | Admitting: Internal Medicine

## 2015-08-05 DIAGNOSIS — G4710 Hypersomnia, unspecified: Secondary | ICD-10-CM

## 2015-08-05 NOTE — Telephone Encounter (Signed)
Pt calling for refill of Ritalin  Last filled 2/1/017 #90 Upcoming OV 06/25/2016 Requests this be mailed to his home address (verified) Please advise Dr [name] if okay to fill. Thanks.

## 2015-08-05 NOTE — Telephone Encounter (Signed)
Ok to refill 

## 2015-08-06 MED ORDER — METHYLPHENIDATE HCL 10 MG PO TABS: ORAL_TABLET | ORAL | Status: DC

## 2015-08-06 NOTE — Telephone Encounter (Signed)
LMOM for pt that rx for Ritalin will be mailed to his verified address.

## 2015-09-05 ENCOUNTER — Telehealth: Payer: Self-pay | Admitting: Internal Medicine

## 2015-09-05 DIAGNOSIS — G4710 Hypersomnia, unspecified: Secondary | ICD-10-CM

## 2015-09-05 MED ORDER — METHYLPHENIDATE HCL 10 MG PO TABS: ORAL_TABLET | ORAL | Status: DC

## 2015-09-05 NOTE — Telephone Encounter (Signed)
Left message that Rx has been placed in our out going mail.

## 2015-09-26 ENCOUNTER — Telehealth: Payer: Self-pay | Admitting: Internal Medicine

## 2015-09-26 DIAGNOSIS — G4710 Hypersomnia, unspecified: Secondary | ICD-10-CM

## 2015-09-26 MED ORDER — METHYLPHENIDATE HCL 10 MG PO TABS: ORAL_TABLET | ORAL | Status: DC

## 2015-09-26 NOTE — Telephone Encounter (Signed)
LVM for pt to return call.   Last refilled 09/05/15 Ritalin Take 1 to 3 tabs by mouth daily as needed # 90 with 0 refills  Rx has been signed and placed at the front of the office for pick up.

## 2015-09-27 NOTE — Telephone Encounter (Signed)
Pt advised. Nothing further needed.   

## 2015-09-27 NOTE — Telephone Encounter (Signed)
Patient returned our call on med refill. He can be reached at [phone]

## 2015-11-04 ENCOUNTER — Telehealth: Payer: Self-pay | Admitting: Internal Medicine

## 2015-11-04 DIAGNOSIS — G4710 Hypersomnia, unspecified: Secondary | ICD-10-CM

## 2015-11-04 NOTE — Telephone Encounter (Signed)
Please advise Dr [name] if able to refill Ritalin. Thanks.  Last filled 09/26/15 for Generic #90 (take 1-3 tabs qd) Upcoming OV 06/25/2016 CY- please advise. Thanks.

## 2015-11-05 MED ORDER — METHYLPHENIDATE HCL 10 MG PO TABS: ORAL_TABLET | ORAL | Status: DC

## 2015-11-05 NOTE — Telephone Encounter (Signed)
Pt aware of rx approval and will come by this afternoon to pick up. Rx given to CY to sign and taken to front desk. Nothing further needed.

## 2015-11-05 NOTE — Telephone Encounter (Signed)
Ok to refill 

## 2015-12-05 ENCOUNTER — Telehealth: Payer: Self-pay | Admitting: Internal Medicine

## 2015-12-05 DIAGNOSIS — G4710 Hypersomnia, unspecified: Secondary | ICD-10-CM

## 2015-12-05 MED ORDER — METHYLPHENIDATE HCL 10 MG PO TABS: ORAL_TABLET | ORAL | Status: DC

## 2015-12-05 NOTE — Telephone Encounter (Signed)
Last ov 2.1.17 w/ CY, follow up in 1 year Ritalin last refilled 6.12.17 Called spoke with patient and verified that he is requesting a refill on his generic Ritalin 10mg  1-3tabs by mouth daily as needed Pt would like to pick this up - he is aware it will be ready for him tomorrow  Dr. [name] please advise, thank you.

## 2015-12-06 MED ORDER — METHYLPHENIDATE HCL 10 MG PO TABS: ORAL_TABLET | ORAL | Status: DC

## 2015-12-06 NOTE — Telephone Encounter (Signed)
Ok to refill 

## 2015-12-06 NOTE — Telephone Encounter (Signed)
rx has been printed out and signed by CY.  rx placed up front and the pt is aware that this is ready to be picked up.

## 2016-01-02 ENCOUNTER — Telehealth: Payer: Self-pay | Admitting: Internal Medicine

## 2016-01-02 NOTE — Telephone Encounter (Signed)
lmtcb for pt.  

## 2016-01-03 MED ORDER — AMPHETAMINE-DEXTROAMPHETAMINE 10 MG PO TABS: 10.0000 mg | ORAL_TABLET | Freq: Three times a day (TID) | ORAL | 0 refills | Status: DC

## 2016-01-03 NOTE — Telephone Encounter (Signed)
Pt is requesting refill on Ritalin. Last OV was on 06/26/15. Has pending OV on 06/25/16. Last refill was on 12/06/15 #90.  CY - please advise on refill. Thanks.

## 2016-01-03 NOTE — Telephone Encounter (Signed)
Ok to refill 

## 2016-01-03 NOTE — Telephone Encounter (Signed)
Called and spoke with pt and he is aware of rx that will be signed by CY and placed up front.  He will come by later today to pick up rx.

## 2016-02-03 ENCOUNTER — Telehealth: Payer: Self-pay | Admitting: Internal Medicine

## 2016-02-03 DIAGNOSIS — G4710 Hypersomnia, unspecified: Secondary | ICD-10-CM

## 2016-02-03 NOTE — Telephone Encounter (Signed)
LVM for pt to return call

## 2016-02-04 MED ORDER — METHYLPHENIDATE HCL 10 MG PO TABS
ORAL_TABLET | ORAL | 0 refills | Status: DC
Start: 2016-02-04 — End: 2016-03-05

## 2016-02-04 NOTE — Telephone Encounter (Signed)
Spoke with pt. He would like a refill on Ritalin 10mg . This has last refilled by CY on 12/06/15. Pt's last OV with CY was on 06/26/15. Has pending OV on 06/25/16. Pt would like to pick this prescription up.  CY - please advise on refill.

## 2016-02-04 NOTE — Telephone Encounter (Signed)
Patient returning call - He can be reached at [phone]

## 2016-02-04 NOTE — Telephone Encounter (Signed)
Rx printed, signed and ready for pick up at front desk. Pt aware. Nothing further needed.

## 2016-02-04 NOTE — Telephone Encounter (Signed)
Ok to refill 

## 2016-03-02 ENCOUNTER — Telehealth: Payer: Self-pay | Admitting: Internal Medicine

## 2016-03-02 DIAGNOSIS — G4710 Hypersomnia, unspecified: Secondary | ICD-10-CM

## 2016-03-02 NOTE — Telephone Encounter (Signed)
LMTCB-will need to know if patient wants to pick up or have mailed to home address(pt usually picks up).

## 2016-03-03 NOTE — Telephone Encounter (Signed)
Spoke with pt. He needs a refill on Ritalin. Last OV was on 06/26/2015. Has pending OV on 06/25/2016. Last refill was on 02/04/16 #90. Pt would like to pick this prescription up once it's ready.  CY - please advise on refill. Thanks.

## 2016-03-05 MED ORDER — METHYLPHENIDATE HCL 10 MG PO TABS
ORAL_TABLET | ORAL | 0 refills | Status: DC
Start: 2016-03-05 — End: 2016-03-31

## 2016-03-05 NOTE — Telephone Encounter (Signed)
Patient waiting in the lobby to pick up rx - pr

## 2016-03-05 NOTE — Telephone Encounter (Signed)
Spoke with pt, advised that CY is in a room with a patient currently, but that we would be providing him with the signed rx once CY signs it.  Pt expressed understanding.    rx printed and left on CY's cart for signature, and KW is taking this to the pt once it is signed.  Nothing further needed at this time.

## 2016-03-31 ENCOUNTER — Telehealth: Payer: Self-pay | Admitting: Internal Medicine

## 2016-03-31 DIAGNOSIS — G4710 Hypersomnia, unspecified: Secondary | ICD-10-CM

## 2016-03-31 MED ORDER — METHYLPHENIDATE HCL 10 MG PO TABS: ORAL_TABLET | ORAL | 0 refills | Status: DC

## 2016-03-31 NOTE — Telephone Encounter (Signed)
RX left up front. Pt aware & voiced understanding. Nothing further needed.

## 2016-03-31 NOTE — Telephone Encounter (Signed)
Pt requesting generic ritalin.  CY please advise if you are okay with refilling this. Thanks.

## 2016-03-31 NOTE — Telephone Encounter (Signed)
Ok to refill ritalin 

## 2016-04-30 ENCOUNTER — Telehealth: Payer: Self-pay | Admitting: Internal Medicine

## 2016-04-30 DIAGNOSIS — G4710 Hypersomnia, unspecified: Secondary | ICD-10-CM

## 2016-04-30 MED ORDER — METHYLPHENIDATE HCL 10 MG PO TABS: ORAL_TABLET | ORAL | 0 refills | Status: DC

## 2016-04-30 NOTE — Telephone Encounter (Signed)
rx has been printed out and placed on CY cart for rx to be signed.  Will call the pt once this is completed and ready to be picked up.

## 2016-04-30 NOTE — Telephone Encounter (Signed)
Pt is requesting Rtialin refill - 10mg , 1-3 tabs by mouth daily as needed.  Last filled on 11.7.17 with #90 with 0 refills.   Dr. [name] please advise on refill. Thanks.   No Known Allergies  Current Outpatient Prescriptions on File Prior to Visit  Medication Sig Dispense Refill  . amphetamine-dextroamphetamine (ADDERALL) 10 MG tablet Take 1 tablet (10 mg total) by mouth 3 (three) times daily. 90 tablet 0  . citalopram (CELEXA) 20 MG tablet TAKE 1 TABLET BY MOUTH EVERY DAY 90 tablet 0  . methylphenidate (RITALIN) 10 MG tablet Take 1 to 3 tabs by mouth daily as needed 90 tablet 0  . Multiple Vitamin (MULTIVITAMIN) tablet Take 1 tablet by mouth daily.     No current facility-administered medications on file prior to visit.

## 2016-04-30 NOTE — Telephone Encounter (Signed)
Ok to refill 

## 2016-05-01 NOTE — Telephone Encounter (Signed)
Pt is aware that Rx has been signed and ready for pick up. Placed in brown file folder at front for pick up. Nothing more needed at this time.

## 2016-05-26 ENCOUNTER — Telehealth: Payer: Self-pay | Admitting: Internal Medicine

## 2016-05-26 DIAGNOSIS — G4710 Hypersomnia, unspecified: Secondary | ICD-10-CM

## 2016-05-26 MED ORDER — METHYLPHENIDATE HCL 10 MG PO TABS: ORAL_TABLET | ORAL | 0 refills | Status: DC

## 2016-05-26 NOTE — Telephone Encounter (Signed)
Rx signed and placed up front. Lm to make pt aware. Nothing further needed.

## 2016-05-26 NOTE — Telephone Encounter (Signed)
Ok to refill Ritalin

## 2016-05-26 NOTE — Telephone Encounter (Signed)
Pt is requesting Ritalin 10mg  refill. RX last refilled on 04-30-16 with 90 tablets,take 1 to 3 tabs by mouth daily prn. Pt's last OV was 06-26-15 with an pending apt for 06-25-16. CY please advise on refill. Thanks.  Current Outpatient Prescriptions on File Prior to Visit  Medication Sig Dispense Refill  . amphetamine-dextroamphetamine (ADDERALL) 10 MG tablet Take 1 tablet (10 mg total) by mouth 3 (three) times daily. 90 tablet 0  . citalopram (CELEXA) 20 MG tablet TAKE 1 TABLET BY MOUTH EVERY DAY 90 tablet 0  . methylphenidate (RITALIN) 10 MG tablet Take 1 to 3 tabs by mouth daily as needed 90 tablet 0  . Multiple Vitamin (MULTIVITAMIN) tablet Take 1 tablet by mouth daily.     No current facility-administered medications on file prior to visit.     No Known Allergies

## 2016-06-25 ENCOUNTER — Ambulatory Visit: Payer: Self-pay | Admitting: Internal Medicine

## 2016-06-30 ENCOUNTER — Telehealth: Payer: Self-pay | Admitting: Internal Medicine

## 2016-06-30 DIAGNOSIS — G4710 Hypersomnia, unspecified: Secondary | ICD-10-CM

## 2016-06-30 MED ORDER — METHYLPHENIDATE HCL 10 MG PO TABS: ORAL_TABLET | ORAL | 0 refills | Status: DC

## 2016-06-30 NOTE — Telephone Encounter (Signed)
Pt is requesting a refill on Ritalin. Last OV was on 06/26/15 with CY. He does not have any pending OVs. Last refill was on 05/26/16 #90.  CY - please advise. Thanks.

## 2016-06-30 NOTE — Telephone Encounter (Signed)
Rx left up front in brown folder for pick up. Lm to make pt aware. Pt will need to be scheduled with CY.

## 2016-06-30 NOTE — Telephone Encounter (Signed)
Ok to refill He needs to make a f/u ov please- it has been a year.

## 2016-07-01 NOTE — Telephone Encounter (Signed)
Spoke with the pt  I made him aware that his rx is ready and scheduled f/u  Nothing further needed

## 2016-07-23 ENCOUNTER — Telehealth: Payer: Self-pay | Admitting: Internal Medicine

## 2016-07-23 DIAGNOSIS — G4710 Hypersomnia, unspecified: Secondary | ICD-10-CM

## 2016-07-23 MED ORDER — METHYLPHENIDATE HCL 10 MG PO TABS: ORAL_TABLET | ORAL | 0 refills | Status: DC

## 2016-07-23 NOTE — Telephone Encounter (Signed)
rx printed, signed by CY, and placed up front for pickup.  Pt aware.  Nothing further needed.

## 2016-07-23 NOTE — Telephone Encounter (Signed)
Spoke with the pt  He is req refill on methylphenidate 10 mg  Last given on 06/30/16 #90 tablets  Last ov 06/26/15 and next ov sched 09/02/16  He wants to pick up tomorrow  Please advise thanks!

## 2016-07-23 NOTE — Telephone Encounter (Signed)
Ok to refill 

## 2016-08-27 ENCOUNTER — Telehealth: Payer: Self-pay | Admitting: Internal Medicine

## 2016-08-27 DIAGNOSIS — G4710 Hypersomnia, unspecified: Secondary | ICD-10-CM

## 2016-08-27 MED ORDER — METHYLPHENIDATE HCL 10 MG PO TABS: ORAL_TABLET | ORAL | 0 refills | Status: DC

## 2016-08-27 NOTE — Telephone Encounter (Signed)
Pt requesting refill on Ritalin 10mg . Last refill 07/23/16 #90 with 0 refills, 1-3 tabs qd prn. Pt requesting to pick up rx from office.  Last ov: 06/26/2015 Next ov: 09/02/16  CY ok to refill?  Thanks!

## 2016-08-27 NOTE — Telephone Encounter (Signed)
Ok to refill 

## 2016-08-27 NOTE — Telephone Encounter (Signed)
rx printed, signed by CY, and placed in brown accordion file for pickup.    lmtcb X1 for pt to make aware.

## 2016-08-28 NOTE — Telephone Encounter (Signed)
Pt aware, Nothing further needed.

## 2016-09-02 ENCOUNTER — Ambulatory Visit (INDEPENDENT_AMBULATORY_CARE_PROVIDER_SITE_OTHER): Payer: Self-pay | Admitting: Internal Medicine

## 2016-09-02 ENCOUNTER — Encounter: Payer: Self-pay | Admitting: Internal Medicine

## 2016-09-02 VITALS — BP 130/90 | HR 94 | Ht 69.0 in | Wt 162.6 lb

## 2016-09-02 DIAGNOSIS — J3089 Other allergic rhinitis: Secondary | ICD-10-CM

## 2016-09-02 DIAGNOSIS — G4710 Hypersomnia, unspecified: Secondary | ICD-10-CM

## 2016-09-02 DIAGNOSIS — J302 Other seasonal allergic rhinitis: Secondary | ICD-10-CM

## 2016-09-02 MED ORDER — CITALOPRAM HYDROBROMIDE 20 MG PO TABS: 20.0000 mg | ORAL_TABLET | Freq: Every day | ORAL | 3 refills | Status: DC

## 2016-09-02 NOTE — Patient Instructions (Addendum)
Ok to sign for Sudafed at pharmacy counter to use occasionally for nasal congestion if needed  Script refilling citalopram printed  Ok to continue either ritalin or adderall. We may want to look at increasing the dose next time, if daytime sleepiness remains an issue  We discussed trying a chin strap or mouthpiece from drug store, sold for snoring.   Please call as needed

## 2016-09-02 NOTE — Progress Notes (Signed)
Patient ID: Tommy Ellison, male    DOB: 12-09-1975, 41 y.o.   MRN: [id number]  HPI male never smoker followed for idiopathic hypersomnia, complicated by rhinitis NPSG 04/09/08- AHI 0.2/ hr, desaturation to 90%, body weight 170 lbs ------------------------------------------------------------------------------------------------------  06/26/2015-41 year old male never smoker followed for idiopathic hypersomnia, complicated by rhinitis Meds continue to help.  No new complaints. A shoulder injury last year still hurts some but affects his sleep last now. Nighttime sleep seems appropriate. He does nap occasionally. Without medication oppressive daytime sleepiness remains an issue, still without cataplexy. He has tolerated Adderall and methylphenidate very well, shifting back and forth between them to avoid tolerance. No misuse, no overstimulation.  09/02/2016-41 year old male never smoker followed for idiopathic hypersomnia, complicated by rhinitis FOLLOWS FOR:Pt states his daytime sleepiness remains the same and takes naps occasionally-rare., Ritalin 10 mg 1-3 daily, Adderall 10 mg 1 tab, 3 times daily, Celexa 20 mg He switches back and forth between Ritalin or Adderall to avoid tolerance. Occasional nap can be restful if not working. Not sure if he snores. Has never noted symptoms consistent with cataplexy or sleep paralysis. Says he could sleep at any time if he lay down. Original sleep study did not show an increased REM pressure. He has never had a MSLT. Admits mild seasonal nasal congestion. 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  ROS-see HPI Constitutional:   No-   weight loss, night sweats, fevers, chills, +fatigue, lassitude. HEENT:   + headaches,  No-difficulty swallowing, tooth/dental problems, sore throat,       No-  sneezing, itching, ear ache,  +nasal congestion, post nasal drip,  CV:  No-   chest  pain, orthopnea, PND, swelling in lower extremities, anasarca, dizziness, palpitations Resp: Mild  shortness of breath with exertion or at rest.              No-   productive cough,  No non-productive cough,  No- coughing up of blood.              No-   change in color of mucus.  No- wheezing.   Skin: No-   rash or lesions. GI:  No-   heartburn, indigestion, abdominal pain, nausea, vomiting,  GU:  MS:  +shoulder joint pain or swelling.   Neuro-     nothing unusual Psych:  No- change in mood or affect. No depression or anxiety.  No memory loss.  Objective:   OBJ- Physical Exam General- Alert, Oriented, Affect-appropriate, Distress- none acute.   Not obese Skin- rash-none, lesions- none, excoriation- none Lymphadenopathy- none Head- atraumatic            Eyes- Gross vision intact, PERRLA, conjunctivae and secretions clear            Ears- Hearing, canals-normal            Nose- Clear, no-Septal dev, mucus, polyps, erosion, perforation             Throat- Mallampati II , mucosa clear , drainage- none, tonsils- atrophic Neck- flexible , trachea midline, no stridor , thyroid nl, carotid no bruit Chest - symmetrical excursion , unlabored           Heart/CV- RRR , no murmur , no gallop  , no rub, nl s1 s2                           -  JVD- none , edema- none, stasis changes- none, varices- none           Lung- clear to P&A, wheeze- none, cough- none , dullness-none, rub- none           Chest wall-  Abd- Br/ Gen/ Rectal- Not done, not indicated Extrem- cyanosis- none, clubbing, none, atrophy- none, strength- nl Neuro- grossly intact to observation, no tremor

## 2016-09-04 NOTE — Assessment & Plan Note (Addendum)
He minimizes symptoms concerned so far this spring. No longer working in [employer]. OTC therapies will be appropriate if needed. We did discuss use of Sudafed if needed for retronasal pressure congestion. He stands to watch out for overlap stimulation between Sudafed and his Adderall/Ritalin

## 2016-09-04 NOTE — Assessment & Plan Note (Signed)
This is been most consistent with idiopathic hypersomnia. He does have a component of depression and asks refill for Celexa. He denies suicidal thoughts or concerns. There is never been any concerned about his use of Adderall/Ritalin as he continues alternating between these to avoid tolerance.

## 2016-09-24 ENCOUNTER — Telehealth: Payer: Self-pay | Admitting: Internal Medicine

## 2016-09-24 DIAGNOSIS — G4710 Hypersomnia, unspecified: Secondary | ICD-10-CM

## 2016-09-24 NOTE — Telephone Encounter (Signed)
Ok to refill 

## 2016-09-24 NOTE — Telephone Encounter (Signed)
Pt requesting refill on ritalin 10mg . Last refill: 08/27/16 #90 with 0 refills, take 1-3 tabs qd prn. Pt wishes to pick up rx from office when ready.   CY please advise on refill.  Thanks!

## 2016-09-24 NOTE — Telephone Encounter (Signed)
LMTCB

## 2016-09-25 MED ORDER — METHYLPHENIDATE HCL 10 MG PO TABS: ORAL_TABLET | ORAL | 0 refills | Status: DC

## 2016-09-25 NOTE — Telephone Encounter (Signed)
rx has been printed out and will place this up front. I have called and made the pt aware.

## 2016-10-26 ENCOUNTER — Telehealth: Payer: Self-pay | Admitting: Internal Medicine

## 2016-10-26 DIAGNOSIS — G4710 Hypersomnia, unspecified: Secondary | ICD-10-CM

## 2016-10-26 NOTE — Telephone Encounter (Signed)
Spoke with patient. He is requesting a refill on methlphenidate 10mg . Last OV was on 09/02/16. No future appt. Last refill was on 09/25/16 for 90 tabs.   CY, please advise if ok for patient to receive a refill. Thanks!

## 2016-10-27 MED ORDER — METHYLPHENIDATE HCL 10 MG PO TABS: ORAL_TABLET | ORAL | 0 refills | Status: DC

## 2016-10-27 NOTE — Telephone Encounter (Signed)
Ok to refill. We need to see him once a year please.

## 2016-10-27 NOTE — Telephone Encounter (Signed)
Pt aware of refill.  Recall in system for 07/2017 Pt to come pick this up this afternoon.  Rx signed by CY, placed up front.  Nothing further needed.

## 2016-11-23 ENCOUNTER — Telehealth: Payer: Self-pay | Admitting: Internal Medicine

## 2016-11-23 DIAGNOSIS — G4710 Hypersomnia, unspecified: Secondary | ICD-10-CM

## 2016-11-23 MED ORDER — METHYLPHENIDATE HCL 10 MG PO TABS: ORAL_TABLET | ORAL | 0 refills | Status: DC

## 2016-11-23 NOTE — Telephone Encounter (Signed)
Ok to refill 

## 2016-11-23 NOTE — Telephone Encounter (Signed)
Pt is requesting a refill on Ritalin 10mg . His last OV was on 09/02/16 with CY. Pt is to follow up in 08/2017. Last refill was on 10/27/16 #90.  CY - please advise on refill. Thanks.

## 2016-11-23 NOTE — Telephone Encounter (Signed)
rx printed and placed in brown accordion folder for pickup.  Pt aware.  Nothing further needed.

## 2016-11-24 MED ORDER — METHYLPHENIDATE HCL 10 MG PO TABS: ORAL_TABLET | ORAL | 0 refills | Status: DC

## 2016-11-24 NOTE — Telephone Encounter (Signed)
Had to re print rx today due to original not being up front. CY gave the ok to re print and he re-signed and rx was placed back up front. Tommy Ellison is aware due to the pt calling and saying they were sending someone to pick it up. Nothing further is needed at this time

## 2016-11-24 NOTE — Addendum Note (Signed)
Addended by: Garfield Cornea L on: 11/24/2016 01:28 PM   Modules accepted: Orders

## 2016-12-14 DIAGNOSIS — N410 Acute prostatitis: Secondary | ICD-10-CM | POA: Diagnosis not present

## 2016-12-14 DIAGNOSIS — Z6823 Body mass index (BMI) 23.0-23.9, adult: Secondary | ICD-10-CM | POA: Diagnosis not present

## 2016-12-18 ENCOUNTER — Telehealth: Payer: Self-pay | Admitting: Internal Medicine

## 2016-12-18 MED ORDER — AMPHETAMINE-DEXTROAMPHETAMINE 10 MG PO TABS: 10.0000 mg | ORAL_TABLET | Freq: Three times a day (TID) | ORAL | 0 refills | Status: DC

## 2016-12-18 NOTE — Telephone Encounter (Signed)
SN agreed to sign Rx for patient as CY is out of the office this afternoon. Pt will be here to pick up shortly.

## 2017-01-19 ENCOUNTER — Telehealth: Payer: Self-pay | Admitting: Internal Medicine

## 2017-01-19 DIAGNOSIS — G4710 Hypersomnia, unspecified: Secondary | ICD-10-CM

## 2017-01-19 MED ORDER — METHYLPHENIDATE HCL 10 MG PO TABS: ORAL_TABLET | ORAL | 0 refills | Status: DC

## 2017-01-19 NOTE — Telephone Encounter (Signed)
Spoke with patient-aware that Rx has been printed, signed and at front for pick up. Pt aware to bring ID when picking Rx up. Nothing more needed at this time.

## 2017-02-15 ENCOUNTER — Other Ambulatory Visit: Payer: Self-pay | Admitting: Internal Medicine

## 2017-02-15 DIAGNOSIS — G4710 Hypersomnia, unspecified: Secondary | ICD-10-CM

## 2017-02-15 MED ORDER — METHYLPHENIDATE HCL 10 MG PO TABS: ORAL_TABLET | ORAL | 0 refills | Status: DC

## 2017-02-15 NOTE — Telephone Encounter (Signed)
Called pt letting him know we had the Rx ready for pick-up. Pt stated it would probably be tomorrow when he came to pick Rx up. Will place up front for pt to pick it up. Nothing further needed.

## 2017-02-15 NOTE — Telephone Encounter (Signed)
rx has been printed out and placed on CY cart to be signed.  Will call pt once this is ready to be picked up.   

## 2017-03-16 ENCOUNTER — Telehealth: Payer: Self-pay | Admitting: Internal Medicine

## 2017-03-16 DIAGNOSIS — G4710 Hypersomnia, unspecified: Secondary | ICD-10-CM

## 2017-03-16 MED ORDER — METHYLPHENIDATE HCL 10 MG PO TABS: ORAL_TABLET | ORAL | 0 refills | Status: DC

## 2017-03-16 NOTE — Telephone Encounter (Signed)
Rx has been placed in brown folder up front for pickup. Pt is aware and voiced his understanding. Nothing further needed.

## 2017-03-16 NOTE — Telephone Encounter (Signed)
RX has been printed and placed on CY's cart.   Will await signature before contacting patient.

## 2017-03-16 NOTE — Telephone Encounter (Signed)
Ok to refill 

## 2017-03-16 NOTE — Telephone Encounter (Signed)
Last OV 09/02/2016 Late RX 02/15/2017  Requesting a refill on ritalin 10 mg. Please advise CY.  Current Outpatient Prescriptions on File Prior to Visit  Medication Sig Dispense Refill  . amphetamine-dextroamphetamine (ADDERALL) 10 MG tablet Take 1 tablet (10 mg total) by mouth 3 (three) times daily. 90 tablet 0  . citalopram (CELEXA) 20 MG tablet Take 1 tablet (20 mg total) by mouth daily. 90 tablet 3  . methylphenidate (RITALIN) 10 MG tablet Take 1 to 3 tabs by mouth daily as needed 90 tablet 0  . Multiple Vitamin (MULTIVITAMIN) tablet Take 1 tablet by mouth daily.     No current facility-administered medications on file prior to visit.    No Known Allergies

## 2017-04-21 ENCOUNTER — Telehealth: Payer: Self-pay | Admitting: Internal Medicine

## 2017-04-21 DIAGNOSIS — G4710 Hypersomnia, unspecified: Secondary | ICD-10-CM

## 2017-04-21 MED ORDER — METHYLPHENIDATE HCL 10 MG PO TABS: ORAL_TABLET | ORAL | 0 refills | Status: DC

## 2017-04-21 NOTE — Telephone Encounter (Signed)
Request refill on Ritalin 10mg  and needs the RX today because he is out. CY pleas advise.   Last RX 03/16/2017 Last OV 09/02/2016  Current Outpatient Medications on File Prior to Visit  Medication Sig Dispense Refill  . amphetamine-dextroamphetamine (ADDERALL) 10 MG tablet Take 1 tablet (10 mg total) by mouth 3 (three) times daily. 90 tablet 0  . citalopram (CELEXA) 20 MG tablet Take 1 tablet (20 mg total) by mouth daily. 90 tablet 3  . methylphenidate (RITALIN) 10 MG tablet Take 1 to 3 tabs by mouth daily as needed 90 tablet 0  . Multiple Vitamin (MULTIVITAMIN) tablet Take 1 tablet by mouth daily.     No current facility-administered medications on file prior to visit.    No Known Allergies

## 2017-04-21 NOTE — Telephone Encounter (Signed)
Advised pt Rx ready to pick up. Nothing further is needed.

## 2017-04-21 NOTE — Telephone Encounter (Signed)
Awaiting signature from CY.

## 2017-04-21 NOTE — Telephone Encounter (Signed)
Ok to refill 

## 2017-05-14 ENCOUNTER — Telehealth: Payer: Self-pay | Admitting: Internal Medicine

## 2017-05-14 DIAGNOSIS — G4710 Hypersomnia, unspecified: Secondary | ICD-10-CM

## 2017-05-14 MED ORDER — METHYLPHENIDATE HCL 10 MG PO TABS: ORAL_TABLET | ORAL | 0 refills | Status: DC

## 2017-05-14 NOTE — Telephone Encounter (Signed)
Patient states we returned call and he missed it, CB is [phone]

## 2017-05-14 NOTE — Telephone Encounter (Signed)
Spoke with [facility] and she stated Rx is up front to pick up. Nothing further is needed. I left a detailed voicemail.

## 2017-06-14 ENCOUNTER — Telehealth: Payer: Self-pay | Admitting: Internal Medicine

## 2017-06-14 DIAGNOSIS — G4710 Hypersomnia, unspecified: Secondary | ICD-10-CM

## 2017-06-14 MED ORDER — METHYLPHENIDATE HCL 10 MG PO TABS: ORAL_TABLET | ORAL | 0 refills | Status: DC

## 2017-06-14 NOTE — Telephone Encounter (Signed)
Spoke with patient regarding needing refill on Ritalin 10mg  tablet Routing message to CY to see if okay to refill medication  CY please advise  Current Outpatient Medications on File Prior to Visit  Medication Sig Dispense Refill  . amphetamine-dextroamphetamine (ADDERALL) 10 MG tablet Take 1 tablet (10 mg total) by mouth 3 (three) times daily. 90 tablet 0  . citalopram (CELEXA) 20 MG tablet Take 1 tablet (20 mg total) by mouth daily. 90 tablet 3  . methylphenidate (RITALIN) 10 MG tablet Take 1 to 3 tabs by mouth daily as needed 90 tablet 0  . Multiple Vitamin (MULTIVITAMIN) tablet Take 1 tablet by mouth daily.     No current facility-administered medications on file prior to visit.     No Known Allergies

## 2017-06-14 NOTE — Telephone Encounter (Signed)
Detailed message left for patient that the RX has been placed up front for pickup.

## 2017-06-14 NOTE — Telephone Encounter (Signed)
Spoke with patient regarding needing refill on Ritalin 10mg  tablet Routing message to KW to see if okay to refill medication  KW please advise if okay to refill Ritalin 10mg  tablet

## 2017-06-14 NOTE — Telephone Encounter (Signed)
Ok to refill 

## 2017-06-14 NOTE — Telephone Encounter (Signed)
RX has been printed and placed on CY's chart for a signature.

## 2017-07-14 ENCOUNTER — Telehealth: Payer: Self-pay | Admitting: Internal Medicine

## 2017-07-14 DIAGNOSIS — G4710 Hypersomnia, unspecified: Secondary | ICD-10-CM

## 2017-07-14 MED ORDER — METHYLPHENIDATE HCL 10 MG PO TABS: ORAL_TABLET | ORAL | 0 refills | Status: DC

## 2017-07-14 NOTE — Telephone Encounter (Signed)
Ok to refill 

## 2017-07-14 NOTE — Telephone Encounter (Signed)
Pt requesting to pick up rx of Ritalin 10mg  from office. Last refill: 06/14/17 #90 with 0 refills, take 1-3 tabs qd prn. Last OV: 09/02/16 Next ov: none-recall for 08/2017  CY please advise on refill request.  Thanks!

## 2017-07-14 NOTE — Telephone Encounter (Signed)
rx signed and placed in brown accordion folder for pickup.  Pt aware.  Nothing further needed.

## 2017-08-10 ENCOUNTER — Telehealth: Payer: Self-pay | Admitting: Internal Medicine

## 2017-08-10 DIAGNOSIS — G4710 Hypersomnia, unspecified: Secondary | ICD-10-CM

## 2017-08-10 MED ORDER — METHYLPHENIDATE HCL 10 MG PO TABS: ORAL_TABLET | ORAL | 0 refills | Status: DC

## 2017-08-10 NOTE — Telephone Encounter (Signed)
rx was printed and placed on Dr Roxy Cedar cart to be signed  Please let triage know once signed, thanks!

## 2017-08-10 NOTE — Telephone Encounter (Signed)
Rx refill for Ritalin 10mg   Last OV  09/02/2016 Last refill 07/14/2017  Current Outpatient Medications on File Prior to Visit  Medication Sig Dispense Refill  . amphetamine-dextroamphetamine (ADDERALL) 10 MG tablet Take 1 tablet (10 mg total) by mouth 3 (three) times daily. 90 tablet 0  . citalopram (CELEXA) 20 MG tablet Take 1 tablet (20 mg total) by mouth daily. 90 tablet 3  . methylphenidate (RITALIN) 10 MG tablet Take 1 to 3 tabs by mouth daily as needed 90 tablet 0  . Multiple Vitamin (MULTIVITAMIN) tablet Take 1 tablet by mouth daily.     No current facility-administered medications on file prior to visit.    No Known Allergies

## 2017-08-10 NOTE — Telephone Encounter (Signed)
Rx has been signed and placed at front for pick up. Pt is aware that Rx is at front. Nothing more needed at this time.

## 2017-08-10 NOTE — Telephone Encounter (Signed)
Ok to refill 

## 2017-09-06 ENCOUNTER — Telehealth: Payer: Self-pay | Admitting: Internal Medicine

## 2017-09-06 DIAGNOSIS — G4710 Hypersomnia, unspecified: Secondary | ICD-10-CM

## 2017-09-06 NOTE — Telephone Encounter (Signed)
Attempted to contact pt. I did not receive an answer. There was no option for me to leave a message. Will try back.  

## 2017-09-07 MED ORDER — METHYLPHENIDATE HCL 10 MG PO TABS: ORAL_TABLET | ORAL | 0 refills | Status: DC

## 2017-09-07 NOTE — Telephone Encounter (Signed)
Pt is aware that Rx for Ritalin has been placed up front for pick up.  Pt states he will call back to schedule OV.  Nothing further is needed.

## 2017-09-07 NOTE — Telephone Encounter (Signed)
Called and spoke with pt who stated he is needing a refill of his Ritalin.  Med last refilled for pt 08/10/17, #90tabs with 0RF.  Pt last seen at office 09/02/16 and stated at that OV to return in 1 year for a f/u.  Pt currently does not have a f/u appt scheduled at our office.  Dr. [name], please advise if you are fine with [place] refilling pt's med. Thanks!  No Known Allergies   Current Outpatient Medications:  .  amphetamine-dextroamphetamine (ADDERALL) 10 MG tablet, Take 1 tablet (10 mg total) by mouth 3 (three) times daily., Disp: 90 tablet, Rfl: 0 .  citalopram (CELEXA) 20 MG tablet, Take 1 tablet (20 mg total) by mouth daily., Disp: 90 tablet, Rfl: 3 .  methylphenidate (RITALIN) 10 MG tablet, Take 1 to 3 tabs by mouth daily as needed, Disp: 90 tablet, Rfl: 0 .  Multiple Vitamin (MULTIVITAMIN) tablet, Take 1 tablet by mouth daily., Disp: , Rfl:

## 2017-09-07 NOTE — Telephone Encounter (Signed)
Ok to refill. Please make a next routine available ROV- ok if its a few months away.

## 2017-10-06 ENCOUNTER — Telehealth: Payer: Self-pay | Admitting: Internal Medicine

## 2017-10-06 ENCOUNTER — Telehealth: Payer: Self-pay | Admitting: Family Medicine

## 2017-10-06 DIAGNOSIS — G4710 Hypersomnia, unspecified: Secondary | ICD-10-CM

## 2017-10-06 NOTE — Telephone Encounter (Signed)
error 

## 2017-10-06 NOTE — Telephone Encounter (Addendum)
Called and spoke to pt.  Pt is requesting refill on Ritalin 10mg . Last refilled 09/07/17 #90 with 0 refills.  Pt last seen 09/02/16 with pending OV for 01/12/18, as this is first available.  Pt is requesting to pick Rx up on 10/07/17.    CY please advise on refill.  Current Outpatient Medications on File Prior to Visit  Medication Sig Dispense Refill  . amphetamine-dextroamphetamine (ADDERALL) 10 MG tablet Take 1 tablet (10 mg total) by mouth 3 (three) times daily. 90 tablet 0  . citalopram (CELEXA) 20 MG tablet Take 1 tablet (20 mg total) by mouth daily. 90 tablet 3  . methylphenidate (RITALIN) 10 MG tablet Take 1 to 3 tabs by mouth daily as needed 90 tablet 0  . Multiple Vitamin (MULTIVITAMIN) tablet Take 1 tablet by mouth daily.     No current facility-administered medications on file prior to visit.     No Known Allergies

## 2017-10-07 MED ORDER — METHYLPHENIDATE HCL 10 MG PO TABS: ORAL_TABLET | ORAL | 0 refills | Status: DC

## 2017-10-07 NOTE — Telephone Encounter (Signed)
Ok to refill 

## 2017-10-07 NOTE — Telephone Encounter (Signed)
Called and spoke to the patient. Advised him that Dr. [name] has signed the Rx and that it is ready when he wants to pick it up.   Placed Rx in accordion folder up front. Nothing further is needed at this time.

## 2017-10-25 ENCOUNTER — Telehealth: Payer: Self-pay | Admitting: Internal Medicine

## 2017-10-25 MED ORDER — CITALOPRAM HYDROBROMIDE 20 MG PO TABS: 20.0000 mg | ORAL_TABLET | Freq: Every day | ORAL | 0 refills | Status: DC

## 2017-10-25 NOTE — Telephone Encounter (Signed)
Called and spoke to patient. Patient requesting refill of Celexa. Reminded patient that for further refills he will need to come to his next appointment on 01/12/18. Patient verbalized understanding. Refill sent to patient's preferred pharmacy. Nothing further is needed at this time.

## 2017-11-01 ENCOUNTER — Telehealth: Payer: Self-pay | Admitting: Internal Medicine

## 2017-11-01 DIAGNOSIS — G4710 Hypersomnia, unspecified: Secondary | ICD-10-CM

## 2017-11-01 MED ORDER — METHYLPHENIDATE HCL 10 MG PO TABS: ORAL_TABLET | ORAL | 0 refills | Status: DC

## 2017-11-01 NOTE — Telephone Encounter (Signed)
Per Stann Ore for Ritalin refill.  Ritalin 10mg  #90, no refills. Prescription printed, signed by CY, place in sealed envelope at front desk.  Patient notified prescription ready for pick up. Nothing further at this time.

## 2017-11-01 NOTE — Telephone Encounter (Signed)
CDY- please advise if okay to refill ritalin 10 mg  Last written # 90 tabs on 10/07/17  Last ov 09/02/16  Next ov 01/12/18

## 2017-11-01 NOTE — Telephone Encounter (Signed)
Ok to refill 

## 2017-11-29 ENCOUNTER — Telehealth: Payer: Self-pay | Admitting: Internal Medicine

## 2017-11-29 ENCOUNTER — Telehealth: Payer: Self-pay | Admitting: Pharmacy Technician

## 2017-11-29 DIAGNOSIS — G4710 Hypersomnia, unspecified: Secondary | ICD-10-CM

## 2017-11-29 MED ORDER — METHYLPHENIDATE HCL 10 MG PO TABS: ORAL_TABLET | ORAL | 0 refills | Status: DC

## 2017-11-29 NOTE — Telephone Encounter (Signed)
Pt requesting a refill of Ritalin 10mg  tablet.  Med last refilled 11/01/17, #90 with 0 RF  Pt last seen at office 09/02/16. Pt does have an upcoming appt 01/12/18.  Dr. [name], please advise if it is okay to refill med for pt.   No Known Allergies   Current Outpatient Medications:  .  amphetamine-dextroamphetamine (ADDERALL) 10 MG tablet, Take 1 tablet (10 mg total) by mouth 3 (three) times daily., Disp: 90 tablet, Rfl: 0 .  citalopram (CELEXA) 20 MG tablet, Take 1 tablet (20 mg total) by mouth daily., Disp: 90 tablet, Rfl: 0 .  methylphenidate (RITALIN) 10 MG tablet, Take 1 to 3 tabs by mouth daily as needed, Disp: 90 tablet, Rfl: 0 .  Multiple Vitamin (MULTIVITAMIN) tablet, Take 1 tablet by mouth daily., Disp: , Rfl:

## 2017-11-29 NOTE — Telephone Encounter (Signed)
Ok to refill 

## 2017-11-29 NOTE — Telephone Encounter (Signed)
Rx has been printed, signed and placed up front for pick up. Pt is aware. Nothing further was needed. 

## 2017-11-30 NOTE — Telephone Encounter (Signed)
Encounter seems to have been opened in error. Will close encounter.

## 2017-12-27 ENCOUNTER — Telehealth: Payer: Self-pay | Admitting: Internal Medicine

## 2017-12-27 DIAGNOSIS — G4710 Hypersomnia, unspecified: Secondary | ICD-10-CM

## 2017-12-27 MED ORDER — METHYLPHENIDATE HCL 10 MG PO TABS: ORAL_TABLET | ORAL | 0 refills | Status: DC

## 2017-12-27 NOTE — Telephone Encounter (Signed)
Rx printed and signed by CY.   Pt has been notified that Rx is ready. Pt expressed understanding.  Rx placed upfront in brown accordion folder. Nothing further needed.

## 2017-12-27 NOTE — Telephone Encounter (Signed)
Ok to refill 

## 2017-12-27 NOTE — Telephone Encounter (Signed)
Pt is requesting a refill on Ritalin 10mg . His last OV with CY was on 09/02/16. He has a pending OV with CY on 02/02/18. Ritalin was last refilled on 11/29/17 #90.  CY - please advise on refill. Thanks.

## 2018-01-12 ENCOUNTER — Ambulatory Visit: Payer: Self-pay | Admitting: Internal Medicine

## 2018-01-13 ENCOUNTER — Telehealth: Payer: Self-pay | Admitting: Internal Medicine

## 2018-01-13 MED ORDER — CITALOPRAM HYDROBROMIDE 20 MG PO TABS: 20.0000 mg | ORAL_TABLET | Freq: Every day | ORAL | 0 refills | Status: DC

## 2018-01-13 NOTE — Telephone Encounter (Signed)
Left detailed message for patient explaining that I will go ahead and send in a refill for him.  Will close this encounter.

## 2018-01-25 ENCOUNTER — Telehealth: Payer: Self-pay | Admitting: Internal Medicine

## 2018-01-25 DIAGNOSIS — G4710 Hypersomnia, unspecified: Secondary | ICD-10-CM

## 2018-01-25 MED ORDER — METHYLPHENIDATE HCL 10 MG PO TABS: ORAL_TABLET | ORAL | 0 refills | Status: DC

## 2018-01-25 NOTE — Telephone Encounter (Signed)
Refill written and signed by CY. Called patient and let him know it is available to be picked up. Placed Rx in the accordion folder. Nothing further needed at this time.

## 2018-01-25 NOTE — Telephone Encounter (Signed)
Spoke with pt. He is requesting a refill on Ritalin. His last OV was on 09/02/16. He has a pending OV on 02/02/18. Last refill of Ritalin was on 12/27/17 #90.  CY - please advise on refill. Thanks.

## 2018-01-25 NOTE — Telephone Encounter (Signed)
Ok to refill ritalin 

## 2018-02-02 ENCOUNTER — Ambulatory Visit: Payer: Self-pay | Admitting: Internal Medicine

## 2018-02-04 ENCOUNTER — Ambulatory Visit (INDEPENDENT_AMBULATORY_CARE_PROVIDER_SITE_OTHER): Payer: Self-pay | Admitting: Internal Medicine

## 2018-02-04 ENCOUNTER — Encounter: Payer: Self-pay | Admitting: Internal Medicine

## 2018-02-04 DIAGNOSIS — G4710 Hypersomnia, unspecified: Secondary | ICD-10-CM

## 2018-02-04 DIAGNOSIS — Z23 Encounter for immunization: Secondary | ICD-10-CM

## 2018-02-04 NOTE — Assessment & Plan Note (Signed)
Idiopathic hypersomnia. He denies cataplexy or sleep paralysis and has been stable. Methylphenidate use has been appropriate, without progressive tolerance or concerns. Plan- continue 1 year f/u here. Reminded of basic sleep hygiene guides.

## 2018-02-04 NOTE — Progress Notes (Signed)
Patient ID: Tommy Ellison, male    DOB: 01/22/1976, 42 y.o.   MRN: [id number]  HPI male never smoker followed for idiopathic hypersomnia, complicated by rhinitis NPSG 04/09/08- AHI 0.2/ hr, desaturation to 90%, body weight 170 lbs ------------------------------------------------------------------------------------------------------  09/02/2016-42 year old male never smoker followed for idiopathic hypersomnia, complicated by rhinitis FOLLOWS FOR:Pt states his daytime sleepiness remains the same and takes naps occasionally-rare., Ritalin 10 mg 1-3 daily, Adderall 10 mg 1 tab, 3 times daily, Celexa 20 mg He switches back and forth between Ritalin or Adderall to avoid tolerance. Occasional nap can be restful if not working. Not sure if he snores. Has never noted symptoms consistent with cataplexy or sleep paralysis. Says he could sleep at any time if he lay down. Original sleep study did not show an increased REM pressure. He has never had a MSLT. Admits mild seasonal nasal congestion. 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  02/04/2018- 42 year old male never smoker followed for idiopathic hypersomnia, complicated by rhinitis -----yearly visit Methylphenidate 10 mg 1-3 tabs daily, Ada Rao 10 mg 1 tab 3 times daily- Was switching back and forth between these 2 to avoid tolerance, but generally finds Ritalin sufficient and Adderall is much more expensive.  Notices mild urinary symptoms if he misses a few citalopram doses then restarts, but is ok otherwise. 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 Denies need for referral to establish a PCP.  ROS-see HPI  + = positive Constitutional:   No-   weight loss, night sweats, fevers, chills, +fatigue, lassitude. HEENT:   + headaches,  No-difficulty swallowing, tooth/dental problems, sore throat,       No-  sneezing, itching, ear ache,  +nasal congestion, post nasal drip,   CV:  No-   chest pain, orthopnea, PND, swelling in lower extremities, anasarca, dizziness, palpitations Resp: Mild  shortness of breath with exertion or at rest.              No-   productive cough,  No non-productive cough,  No- coughing up of blood.              No-   change in color of mucus.  No- wheezing.   Skin: No-   rash or lesions. GI:  No-   heartburn, indigestion, abdominal pain, nausea, vomiting,  GU:  MS:  +shoulder joint pain or swelling.   Neuro-     nothing unusual Psych:  No- change in mood or affect. No depression or anxiety.  No memory loss.  Objective:   OBJ- Physical Exam General- Alert, Oriented, Affect-appropriate, Distress- none acute.   Not obese Skin- rash-none, lesions- none, excoriation- none Lymphadenopathy- none Head- atraumatic            Eyes- Gross vision intact, PERRLA, conjunctivae and secretions clear            Ears- Hearing, canals-normal            Nose- Clear, no-Septal dev, mucus, polyps, erosion, perforation             Throat- Mallampati II , mucosa clear , drainage- none, tonsils- atrophic Neck- flexible , trachea midline, no stridor , thyroid nl, carotid no bruit Chest - symmetrical excursion , unlabored           Heart/CV- RRR , no murmur , no gallop  , no rub, nl s1 s2                           -  JVD- none , edema- none, stasis changes- none, varices- none           Lung- clear to P&A, wheeze- none, cough- none , dullness-none, rub- none           Chest wall-  Abd- Br/ Gen/ Rectal- Not done, not indicated Extrem- cyanosis- none, clubbing, none, atrophy- none, strength- nl Neuro- grossly intact to observation, no tremor

## 2018-02-04 NOTE — Patient Instructions (Addendum)
Order- Flu vax  Ok to continue with methylphenidate and to keep Adderall on the books if needed  Please call if we can help

## 2018-02-21 ENCOUNTER — Telehealth: Payer: Self-pay | Admitting: Internal Medicine

## 2018-02-21 DIAGNOSIS — G4710 Hypersomnia, unspecified: Secondary | ICD-10-CM

## 2018-02-21 NOTE — Telephone Encounter (Signed)
Patient requesting refill on Ritalin.   Last filled 01/25/2018 one to two tabs po daily prn #90.   Last OV 02/04/2018  NKDA  CY please advise.

## 2018-02-22 MED ORDER — METHYLPHENIDATE HCL 10 MG PO TABS: ORAL_TABLET | ORAL | 0 refills | Status: DC

## 2018-02-22 NOTE — Telephone Encounter (Signed)
Rx has been printed, signed and placed up front for pick up. Pt is aware. Nothing further is needed at this time.

## 2018-02-22 NOTE — Telephone Encounter (Signed)
Ok to refill 

## 2018-03-18 ENCOUNTER — Telehealth: Payer: Self-pay | Admitting: Internal Medicine

## 2018-03-18 DIAGNOSIS — G4710 Hypersomnia, unspecified: Secondary | ICD-10-CM

## 2018-03-18 NOTE — Telephone Encounter (Signed)
Called and spoke with pt and stated to pt we would call him after we check with CY to see if it would be okay to refill the Rx for him.  Pt expressed understanding.   Pt requesting a refill of Ritalin 10mg  tablet. Pt last seen at office 02/04/18 and med last filled 02/22/18, #90 tabs with 0 RF.  Dr. [name], please advise if it is okay to refill med for pt. Thanks!  No Known Allergies   Current Outpatient Medications:  .  amphetamine-dextroamphetamine (ADDERALL) 10 MG tablet, Take 1 tablet (10 mg total) by mouth 3 (three) times daily., Disp: 90 tablet, Rfl: 0 .  citalopram (CELEXA) 20 MG tablet, Take 1 tablet (20 mg total) by mouth daily., Disp: 90 tablet, Rfl: 0 .  methylphenidate (RITALIN) 10 MG tablet, Take 1 to 3 tabs by mouth daily as needed, Disp: 90 tablet, Rfl: 0 .  Multiple Vitamin (MULTIVITAMIN) tablet, Take 1 tablet by mouth daily., Disp: , Rfl:

## 2018-03-21 MED ORDER — METHYLPHENIDATE HCL 10 MG PO TABS: ORAL_TABLET | ORAL | 0 refills | Status: DC

## 2018-03-21 NOTE — Telephone Encounter (Signed)
Ok to refill 

## 2018-03-21 NOTE — Telephone Encounter (Signed)
Script printed and placed upfront. Called and made patient aware. Nothing further is needed at this time.

## 2018-04-08 DIAGNOSIS — M2550 Pain in unspecified joint: Secondary | ICD-10-CM | POA: Diagnosis not present

## 2018-04-08 DIAGNOSIS — N411 Chronic prostatitis: Secondary | ICD-10-CM | POA: Diagnosis not present

## 2018-04-08 DIAGNOSIS — R030 Elevated blood-pressure reading, without diagnosis of hypertension: Secondary | ICD-10-CM | POA: Diagnosis not present

## 2018-04-08 DIAGNOSIS — D649 Anemia, unspecified: Secondary | ICD-10-CM | POA: Diagnosis not present

## 2018-04-08 DIAGNOSIS — F1010 Alcohol abuse, uncomplicated: Secondary | ICD-10-CM | POA: Diagnosis not present

## 2018-04-19 ENCOUNTER — Telehealth: Payer: Self-pay | Admitting: Internal Medicine

## 2018-04-19 DIAGNOSIS — G4710 Hypersomnia, unspecified: Secondary | ICD-10-CM

## 2018-04-19 NOTE — Telephone Encounter (Signed)
Patient request refill for Ritalin 10 mg  Last OV 02/04/2018 Last refill 03/21/2018   Current Outpatient Medications on File Prior to Visit  Medication Sig Dispense Refill  . amphetamine-dextroamphetamine (ADDERALL) 10 MG tablet Take 1 tablet (10 mg total) by mouth 3 (three) times daily. 90 tablet 0  . citalopram (CELEXA) 20 MG tablet Take 1 tablet (20 mg total) by mouth daily. 90 tablet 0  . methylphenidate (RITALIN) 10 MG tablet Take 1 to 3 tabs by mouth daily as needed 90 tablet 0  . Multiple Vitamin (MULTIVITAMIN) tablet Take 1 tablet by mouth daily.     No current facility-administered medications on file prior to visit.    No Known Allergies

## 2018-04-20 NOTE — Telephone Encounter (Signed)
CY please advise, thank you! 

## 2018-04-21 NOTE — Telephone Encounter (Signed)
Ok to refill 

## 2018-04-22 MED ORDER — METHYLPHENIDATE HCL 10 MG PO TABS: ORAL_TABLET | ORAL | 0 refills | Status: DC

## 2018-04-22 NOTE — Telephone Encounter (Signed)
Thanks [employer], Rx printed and signed. I called pt to let him him know Rx was ready to pick up. Nothing further is needed.

## 2018-04-22 NOTE — Telephone Encounter (Signed)
Patient is calling wanting to pick up RX for Ritalin today, CB is [phone].

## 2018-04-22 NOTE — Telephone Encounter (Signed)
Im fine with it, no refill. Can you print and ill sign

## 2018-04-22 NOTE — Telephone Encounter (Signed)
CY is not here today to sign Rx. I called pt to advise him but he stated he needed the Rx today because he will run out. Can an app sign this Rx?

## 2018-04-25 ENCOUNTER — Telehealth: Payer: Self-pay | Admitting: Internal Medicine

## 2018-04-25 NOTE — Telephone Encounter (Signed)
Last OV: 02/04/18  Last refilled: 01/13/18  Pt aware that I will need to get approval for refill.   CY Please advise.

## 2018-04-25 NOTE — Telephone Encounter (Signed)
Ok to refill x 12 months 

## 2018-04-26 MED ORDER — CITALOPRAM HYDROBROMIDE 20 MG PO TABS: 20.0000 mg | ORAL_TABLET | Freq: Every day | ORAL | 3 refills | Status: DC

## 2018-04-26 NOTE — Telephone Encounter (Signed)
Left message for patient that Rx has been called to pharmacy.

## 2018-04-27 DIAGNOSIS — R194 Change in bowel habit: Secondary | ICD-10-CM | POA: Diagnosis not present

## 2018-04-27 DIAGNOSIS — R1310 Dysphagia, unspecified: Secondary | ICD-10-CM | POA: Diagnosis not present

## 2018-04-27 DIAGNOSIS — K219 Gastro-esophageal reflux disease without esophagitis: Secondary | ICD-10-CM | POA: Diagnosis not present

## 2018-04-27 DIAGNOSIS — D509 Iron deficiency anemia, unspecified: Secondary | ICD-10-CM | POA: Diagnosis not present

## 2018-05-19 ENCOUNTER — Telehealth: Payer: Self-pay | Admitting: Internal Medicine

## 2018-05-19 DIAGNOSIS — G4710 Hypersomnia, unspecified: Secondary | ICD-10-CM

## 2018-05-19 NOTE — Telephone Encounter (Signed)
Called and spoke with pt. Pt is needing a refill of his methylphenidate 10mg  tablet. Pt last seen by CY 02/04/18 and told to return for an appt 1 year from that appt. Pt's med was last refilled 04/22/18, #90 tablets for pt to take 1 to 3 tabs by mouth daily as needed.  Stated to pt that CY was out of the office but would return to office tomorrow, 12/27. Pt was wanting to know if another provider would okay the refill for pt today.  Sending to APP of day. Tonya, please advise if you are okay with electronically sending Rx to pt's pharmacy using the thumbprint and sending it to [facility] in [place] off of [address] or if you want CY to address this tomorrow, 12/27 when he returns to office?

## 2018-05-19 NOTE — Telephone Encounter (Signed)
Please have Dr. [name] refill tomorrow.

## 2018-05-19 NOTE — Telephone Encounter (Signed)
Noted. Called and spoke with pt letting him know that this is going to have to be addressed by CY tomorrow, 05/20/18. Pt expressed understanding.  Dr. [name], are you okay with pt having his methylphenidate 10mg  tablet refilled with same instructions as last time. If you could electronically send this to pt's pharmacy of [facility] in [place] off of [address] for pt and let us know when this has been taken care of so we can make pt aware. Thank you!

## 2018-05-20 MED ORDER — METHYLPHENIDATE HCL 10 MG PO TABS: ORAL_TABLET | ORAL | 0 refills | Status: DC

## 2018-05-20 NOTE — Telephone Encounter (Signed)
Called and spoke with pt letting him know this had been done. Pt expressed understanding. Nothing further needed.

## 2018-05-20 NOTE — Telephone Encounter (Signed)
Done

## 2018-06-14 ENCOUNTER — Telehealth: Payer: Self-pay | Admitting: Internal Medicine

## 2018-06-14 MED ORDER — AMPHETAMINE-DEXTROAMPHETAMINE 10 MG PO TABS: 10.0000 mg | ORAL_TABLET | Freq: Three times a day (TID) | ORAL | 0 refills | Status: DC

## 2018-06-14 NOTE — Telephone Encounter (Signed)
Called and spoke with Patient.  He is requesting a refill for methylphenidate, 1-3 tabs daily, as needed.  Last refill was 05/20/18, #90, with no refills.  Patients preferred pharmacy is [facility].  Will route to Dr. [name]

## 2018-06-14 NOTE — Telephone Encounter (Signed)
Adderall refill e-script sent

## 2018-06-21 DIAGNOSIS — K2930 Chronic superficial gastritis without bleeding: Secondary | ICD-10-CM | POA: Diagnosis not present

## 2018-06-21 DIAGNOSIS — D127 Benign neoplasm of rectosigmoid junction: Secondary | ICD-10-CM | POA: Diagnosis not present

## 2018-06-21 DIAGNOSIS — K648 Other hemorrhoids: Secondary | ICD-10-CM | POA: Diagnosis not present

## 2018-06-21 DIAGNOSIS — D509 Iron deficiency anemia, unspecified: Secondary | ICD-10-CM | POA: Diagnosis not present

## 2018-06-21 DIAGNOSIS — R1314 Dysphagia, pharyngoesophageal phase: Secondary | ICD-10-CM | POA: Diagnosis not present

## 2018-06-21 DIAGNOSIS — K210 Gastro-esophageal reflux disease with esophagitis: Secondary | ICD-10-CM | POA: Diagnosis not present

## 2018-06-28 DIAGNOSIS — D127 Benign neoplasm of rectosigmoid junction: Secondary | ICD-10-CM | POA: Diagnosis not present

## 2018-06-28 DIAGNOSIS — K2930 Chronic superficial gastritis without bleeding: Secondary | ICD-10-CM | POA: Diagnosis not present

## 2018-06-28 DIAGNOSIS — K210 Gastro-esophageal reflux disease with esophagitis: Secondary | ICD-10-CM | POA: Diagnosis not present

## 2018-07-11 ENCOUNTER — Telehealth: Payer: Self-pay | Admitting: Internal Medicine

## 2018-07-11 DIAGNOSIS — G4710 Hypersomnia, unspecified: Secondary | ICD-10-CM

## 2018-07-11 MED ORDER — METHYLPHENIDATE HCL 10 MG PO TABS: ORAL_TABLET | ORAL | 0 refills | Status: DC

## 2018-07-11 NOTE — Telephone Encounter (Signed)
Call made to patient, made aware refill has been sent in. Informed patient to give [place] a call back if he needed anything else. Voiced understanding. Nothing further is needed at this time.

## 2018-07-11 NOTE — Telephone Encounter (Signed)
Requested refill e-sent 

## 2018-07-11 NOTE — Telephone Encounter (Signed)
Pt requesting a refill on Methylphenidate.   Last refill: 05/20/2018 Pharmacy: [facility] [address]  CY please advise on refill.  Thanks!

## 2018-07-13 DIAGNOSIS — G629 Polyneuropathy, unspecified: Secondary | ICD-10-CM | POA: Diagnosis not present

## 2018-07-13 DIAGNOSIS — K279 Peptic ulcer, site unspecified, unspecified as acute or chronic, without hemorrhage or perforation: Secondary | ICD-10-CM | POA: Diagnosis not present

## 2018-07-13 DIAGNOSIS — J029 Acute pharyngitis, unspecified: Secondary | ICD-10-CM | POA: Diagnosis not present

## 2018-07-13 DIAGNOSIS — K635 Polyp of colon: Secondary | ICD-10-CM | POA: Diagnosis not present

## 2018-08-04 ENCOUNTER — Telehealth: Payer: Self-pay

## 2018-08-04 NOTE — Telephone Encounter (Signed)
Patient called requesting a refill of Methlyphenidate 10mg   Last OV: 02/04/2018 Last refill: Methylphenidate 10mg  1-3 tabs po daily prn #90  Pharmacy: [facility] [address] in [place]  Call back # [phone]

## 2018-08-04 NOTE — Telephone Encounter (Signed)
Spoke with pt and he stated he needed a refill of methylphenidate 10mg  tablets #90 take 1-3 tabs daily prn.  [facility] [address] in [place]. Last OV 02/05/19  Current Outpatient Medications on File Prior to Visit  Medication Sig Dispense Refill  . amphetamine-dextroamphetamine (ADDERALL) 10 MG tablet Take 1 tablet (10 mg total) by mouth 3 (three) times daily. 90 tablet 0  . citalopram (CELEXA) 20 MG tablet Take 1 tablet (20 mg total) by mouth daily. 90 tablet 3  . methylphenidate (RITALIN) 10 MG tablet Take 1 to 3 tabs by mouth daily as needed 90 tablet 0  . Multiple Vitamin (MULTIVITAMIN) tablet Take 1 tablet by mouth daily.     No current facility-administered medications on file prior to visit.     Last refill 07/11/2018  Could you send in an electronic refill, Dr. [name]?

## 2018-08-05 MED ORDER — AMPHETAMINE-DEXTROAMPHETAMINE 10 MG PO TABS: 10.0000 mg | ORAL_TABLET | Freq: Three times a day (TID) | ORAL | 0 refills | Status: DC

## 2018-08-05 NOTE — Telephone Encounter (Signed)
Adderall refill 3e-sent

## 2018-08-30 ENCOUNTER — Telehealth: Payer: Self-pay | Admitting: Internal Medicine

## 2018-08-30 DIAGNOSIS — G4710 Hypersomnia, unspecified: Secondary | ICD-10-CM

## 2018-08-30 MED ORDER — METHYLPHENIDATE HCL 10 MG PO TABS: ORAL_TABLET | ORAL | 0 refills | Status: DC

## 2018-08-30 NOTE — Telephone Encounter (Signed)
Patient would like a refill on Methylphenidate.  Last saw on 02/04/18  1 year follow. Would like this sent to [facility].  Cy please advise.

## 2018-08-30 NOTE — Telephone Encounter (Signed)
Pt aware rx sent.  

## 2018-08-30 NOTE — Telephone Encounter (Signed)
Generic ritalin refill e-sent

## 2018-09-26 ENCOUNTER — Telehealth: Payer: Self-pay | Admitting: Internal Medicine

## 2018-09-26 DIAGNOSIS — G4710 Hypersomnia, unspecified: Secondary | ICD-10-CM

## 2018-09-26 MED ORDER — METHYLPHENIDATE HCL 10 MG PO TABS: ORAL_TABLET | ORAL | 0 refills | Status: DC

## 2018-09-26 NOTE — Telephone Encounter (Signed)
Pt aware. Nothing further needed 

## 2018-09-26 NOTE — Telephone Encounter (Signed)
Methylphenidate refill e-sent 

## 2018-09-26 NOTE — Telephone Encounter (Signed)
Pt requesting refills: Methylphenidate 10 mg Pharmacy: [facility] [address] [facility]  Instructions      Return in about 1 year (around 02/05/2019).  Order- Flu vax  Ok to continue with methylphenidate and to keep Adderall on the books if needed  Please call if we can help

## 2018-10-20 ENCOUNTER — Other Ambulatory Visit: Payer: Self-pay | Admitting: Internal Medicine

## 2018-10-20 ENCOUNTER — Telehealth: Payer: Self-pay | Admitting: Internal Medicine

## 2018-10-20 DIAGNOSIS — G4710 Hypersomnia, unspecified: Secondary | ICD-10-CM

## 2018-10-20 MED ORDER — METHYLPHENIDATE HCL 10 MG PO TABS: ORAL_TABLET | ORAL | 0 refills | Status: DC

## 2018-10-20 NOTE — Telephone Encounter (Signed)
Called and spoke with pt letting him know that Rx for the 10 tabs he was not able to receive from previous Rx was sent to pharmacy and pt verbalized understanding. Nothing further needed.

## 2018-10-20 NOTE — Telephone Encounter (Signed)
A refill of pt's methylphenidate 10mg  was sent to pharmacy for pt by CY 09/26/2018 for a quantity of 90tabs. Called and spoke with pt in regards to this and pt stated when the Rx was sent in on 5/4, the pharmacy did not have enough to be able to fill the entire amount and stated that it would be a few days before they would be able to have more Rx to take care of the remainder of the Rx. Pt stated he had received around 80tabs when the Rx was filled and stated he was told by pharmacy staff that they would approve the Rx once sent in for the remainder of the Rx that was missing. I stated to pt that I was going to have to call pharmacy for more clarification and once we spoke to them, we would send this to our provider for review and then call him back once all had been handled and pt verbalized understanding.  Called pt's pharmacy and spoke with pharmacist Santina Evans in regards to the above info and Santina Evans stated to me on May 6 when pt picked up the Rx for methylphenidate, he was told by the pharmacy that they did not have the entire amount for the quantity of 90tabs which was prescribed. Santina Evans stated to me on May 30, pt would be able to pick up Rx whether we wanted to send in an Rx for the 10tabs that pt was missing from the previous Rx that had been picked up or could receive the full 90tabs which is usually prescribed forpt.  Tommy Ellison, based on this info, please advise if you want me to pend Rx for the 10tabs that pt was not able to receive or if you want me to pend Rx for the full 90tabs which is usually prescribed for pt and I will take care of pending the Rx. Thanks!

## 2018-10-20 NOTE — Telephone Encounter (Signed)
Pt calling today requesting a refill on Ritalin 10 mg which CY refilled on 5/4 for #90. Called [facility] spoke with pharmacist on 5/6 quantity of 80 dispensed of #90 rx. It was explained to pt the other #10 would be null and void.  Pt takes 1 cap three times daily. He says he only has 1.5 days left.

## 2018-10-20 NOTE — Telephone Encounter (Signed)
I made duplicate encounter so Arlys John can be able to review med history so closing this encounter.

## 2018-10-20 NOTE — Telephone Encounter (Signed)
Please append this to me as a medication so I can review it through [facility] PMP aware.  [place] [place] and [place] are both aware of how to do this.  Arlys John

## 2018-10-20 NOTE — Telephone Encounter (Signed)
Rx for the 10 tabs has been pended. Routing back to [place] for review.

## 2018-10-20 NOTE — Telephone Encounter (Signed)
Please route to app of the day

## 2018-10-20 NOTE — Telephone Encounter (Signed)
Thank you for evaluating this.  You can pend the order for the 10 tablets.  Arlys John

## 2018-10-25 ENCOUNTER — Telehealth: Payer: Self-pay

## 2018-10-25 MED ORDER — AMPHETAMINE-DEXTROAMPHETAMINE 10 MG PO TABS: 10.0000 mg | ORAL_TABLET | Freq: Three times a day (TID) | ORAL | 0 refills | Status: DC

## 2018-10-25 NOTE — Telephone Encounter (Signed)
Adderall refill for 90 tabs e-sent

## 2018-10-25 NOTE — Telephone Encounter (Signed)
Spoke with patient. He is aware that CY has called in his medication. Verbalized understanding.   Nothing further needed at time of call.

## 2018-10-25 NOTE — Telephone Encounter (Signed)
Pt called 5/28 asking for a quantity of 10 tabs of his Ritalin to be sent to pharmacy due to him only able to receive a quantity of 80tabs on 5/6 instead of the full Rx of 90tabs due to the pharmacy not having enough to dispense the entire Rx.   Due to the call we received from pt on 5/28, after speaking with pt I called pt's pharmacy and spoke with pharmacist Santina Evans who confirmed this was correct. Santina Evans had stated to me by May 30, pt could be able to pick up another Ritalin Rx whether it be the 10 tabs he was missing from the previous Rx that was sent in on 5/6 or we could send in the full Rx for the 90tabs as what is usually sent in for pt. Since CY was out of the office, Arlys John who had seen pt at a previous visit prior to pt seeing CY handled this and said for [place] to pend Rx for 10 tabs of Ritlin since this is what was missing from the previous Rx.  Pt now calling office requesting to have his Ritalin refilled for the full 90tabs. Dr. [name], please advise if you are okay sending this to pharmacy for pt. Pharmacy this needs to be sent to is [facility] in [place].  No Known Allergies   Current Outpatient Medications:  .  amphetamine-dextroamphetamine (ADDERALL) 10 MG tablet, Take 1 tablet (10 mg total) by mouth 3 (three) times daily., Disp: 90 tablet, Rfl: 0 .  citalopram (CELEXA) 20 MG tablet, Take 1 tablet (20 mg total) by mouth daily., Disp: 90 tablet, Rfl: 3 .  methylphenidate (RITALIN) 10 MG tablet, Take 1 to 3 tabs by mouth daily as needed, Disp: 90 tablet, Rfl: 0 .  Multiple Vitamin (MULTIVITAMIN) tablet, Take 1 tablet by mouth daily., Disp: , Rfl:

## 2018-11-17 ENCOUNTER — Telehealth: Payer: Self-pay | Admitting: Internal Medicine

## 2018-11-17 DIAGNOSIS — G4710 Hypersomnia, unspecified: Secondary | ICD-10-CM

## 2018-11-17 MED ORDER — METHYLPHENIDATE HCL 10 MG PO TABS: ORAL_TABLET | ORAL | 0 refills | Status: DC

## 2018-11-17 MED ORDER — AMPHETAMINE-DEXTROAMPHETAMINE 10 MG PO TABS: 10.0000 mg | ORAL_TABLET | Freq: Three times a day (TID) | ORAL | 0 refills | Status: DC

## 2018-11-17 NOTE — Telephone Encounter (Signed)
Called and spoke with Patient.  Patient requesting a refill of Ritalin/methylphenidate 10mg , take 1 -3 tabs daily as needed, to [facility] [place]. Last refill was #90, 09/26/18.  Last OV was 02/04/18, with CY.    Message routed to Dr Annamaria Boots, to advise  No Known Allergies Current Outpatient Medications on File Prior to Visit  Medication Sig Dispense Refill  . amphetamine-dextroamphetamine (ADDERALL) 10 MG tablet Take 1 tablet (10 mg total) by mouth 3 (three) times daily. 90 tablet 0  . citalopram (CELEXA) 20 MG tablet Take 1 tablet (20 mg total) by mouth daily. 90 tablet 3  . methylphenidate (RITALIN) 10 MG tablet Take 1 to 3 tabs by mouth daily as needed 90 tablet 0  . Multiple Vitamin (MULTIVITAMIN) tablet Take 1 tablet by mouth daily.     No current facility-administered medications on file prior to visit.

## 2018-12-09 ENCOUNTER — Telehealth: Payer: Self-pay | Admitting: Internal Medicine

## 2018-12-09 MED ORDER — AMPHETAMINE-DEXTROAMPHETAMINE 10 MG PO TABS: 10.0000 mg | ORAL_TABLET | Freq: Three times a day (TID) | ORAL | 0 refills | Status: DC

## 2018-12-09 NOTE — Telephone Encounter (Signed)
Called & spoke to pt regarding his medication refill. Pt verbalized understanding with no additional questions. Nothing further needed at this time.

## 2018-12-09 NOTE — Telephone Encounter (Signed)
Methylphenidate refill e-sent 

## 2018-12-09 NOTE — Telephone Encounter (Signed)
Pt calling requesting to have his methylphenidate refilled. Pt last seen at office 02/04/18, med last filled for pt 11/17/2018 #90 with 0 RF. Dr. Annamaria Boots, please advise if you are okay refilling this. Preferred pharmacy is [facility].  No Known Allergies   Current Outpatient Medications:  .  amphetamine-dextroamphetamine (ADDERALL) 10 MG tablet, Take 1 tablet (10 mg total) by mouth 3 (three) times daily., Disp: 90 tablet, Rfl: 0 .  citalopram (CELEXA) 20 MG tablet, Take 1 tablet (20 mg total) by mouth daily., Disp: 90 tablet, Rfl: 3 .  methylphenidate (RITALIN) 10 MG tablet, Take 1 to 3 tabs by mouth daily as needed, Disp: 90 tablet, Rfl: 0 .  Multiple Vitamin (MULTIVITAMIN) tablet, Take 1 tablet by mouth daily., Disp: , Rfl:

## 2019-01-09 ENCOUNTER — Telehealth: Payer: Self-pay | Admitting: Internal Medicine

## 2019-01-09 DIAGNOSIS — G4710 Hypersomnia, unspecified: Secondary | ICD-10-CM

## 2019-01-09 MED ORDER — METHYLPHENIDATE HCL 10 MG PO TABS: ORAL_TABLET | ORAL | 0 refills | Status: DC

## 2019-01-09 NOTE — Telephone Encounter (Signed)
Ritalin refill e-sent 

## 2019-01-09 NOTE — Telephone Encounter (Signed)
Pt calling to have his Ritalin refilled. Pt last seen 02/04/2018 by CY. Ritalin last refilled 12/09/2018.   Current Outpatient Medications on File Prior to Visit  Medication Sig Dispense Refill  . amphetamine-dextroamphetamine (ADDERALL) 10 MG tablet Take 1 tablet (10 mg total) by mouth 3 (three) times daily. 90 tablet 0  . citalopram (CELEXA) 20 MG tablet Take 1 tablet (20 mg total) by mouth daily. 90 tablet 3  . methylphenidate (RITALIN) 10 MG tablet Take 1 to 3 tabs by mouth daily as needed 90 tablet 0  . Multiple Vitamin (MULTIVITAMIN) tablet Take 1 tablet by mouth daily.     No current facility-administered medications on file prior to visit.    No Known Allergies   CY, please advise on the refill of this medication. Pt does not currently have a future appointment with [facility]. After your advisory we would be happy to create and appt for him with you or with an APP depending on your preference.

## 2019-02-06 ENCOUNTER — Telehealth: Payer: Self-pay | Admitting: Internal Medicine

## 2019-02-06 DIAGNOSIS — G4710 Hypersomnia, unspecified: Secondary | ICD-10-CM

## 2019-02-06 MED ORDER — METHYLPHENIDATE HCL 10 MG PO TABS: ORAL_TABLET | ORAL | 0 refills | Status: DC

## 2019-02-06 NOTE — Telephone Encounter (Signed)
Pt requesting refill of Ritalin 10mg  to [facility] in [place]. Pt last seen 02/04/2018 by CY for hypersomnia. Ritalin last refilled 01/09/2019.  Current Outpatient Medications on File Prior to Visit  Medication Sig Dispense Refill  . amphetamine-dextroamphetamine (ADDERALL) 10 MG tablet Take 1 tablet (10 mg total) by mouth 3 (three) times daily. 90 tablet 0  . citalopram (CELEXA) 20 MG tablet Take 1 tablet (20 mg total) by mouth daily. 90 tablet 3  . methylphenidate (RITALIN) 10 MG tablet Take 1 to 3 tabs by mouth daily as needed 90 tablet 0  . Multiple Vitamin (MULTIVITAMIN) tablet Take 1 tablet by mouth daily.     No current facility-administered medications on file prior to visit.    No Known Allergies   CY, please advise on the refill of this medication. Will make a 1 year follow-up appt for pt with an APP.

## 2019-02-06 NOTE — Telephone Encounter (Signed)
Ritalin refill e-sent Please make him a 1 year f/u visit with me or NP

## 2019-02-06 NOTE — Telephone Encounter (Signed)
Called and spoke with pt regarding CY's refill and to create a 1 year f/u appt. Pt states he is waiting to hear back from another provider about an appt, but he agreed to setting up an appt for Wednesday 02/08/2019 at 11:30 AM EDT with TP. Pt states if anything changes he will give our office a call back. Appt has been scheduled. Nothing further needed at this time.

## 2019-02-08 ENCOUNTER — Other Ambulatory Visit: Payer: Self-pay

## 2019-02-08 ENCOUNTER — Ambulatory Visit (INDEPENDENT_AMBULATORY_CARE_PROVIDER_SITE_OTHER): Payer: Self-pay | Admitting: Adult Health

## 2019-02-08 ENCOUNTER — Encounter: Payer: Self-pay | Admitting: Adult Health

## 2019-02-08 DIAGNOSIS — G4710 Hypersomnia, unspecified: Secondary | ICD-10-CM

## 2019-02-08 NOTE — Patient Instructions (Signed)
Continue on Ritalin 10mg  Three times a day  .  Take naps as able  Do not drive if sleepy  Follow up with Dr. Annamaria Boots  In 1 year and As needed

## 2019-02-08 NOTE — Assessment & Plan Note (Signed)
Improved symptom control on Ritalin  Patient education given   Plan  Patient Instructions  Continue on Ritalin 10mg  Three times a day  .  Take naps as able  Do not drive if sleepy  Follow up with Dr. Annamaria Boots  In 1 year and As needed

## 2019-02-08 NOTE — Progress Notes (Signed)
@Patient  ID: Tommy Ellison, male    DOB: [birth year], 43 y.o.   MRN: [id number]  Chief Complaint  Patient presents with  . Follow-up    Hypersomnia     Referring provider: No ref. provider found  HPI: 43 year old male never smoker followed for idiopathic hypersomnia  TEST/EVENTS :  NPSG 04/09/08- AHI 0.2/ hr, desaturation to 90%, body weight 170 lbs  02/08/2019 Follow up : Idiopathic Hypersomnia  Patient presents for a one-year follow-up.  Patient has idiopathic hypersomnia. He is on Ritalin 10 mg Three times a day  . On occasion take Adderall 10 mg  Three times a day  About once a year.  He does this to avoid tolerance.  He takes an occasional nap.  Says overall his daytime sleepiness is about the same. The Ritalin really helps.  No chest pain or palpitations .   Recently diagnosed with esophageal ulcers . Now on Protonix .   PMP reviewed , appears with appropriate refills.     No Known Allergies  Immunization History  Administered Date(s) Administered  . Influenza,inj,Quad PF,6+ Mos 02/04/2018    Past Medical History:  Diagnosis Date  . Anxiety   . Hypersomnia   . Rhinitis     Tobacco History: Social History   Tobacco Use  Smoking Status Never Smoker  Smokeless Tobacco Never Used   Counseling given: Not Answered   Outpatient Medications Prior to Visit  Medication Sig Dispense Refill  . amphetamine-dextroamphetamine (ADDERALL) 10 MG tablet Take 1 tablet (10 mg total) by mouth 3 (three) times daily. 90 tablet 0  . citalopram (CELEXA) 20 MG tablet Take 1 tablet (20 mg total) by mouth daily. 90 tablet 3  . ferrous sulfate 325 (65 FE) MG tablet Take 325 mg by mouth 2 (two) times daily with a meal.    . methylphenidate (RITALIN) 10 MG tablet Take 1 to 3 tabs by mouth daily as needed 90 tablet 0  . Multiple Vitamin (MULTIVITAMIN) tablet Take 1 tablet by mouth daily.    . pantoprazole (PROTONIX) 40 MG tablet Take 40 mg by mouth daily.     No  facility-administered medications prior to visit.      Review of Systems:   Constitutional:   No  weight loss, night sweats,  Fevers, chills,  +fatigue, or  lassitude.  HEENT:   No headaches,  Difficulty swallowing,  Tooth/dental problems, or  Sore throat,                No sneezing, itching, ear ache, nasal congestion, post nasal drip,   CV:  No chest pain,  Orthopnea, PND, swelling in lower extremities, anasarca, dizziness, palpitations, syncope.   GI  No heartburn, indigestion, abdominal pain, nausea, vomiting, diarrhea, change in bowel habits, loss of appetite, bloody stools.   Resp: No shortness of breath with exertion or at rest.  No excess mucus, no productive cough,  No non-productive cough,  No coughing up of blood.  No change in color of mucus.  No wheezing.  No chest wall deformity  Skin: no rash or lesions.  GU: no dysuria, change in color of urine, no urgency or frequency.  No flank pain, no hematuria   MS:  No joint pain or swelling.  No decreased range of motion.  No back pain.    Physical Exam  BP 130/72 (BP Location: Left Arm, Cuff Size: Normal)   Pulse 99   Temp (!) 97 F (36.1 C) (Temporal)   Ht 5\' 9"  (  1.753 m)   Wt 157 lb 3.2 oz (71.3 kg)   SpO2 98%   BMI 23.21 kg/m   GEN: A/Ox3; pleasant , NAD, well nourished    HEENT:  [place]/AT,   NOSE-clear, THROAT-clear, no lesions, no postnasal drip or exudate noted.   NECK:  Supple w/ fair ROM; no JVD; normal carotid impulses w/o bruits; no thyromegaly or nodules palpated; no lymphadenopathy.    RESP  Clear  P & A; w/o, wheezes/ rales/ or rhonchi. no accessory muscle use, no dullness to percussion  CARD:  RRR, no m/r/g, no peripheral edema, pulses intact, no cyanosis or clubbing.  GI:   Soft & nt; nml bowel sounds; no organomegaly or masses detected.   Musco: Warm bil, no deformities or joint swelling noted.   Neuro: alert, no focal deficits noted.    Skin: Warm, no lesions or rashes    Lab Results:    BMET   BNP No results found for: BNP  ProBNP Imaging: No results found.    No flowsheet data found.  No results found for: NITRICOXIDE      Assessment & Plan:   Hypersomnia Improved symptom control on Ritalin  Patient education given   Plan  Patient Instructions  Continue on Ritalin 10mg  Three times a day  .  Take naps as able  Do not drive if sleepy  Follow up with Dr. [name]  In 1 year and As needed           Rubye Oaks, NP 02/08/2019

## 2019-02-14 DIAGNOSIS — E876 Hypokalemia: Secondary | ICD-10-CM | POA: Diagnosis not present

## 2019-02-14 DIAGNOSIS — G629 Polyneuropathy, unspecified: Secondary | ICD-10-CM | POA: Diagnosis not present

## 2019-03-02 ENCOUNTER — Telehealth: Payer: Self-pay | Admitting: Internal Medicine

## 2019-03-02 DIAGNOSIS — G4710 Hypersomnia, unspecified: Secondary | ICD-10-CM

## 2019-03-02 MED ORDER — METHYLPHENIDATE HCL 10 MG PO TABS: ORAL_TABLET | ORAL | 0 refills | Status: DC

## 2019-03-02 NOTE — Telephone Encounter (Signed)
Ritalin refill e-sent 

## 2019-03-02 NOTE — Telephone Encounter (Signed)
Call returned to patient, confirmed DOB, made aware his refill has been sent in. Voiced understanding. Nothing further needed at this time.

## 2019-03-02 NOTE — Telephone Encounter (Signed)
Refill request for Ritalin 10mg . CY please advise   Last OV 02/08/2019 Last RX 02/06/2019  Current Outpatient Medications on File Prior to Visit  Medication Sig Dispense Refill  . amphetamine-dextroamphetamine (ADDERALL) 10 MG tablet Take 1 tablet (10 mg total) by mouth 3 (three) times daily. 90 tablet 0  . citalopram (CELEXA) 20 MG tablet Take 1 tablet (20 mg total) by mouth daily. 90 tablet 3  . ferrous sulfate 325 (65 FE) MG tablet Take 325 mg by mouth 2 (two) times daily with a meal.    . methylphenidate (RITALIN) 10 MG tablet Take 1 to 3 tabs by mouth daily as needed 90 tablet 0  . Multiple Vitamin (MULTIVITAMIN) tablet Take 1 tablet by mouth daily.    . pantoprazole (PROTONIX) 40 MG tablet Take 40 mg by mouth daily.     No current facility-administered medications on file prior to visit.    No Known Allergies

## 2019-03-24 ENCOUNTER — Telehealth: Payer: Self-pay | Admitting: Internal Medicine

## 2019-03-24 MED ORDER — CITALOPRAM HYDROBROMIDE 20 MG PO TABS: 20.0000 mg | ORAL_TABLET | Freq: Every day | ORAL | 3 refills | Status: DC

## 2019-03-24 NOTE — Telephone Encounter (Signed)
Citalopram refill e-sent

## 2019-03-24 NOTE — Telephone Encounter (Signed)
Called and spoke w/ pt regarding his Citalopram refill per CY. Pt verbalized understanding with no additional questions. Nothing further needed at this time.

## 2019-03-24 NOTE — Telephone Encounter (Signed)
Pt calling requesting an Rx refill of citalopram (Celexa) 20 mg by mouth daily. Pt last seen 02/08/2019 by TP. Citalopram last filled 04/26/2018 90 tablet x3 refills.   Current Outpatient Medications on File Prior to Visit  Medication Sig Dispense Refill  . amphetamine-dextroamphetamine (ADDERALL) 10 MG tablet Take 1 tablet (10 mg total) by mouth 3 (three) times daily. 90 tablet 0  . citalopram (CELEXA) 20 MG tablet Take 1 tablet (20 mg total) by mouth daily. 90 tablet 3  . ferrous sulfate 325 (65 FE) MG tablet Take 325 mg by mouth 2 (two) times daily with a meal.    . methylphenidate (RITALIN) 10 MG tablet Take 1 to 3 tabs by mouth daily as needed 90 tablet 0  . Multiple Vitamin (MULTIVITAMIN) tablet Take 1 tablet by mouth daily.    . pantoprazole (PROTONIX) 40 MG tablet Take 40 mg by mouth daily.     No current facility-administered medications on file prior to visit.    No Known Allergies   CY, please advise on the refill of this medication. Thank you.

## 2019-04-03 ENCOUNTER — Telehealth: Payer: Self-pay | Admitting: Internal Medicine

## 2019-04-03 DIAGNOSIS — G4710 Hypersomnia, unspecified: Secondary | ICD-10-CM

## 2019-04-03 MED ORDER — METHYLPHENIDATE HCL 10 MG PO TABS: ORAL_TABLET | ORAL | 0 refills | Status: DC

## 2019-04-03 NOTE — Telephone Encounter (Signed)
Methylphenidate refill e-sent 

## 2019-04-03 NOTE — Telephone Encounter (Signed)
Please advise, pt is requesting a refill on ritalin to [facility] [address] in [place], thanks Last appt 02/08/19 with TP

## 2019-05-02 ENCOUNTER — Telehealth: Payer: Self-pay | Admitting: Internal Medicine

## 2019-05-02 DIAGNOSIS — G4710 Hypersomnia, unspecified: Secondary | ICD-10-CM

## 2019-05-02 NOTE — Telephone Encounter (Signed)
Pt called requesting to have his generic ritalin refilled.   Dr. Annamaria Boots, please advise.  No Known Allergies   Current Outpatient Medications:  .  amphetamine-dextroamphetamine (ADDERALL) 10 MG tablet, Take 1 tablet (10 mg total) by mouth 3 (three) times daily., Disp: 90 tablet, Rfl: 0 .  citalopram (CELEXA) 20 MG tablet, Take 1 tablet (20 mg total) by mouth daily., Disp: 90 tablet, Rfl: 3 .  ferrous sulfate 325 (65 FE) MG tablet, Take 325 mg by mouth 2 (two) times daily with a meal., Disp: , Rfl:  .  methylphenidate (RITALIN) 10 MG tablet, Take 1 to 3 tabs by mouth daily as needed, Disp: 90 tablet, Rfl: 0 .  Multiple Vitamin (MULTIVITAMIN) tablet, Take 1 tablet by mouth daily., Disp: , Rfl:  .  pantoprazole (PROTONIX) 40 MG tablet, Take 40 mg by mouth daily., Disp: , Rfl:

## 2019-05-03 MED ORDER — METHYLPHENIDATE HCL 10 MG PO TABS: ORAL_TABLET | ORAL | 0 refills | Status: DC

## 2019-05-03 NOTE — Telephone Encounter (Signed)
Ritalin refill e-sent.  He also has Adderall ( amphetamine) on his list. He doesn't need to be taking both. Please verify that he is not using adderall and remove it from his list, or let me know if there is a question. Thanks.

## 2019-05-03 NOTE — Telephone Encounter (Signed)
Attempted to call pt but unable to reach and unable to leave a VM due to mailbox being full. Will try to call back later. 

## 2019-05-09 ENCOUNTER — Telehealth: Payer: Self-pay | Admitting: Internal Medicine

## 2019-05-09 MED ORDER — METHYLPHENIDATE HCL 10 MG PO TABS: 10.0000 mg | ORAL_TABLET | Freq: Two times a day (BID) | ORAL | 0 refills | Status: DC

## 2019-05-09 NOTE — Telephone Encounter (Signed)
Spoke with [facility]. He stated that the patient will need a refill for the remainder of his Ritalin prescription. Advised him that CY sent a RX for 90tabs on 12/9. Elita Quick stated that the patient only received 59 tablets instead of 90 tablets. Elita Quick stated that the RX that sent over had a start date of 12/9 and end date of 12/9 therefore it was expired.    CY, please advise if you are willing to write another RX for the 31 tablets to [facility] in [place] on [address].

## 2019-05-09 NOTE — Telephone Encounter (Signed)
Supplemental script for 31 tabs of ritalin 10 mg e-sent

## 2019-05-10 NOTE — Telephone Encounter (Signed)
Pharmacy aware of refill. Nothing further needed at this time- will close encounter.

## 2019-09-26 DIAGNOSIS — G629 Polyneuropathy, unspecified: Secondary | ICD-10-CM | POA: Diagnosis not present

## 2019-11-02 ENCOUNTER — Ambulatory Visit: Payer: Self-pay | Admitting: Neurology

## 2019-11-02 ENCOUNTER — Encounter: Payer: Self-pay | Admitting: Neurology

## 2019-11-02 VITALS — BP 119/80 | HR 81 | Ht 69.0 in | Wt 158.0 lb

## 2019-11-02 DIAGNOSIS — G629 Polyneuropathy, unspecified: Secondary | ICD-10-CM

## 2019-11-02 DIAGNOSIS — R200 Anesthesia of skin: Secondary | ICD-10-CM

## 2019-11-02 MED ORDER — GABAPENTIN 300 MG PO CAPS: ORAL_CAPSULE | ORAL | 5 refills | Status: DC

## 2019-11-02 NOTE — Progress Notes (Signed)
[facility]  PATIENT: Tommy Ellison DOB: 1976-01-29  REFERRING DOCTOR OR PCP: Aura Dials, MD SOURCE: Patient, notes from primary care  _________________________________   HISTORICAL  CHIEF COMPLAINT:  Chief Complaint  Patient presents with  . New Patient (Initial Visit)    RM 51, with mother. 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 He has app on phone that puts what we are speaking into writing for him. Paper referral from Dr. Sheryn Bison for peripheral neuropathy. He had fall in January and fell and hit his head. Has had sx since then. Has pain in left knee and has a hard time walking. 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 Mother wants to discuss options for helping with this.    HISTORY OF PRESENT ILLNESS:  I had the pleasure seeing your patient, Tommy Ellison, at [facility] for neurologic consultation regarding his numbness.  He is deaf but is able to understand me through a phone app.  His mother is also present.  He is a 44 year old man with progressive numbness and pain in his legs.    Symptoms started after he fell and hit his head last Fall 2020.  He notes mild weakness in his legs but not in his hands.   The numbness has a sensation of cold, pins and needles.  Pain is worse in his feet.   He has milder symptoms in his hands.  He also has pain in his knees.   He notes no bladder changes.  He used to drink every day -- about 5 beers a day and is drinking less now.      He has no history of diabetes or other sgnificant medical problems.   I reviewed lab work from primary care.  B12 and TSH were normal.  REVIEW OF SYSTEMS: Constitutional: No fevers, chills, sweats, or change in appetite Eyes: No visual changes, double vision, eye pain Ear, nose and throat: No hearing loss, ear pain, nasal congestion, sore throat Cardiovascular: No chest pain,  palpitations Respiratory: No shortness of breath at rest or with exertion.   No wheezes GastrointestinaI: No nausea, vomiting, diarrhea, abdominal pain, fecal incontinence Genitourinary: No dysuria, urinary retention or frequency.  No nocturia. Musculoskeletal: No neck pain, back pain Integumentary: No rash, pruritus, skin lesions Neurological: as above Psychiatric: No depression at this time.  No anxiety Endocrine: No palpitations, diaphoresis, change in appetite, change in weigh or increased thirst Hematologic/Lymphatic: No anemia, purpura, petechiae. Allergic/Immunologic: No itchy/runny eyes, nasal congestion, recent allergic reactions, rashes  ALLERGIES: No Known Allergies  HOME MEDICATIONS:  Current Outpatient Medications:  .  acetaminophen (TYLENOL) 500 MG tablet, Take 500 mg by mouth every 6 (six) hours as needed., Disp: , Rfl:  .  Cholecalciferol (VITAMIN D3 PO), Take 5,000 Units by mouth daily., Disp: , Rfl:  .  Cyanocobalamin (VITAMIN B-12 PO), Take 1,000 mcg by mouth daily., Disp: , Rfl:  .  Ferrous Sulfate (IRON PO), Take 65 mg by mouth 2 (two) times daily., Disp: , Rfl:  .  ferrous sulfate 325 (65 FE) MG tablet, Take 325 mg by mouth 2 (two) times daily with a meal., Disp: , Rfl:  .  FOLIC ACID PO, Take 528 mcg by mouth daily., Disp: , Rfl:  .  gabapentin (NEURONTIN) 300 MG capsule, One po qAM, one po qPM and 2 po qHS, Disp: 120 capsule, Rfl: 5 .  Multiple Vitamin (MULTIVITAMIN) tablet, Take 1 tablet by mouth daily., Disp: , Rfl:  .  Probiotic Product (PROBIOTIC PO), Take 1 Dose by mouth daily., Disp: , Rfl:  .  Thiamine HCl (VITAMIN B-1 PO), Take 500 mg by mouth daily., Disp: , Rfl:  .  citalopram (CELEXA) 20 MG tablet, Take 1 tablet (20 mg total) by mouth daily., Disp: 90 tablet, Rfl: 3 .  pantoprazole (PROTONIX) 40 MG tablet, Take 40 mg by mouth daily. (Patient not taking: Reported on 11/02/2019), Disp: , Rfl:   PAST MEDICAL HISTORY: Past Medical History:  Diagnosis  Date  . Anxiety   . Hypersomnia   . Rhinitis     PAST SURGICAL HISTORY: Past Surgical History:  Procedure Laterality Date  . TOE SURGERY    . WISDOM TOOTH EXTRACTION      FAMILY HISTORY: Family History  Problem Relation Age of Onset  . Arthritis Other   . Depression Mother     SOCIAL HISTORY:  Social History   Socioeconomic History  . Marital status: Married    Spouse name: Not on file  . Number of children: Not on file  . Years of education: Not on file  . Highest education level: Not on file  Occupational History  . Not on file  Tobacco Use  . Smoking status: Never Smoker  . Smokeless tobacco: Never Used  Substance and Sexual Activity  . Alcohol use: Yes    Comment: daily   . Drug use: No  . Sexual activity: Not on file  Other Topics Concern  . Not on file  Social History Narrative   Right handed   2 cokes per day   Social Determinants of Health   Financial Resource Strain:   . Difficulty of Paying Living Expenses:   Food Insecurity:   . Worried About Charity fundraiser in the Last Year:   . Arboriculturist in the Last Year:   Transportation Needs:   . Film/video editor (Medical):   Marland Kitchen Lack of Transportation (Non-Medical):   Physical Activity:   . Days of Exercise per Week:   . Minutes of Exercise per Session:   Stress:   . Feeling of Stress :   Social Connections:   . Frequency of Communication with Friends and Family:   . Frequency of Social Gatherings with Friends and Family:   . Attends Religious Services:   . Active Member of Clubs or Organizations:   . Attends Archivist Meetings:   Marland Kitchen Marital Status:   Intimate Partner Violence:   . Fear of Current or Ex-Partner:   . Emotionally Abused:   Marland Kitchen Physically Abused:   . Sexually Abused:      PHYSICAL EXAM  Vitals:   11/02/19 1248  BP: 119/80  Pulse: 81  SpO2: 98%  Weight: 158 lb (71.7 kg)  Height: '5\' 9"'  (1.753 m)    Body mass index is 23.33 kg/m.   General: The  patient is well-developed and well-nourished and in no acute distress  HEENT:  Head is [place]/AT.  Sclera are anicteric.  Funduscopic exam shows normal optic discs and retinal vessels.  Neck: No carotid bruits are noted.  The neck is nontender.  Cardiovascular: The heart has a regular rate and rhythm with a normal S1 and S2. There were no murmurs, gallops or rubs.    Skin: Extremities are without rash or  edema.  Musculoskeletal:  Back is nontender  Neurologic Exam  Mental status: The patient  is alert and oriented x 3 at the time of the examination. The patient has apparent normal recent and remote memory, with an apparently normal attention span and concentration ability.   Speech is normal.  Cranial nerves: Extraocular movements are full. Pupils are equal, round, and reactive to light and accomodation.  Visual fields are full.  Facial symmetry is present. There is good facial sensation to soft touch bilaterally.Facial strength is normal.  Trapezius and sternocleidomastoid strength is normal. No dysarthria is noted.  The tongue is midline, and the patient has symmetric elevation of the soft palate. No obvious hearing deficits are noted.  Motor:  Muscle bulk is normal.   Tone is normal. Strength is  5 / 5 in all 4 extremities except 4+/5 in the intrinsic foot muscles and EHL muscles.   Sensory: Sensory examination, he has only about 10% vibration and pinprick sensation at the toes and 25 to 50% sensation at the ankles and normal sensation at the knees.  Sensation was normal in the hands.  Coordination: Cerebellar testing reveals good finger-nose-finger and heel-to-shin bilaterally.  Gait and station: Station is normal.   Gait is normal. Tandem gait is mildly wide.  Romberg is borderline..   Reflexes: Deep tendon reflexes are symmetric and normal bilaterally.   Plantar responses are flexor.      ASSESSMENT AND PLAN  Polyneuropathy - Plan: Multiple Myeloma Panel (SPEP&IFE w/QIG),  Cryoglobulin, Sjogren's syndrome antibods(ssa + ssb), Vitamin B1, NCV with EMG(electromyography)  Numbness   In summary, Mr. Ciavarella is a 44 year old man with progressive numbness over the past 8-9 months, in the feet much more than in the hands.  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  The etiology is uncertain and we will check blood work to assess for autoimmune disorders and set up an NCV/EMG to further characterize.  To help with the pain I will increase the gabapentin from 100 g p.o. 3 times daily to 300 mg p.o. 3-4 times a day.  I will see him when he returns for the NCV/EMG study.  He should call sooner if he has new or worsening neurologic symptoms.  Thank you for asking me to see Mr. Rosengrant.  Please let me know if I can be of further assistance with him or other patients in the future.   Doralee Kocak A. Felecia Shelling, MD, [facility] 1/91/4782, 9:56 PM Certified in Neurology, Clinical Neurophysiology, Sleep Medicine and Neuroimaging  [facility] [address] [place], [place] [address] [phone]

## 2019-11-06 ENCOUNTER — Other Ambulatory Visit: Payer: Self-pay | Admitting: Neurology

## 2019-11-06 DIAGNOSIS — R768 Other specified abnormal immunological findings in serum: Secondary | ICD-10-CM | POA: Insufficient documentation

## 2019-11-06 DIAGNOSIS — G629 Polyneuropathy, unspecified: Secondary | ICD-10-CM

## 2019-11-06 LAB — MULTIPLE MYELOMA PANEL, SERUM
Albumin SerPl Elph-Mcnc: 4.7 g/dL — ABNORMAL HIGH (ref 2.9–4.4)
Albumin/Glob SerPl: 1.7 (ref 0.7–1.7)
Alpha 1: 0.2 g/dL (ref 0.0–0.4)
Alpha2 Glob SerPl Elph-Mcnc: 0.7 g/dL (ref 0.4–1.0)
B-Globulin SerPl Elph-Mcnc: 0.9 g/dL (ref 0.7–1.3)
Gamma Glob SerPl Elph-Mcnc: 1 g/dL (ref 0.4–1.8)
Globulin, Total: 2.8 g/dL (ref 2.2–3.9)
IgA/Immunoglobulin A, Serum: 210 mg/dL (ref 90–386)
IgG (Immunoglobin G), Serum: 835 mg/dL (ref 603–1613)
IgM (Immunoglobulin M), Srm: 235 mg/dL — ABNORMAL HIGH (ref 20–172)
Total Protein: 7.5 g/dL (ref 6.0–8.5)

## 2019-11-06 LAB — VITAMIN B1: Thiamine: 129.4 nmol/L (ref 66.5–200.0)

## 2019-11-06 LAB — SJOGREN'S SYNDROME ANTIBODS(SSA + SSB)
ENA SSA (RO) Ab: <0.2 AI (ref 0.0–0.9)
ENA SSB (LA) Ab: <0.2 AI (ref 0.0–0.9)

## 2019-11-06 LAB — CRYOGLOBULIN

## 2019-11-14 ENCOUNTER — Telehealth: Payer: Self-pay

## 2019-11-14 NOTE — Telephone Encounter (Signed)
I called pt. No answer, left a message asking pt to call me back.   

## 2019-11-14 NOTE — Telephone Encounter (Signed)
-----   Message from Geronimo Running, RN sent at 11/07/2019  8:41 AM EDT -----  ----- Message ----- From: Asa Lente, MD Sent: 11/06/2019   6:19 PM EDT To: Geronimo Running, RN  The patient is deaf so you may need to go through his mom if he does not have a device in his phone to transcribe.  The one lab was a little bit off showing that he had a higher than normal amount of antibodies in his blood.  This is not always abnormal but I would like to check some additional blood work.  I placed the orders if he could stop by sometime in the next couple weeks during office hours.  I will see him when he returns for the nerve conduction study.

## 2019-11-14 NOTE — Telephone Encounter (Signed)
Pt's mom called me back and we reviewed labs results. She verbalized understanding and will have pt come in for f/u labs.

## 2019-11-21 ENCOUNTER — Ambulatory Visit: Payer: Self-pay | Admitting: Neurology

## 2019-12-11 ENCOUNTER — Other Ambulatory Visit: Payer: Self-pay

## 2019-12-11 ENCOUNTER — Other Ambulatory Visit: Payer: Medicaid Other

## 2019-12-12 ENCOUNTER — Encounter: Payer: Medicaid Other | Admitting: Neurology

## 2019-12-18 ENCOUNTER — Telehealth: Payer: Self-pay | Admitting: Neurology

## 2019-12-18 NOTE — Telephone Encounter (Signed)
I called mother back. She states pt came 12/11/19 to complete labs. Advised I do not see any results in our system. I see where he checked in, but appears they were not drawn. I verified with Mick Sell that they were not drawn. Mother states son was in a bad mood and did not want to wear a mask. She was not sure if he had labs drawn or not. Aware he will need to come back to complete still. Nothing further needed.

## 2019-12-18 NOTE — Telephone Encounter (Signed)
Pt's mother (on [place]) is asking for a call with results to lab work.  Please call

## 2020-03-06 ENCOUNTER — Other Ambulatory Visit (INDEPENDENT_AMBULATORY_CARE_PROVIDER_SITE_OTHER): Payer: Self-pay

## 2020-03-06 DIAGNOSIS — G629 Polyneuropathy, unspecified: Secondary | ICD-10-CM

## 2020-03-06 DIAGNOSIS — R768 Other specified abnormal immunological findings in serum: Secondary | ICD-10-CM

## 2020-03-06 DIAGNOSIS — Z0289 Encounter for other administrative examinations: Secondary | ICD-10-CM

## 2020-03-12 LAB — HEPATITIS B SURFACE ANTIGEN: Hepatitis B Surface Ag: NEGATIVE

## 2020-03-12 LAB — HEPATITIS B CORE ANTIBODY, TOTAL: Hep B Core Total Ab: NEGATIVE

## 2020-03-12 LAB — HEPATITIS B SURFACE ANTIBODY,QUALITATIVE: Hep B Surface Ab, Qual: NONREACTIVE

## 2020-03-12 LAB — ANTI-MYELIN ASSOC GLYCOP IGG: Anti-Myelin Assoc Glycop IgG: <1:10 {titer}

## 2020-03-12 LAB — HEPATITIS C ANTIBODY: Hep C Virus Ab: <0.1 s/co ratio (ref 0.0–0.9)

## 2020-04-23 ENCOUNTER — Encounter: Payer: Medicaid Other | Admitting: Neurology

## 2020-04-30 ENCOUNTER — Encounter: Payer: Medicaid Other | Admitting: Neurology

## 2020-04-30 ENCOUNTER — Encounter: Payer: Self-pay | Admitting: Neurology

## 2020-05-28 ENCOUNTER — Telehealth: Payer: Self-pay

## 2020-05-28 NOTE — Telephone Encounter (Signed)
Pt's mother left a VM this morning asking for a call to schedule pt's NCV/EMG. Please call.

## 2020-05-31 ENCOUNTER — Other Ambulatory Visit: Payer: Self-pay | Admitting: Neurology

## 2020-06-18 ENCOUNTER — Encounter: Payer: Medicaid Other | Admitting: Neurology

## 2020-06-18 ENCOUNTER — Encounter: Payer: Self-pay | Admitting: Neurology

## 2020-06-25 ENCOUNTER — Encounter: Payer: Self-pay | Admitting: Neurology

## 2020-06-25 ENCOUNTER — Ambulatory Visit (INDEPENDENT_AMBULATORY_CARE_PROVIDER_SITE_OTHER): Payer: Medicaid Other | Admitting: Neurology

## 2020-06-25 ENCOUNTER — Encounter: Payer: Medicaid Other | Admitting: Neurology

## 2020-06-25 ENCOUNTER — Other Ambulatory Visit: Payer: Self-pay

## 2020-06-25 DIAGNOSIS — R768 Other specified abnormal immunological findings in serum: Secondary | ICD-10-CM | POA: Diagnosis not present

## 2020-06-25 DIAGNOSIS — Z0289 Encounter for other administrative examinations: Secondary | ICD-10-CM

## 2020-06-25 DIAGNOSIS — R200 Anesthesia of skin: Secondary | ICD-10-CM | POA: Diagnosis not present

## 2020-06-25 DIAGNOSIS — G629 Polyneuropathy, unspecified: Secondary | ICD-10-CM

## 2020-06-25 MED ORDER — GABAPENTIN 600 MG PO TABS: 600.0000 mg | ORAL_TABLET | Freq: Three times a day (TID) | ORAL | 5 refills | Status: DC

## 2020-06-25 NOTE — Progress Notes (Signed)
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  Additionally he notes weakness in his legs.  Examination, he had 80% vibration sensation at the knees, 30% vibration sensation at the ankles and 10% vibration sensation at the toes.  Additionally there was 80% sensation at the fingertips.  There was reduced pinprick sensation below mid shin.  Strength was normal in the arms and proximal legs and 4+/5 in toe and ankle extensors.  Deep tendon reflexes were 2 in the arms but absent in the legs.  Nerve conduction studies: The left median and ulnar motor responses had normal distal latencies and amplitudes but reduced conduction velocity.  The peroneal and tibial motor responses had normal distal latencies but markedly reduced amplitude and conduction velocities.  The left tibial F-wave response was absent in the left ulnar F-wave latency was prolonged.   The left radial sensory response had a delayed peak latency and mildly reduced amplitude.  The left ulnar sensory response was absent.  The left median sensory response had a delayed peak latency and reduced amplitude.  The sural and superficial peroneal sensory responses were absent.  The galvanic sympathetic skin response was blunted.  Electromyography: Needle EMG of selected muscles of the left arm and leg was performed.  There was mild chronic denervation in the left gastrocnemius muscle, mild mixed acute and chronic denervation in the left abductor hallucis muscle and moderate chronic denervation in the left extensor digitorum brevis muscle.  Other muscles tested in the arm and leg were normal.  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  Monquie Fulgham A. Felecia Shelling, MD, PhD, FAAN Certified in Neurology, Clinical Neurophysiology, Sleep Medicine, Pain Medicine and Neuroimaging Director, [place] [facility] [address] [place], [place] [address] [phone]    Verbal informed consent was obtained from the patient, patient was informed of potential risk of procedure, including bruising, bleeding, hematoma formation, infection, muscle weakness, muscle pain, numbness, among others.        [facility]    Nerve / Sites Muscle Latency Ref. Amplitude Ref. Rel Amp Segments Distance Velocity Ref. Area    ms ms mV mV %  cm m/s m/s mVms  L Median - APB     Wrist APB 3.9 ?4.4 12.1 ?4.0 100 Wrist - APB 7   49.3     Upper arm APB 10.0  7.8  64.7 Upper arm - Wrist 22 36 ?49 32.1  L Ulnar - ADM     Wrist ADM 3.3 ?3.3 5.8 ?6.0 100 Wrist - ADM 7   20.6     B.Elbow ADM 7.9  3.6  61.8 B.Elbow - Wrist 21 45 ?49 17.6     A.Elbow ADM 10.3  3.9  109 A.Elbow - B.Elbow 10 42 ?49 24.5  L Peroneal - EDB     Ankle EDB 5.3 ?6.5 0.4 ?2.0 100 Ankle - EDB 9   2.5     Fib head EDB 16.0  0.3  75.5 Fib head - Ankle 29 27 ?44 1.8     Pop fossa EDB 20.3  0.3  106 Pop fossa - Fib head 10 23 ?44 1.8         Pop fossa - Ankle      L Tibial - AH     Ankle AH 3.8 ?5.8 0.5 ?4.0 100 Ankle - AH 9   2.4     Pop fossa AH 18.9  0.3  55.1 Pop fossa - Ankle 34 23 ?41 1.8             SSR    Nerve / Sites Latency   s  L Sympathetic - Foot     Foot 3.23        SNC    Nerve / Sites Rec. Site Peak Lat Ref.  Amp Ref. Segments Distance    ms ms V V  cm  L Radial - Anatomical snuff box (Forearm)     Forearm Wrist 3.5 ?2.9 13 ?15 Forearm - Wrist 10  L Sural - Ankle (Calf)     Calf Ankle NR ?4.4 NR ?6 Calf - Ankle 14  L Superficial peroneal - Ankle     Lat leg Ankle NR ?4.4 NR ?6 Lat leg - Ankle 14  L Median -  Orthodromic (Dig II, Mid palm)     Dig II Wrist 3.8 ?3.4 7 ?10 Dig II - Wrist 13  L Ulnar - Orthodromic, (Dig V, Mid palm)     Dig V Wrist NR ?3.1 NR ?5 Dig V - Wrist 59               F  Wave    Nerve F Lat Ref.   ms ms  L Tibial - AH NR ?56.0  L Ulnar - ADM 41.8 ?32.0         EMG Summary Table    Spontaneous MUAP Recruitment  Muscle IA Fib PSW Fasc Other Amp Dur. Poly Pattern  L. Deltoid Normal None None None _______ Normal Normal Normal Normal  L. triceps Normal None None None _______ Normal Normal Normal Normal  L Biceps  Normal None None None _______ Normal Normal Normal Normal  L. EDC Normal None None None _______ Normal Normal Normal Normal  L. Abd Poll. Br. Normal None None None _______ Normal Normal Normal Normal  L. First DIO Normal None None None _______ Normal Normal Normal Normal  L. Vastus medialis Normal None None None _______ Normal Normal Normal Normal  L. Peroneus longus Normal None None None _______ Normal Normal Normal Normal  L. Gastrocnemius (Medial head) Normal None None None _______ Increased Increased 1+ Reduced  L. Tibialis anterior Normal None None None _______ Normal Normal Normal Normal  L. Abductor hallucis Normal 1+ 1+ None _______ Normal Increased 1+ Reduced  L. Iliopsoas Normal None None None _______ Normal Normal Normal Normal  L. Extensor digitorum brevis Normal None None None _______ Normal Increased 3+ Discrete  L. First dorsal interosseous Normal None None None _______ Normal Normal Normal Normal          [facility]  PATIENT: ERBY SANDERSON DOB: 03/31/1976  REFERRING DOCTOR OR PCP: Aura Dials, MD SOURCE: Patient, notes from primary care  _________________________________   HISTORICAL  CHIEF COMPLAINT:  Chief Complaint  Patient presents with  . Peripheral Neuropathy    HISTORY OF PRESENT ILLNESS:  Tommy Ellison is a 45 year old man with progressive numbness and pain in his legs.    He noted the symptoms  began around September or October 2020 and he had a fall  at that time.  They have continue to mildly progressed since. He notes mild weakness in his legs but not in his hands.   The numbness has a sensation of cold, pins and needles.  Pain is worse in his feet.   He has milder symptoms in his hands.  He also has pain in his knees.   He notes no bladder changes.  He has a history of moderate alcohol abuse but drinks much less over the past couple of years.  He has dysesthetic pain in the feet.  Gabapentin helps slightly and current dose is 300 mg 3 times daily.  I initially saw him 11/02/2019.  SPEP/IEF showed a polyclonal increase in IgM.  SSA/SSB, cryoglobulins and thiamine were normal.   Anti-mag IgM was negative.  Hepatitis labs were negative.  An NCV/EMG was ordered but he did not have it done.  He has no history of diabetes or other sgnificant medical problems.   I reviewed lab work from primary care.  B12 and TSH were normal.   REVIEW OF SYSTEMS: Constitutional: No fevers, chills, sweats, or change in appetite Eyes: No visual changes, double vision, eye pain Ear, nose and throat: No hearing loss, ear pain, nasal congestion, sore throat Cardiovascular: No chest pain, palpitations Respiratory: No shortness of breath at rest or with exertion.   No wheezes GastrointestinaI: No nausea, vomiting, diarrhea, abdominal pain, fecal incontinence Genitourinary: No dysuria, urinary retention or frequency.  No nocturia. Musculoskeletal: No neck pain, back pain Integumentary: No rash, pruritus, skin lesions Neurological: as above Psychiatric: No depression at this time.  No anxiety Endocrine: No palpitations, diaphoresis, change in appetite, change in weigh or increased thirst Hematologic/Lymphatic: No anemia, purpura, petechiae. Allergic/Immunologic: No itchy/runny eyes, nasal congestion, recent allergic reactions, rashes  ALLERGIES: No Known Allergies  HOME MEDICATIONS:  Current Outpatient  Medications:  .  gabapentin (NEURONTIN) 600 MG tablet, Take 1 tablet (600 mg total) by mouth 3 (three) times daily., Disp: 90 tablet, Rfl: 5 .  acetaminophen (TYLENOL) 500 MG tablet, Take 500 mg by mouth every 6 (six) hours as needed., Disp: , Rfl:  .  Cholecalciferol (VITAMIN D3 PO), Take 5,000 Units by mouth daily., Disp: , Rfl:  .  citalopram (CELEXA) 20 MG tablet, Take 1 tablet (20 mg total) by mouth daily., Disp: 90 tablet, Rfl: 3 .  Cyanocobalamin (VITAMIN B-12 PO), Take 1,000 mcg by mouth daily., Disp: , Rfl:  .  Ferrous Sulfate (IRON PO), Take 65 mg by mouth 2 (two) times daily., Disp: , Rfl:  .  ferrous sulfate 325 (65 FE) MG tablet, Take 325 mg by mouth 2 (two) times daily with a meal., Disp: , Rfl:  .  FOLIC ACID PO, Take 615 mcg by mouth daily., Disp: , Rfl:  .  Multiple Vitamin (MULTIVITAMIN) tablet, Take 1 tablet by mouth daily., Disp: , Rfl:  .  pantoprazole (PROTONIX) 40 MG tablet, Take 40 mg by mouth daily. (Patient not taking: Reported on 11/02/2019), Disp: , Rfl:  .  Probiotic Product (PROBIOTIC PO), Take 1 Dose by mouth daily., Disp: , Rfl:  .  Thiamine HCl (VITAMIN B-1 PO), Take 500 mg by mouth daily., Disp: , Rfl:   PAST MEDICAL HISTORY: Past Medical History:  Diagnosis Date  . Anxiety   . Hypersomnia   . Rhinitis     PAST SURGICAL HISTORY: Past Surgical History:  Procedure Laterality Date  . TOE SURGERY    . WISDOM TOOTH EXTRACTION      FAMILY HISTORY: Family  History  Problem Relation Age of Onset  . Arthritis Other   . Depression Mother     SOCIAL HISTORY:  Social History   Socioeconomic History  . Marital status: Married    Spouse name: Not on file  . Number of children: Not on file  . Years of education: Not on file  . Highest education level: Not on file  Occupational History  . Not on file  Tobacco Use  . Smoking status: Never Smoker  . Smokeless tobacco: Never Used  Substance and Sexual Activity  . Alcohol use: Yes    Comment: daily    . Drug use: No  . Sexual activity: Not on file  Other Topics Concern  . Not on file  Social History Narrative   Right handed   2 cokes per day   Social Determinants of Health   Financial Resource Strain: Not on file  Food Insecurity: Not on file  Transportation Needs: Not on file  Physical Activity: Not on file  Stress: Not on file  Social Connections: Not on file  Intimate Partner Violence: Not on file     PHYSICAL EXAM  There were no vitals filed for this visit.  There is no height or weight on file to calculate BMI.   General: The patient is well-developed and well-nourished and in no acute distress  HEENT:  Head is [place]/AT.  Sclera are anicteric.  Funduscopic exam shows normal optic discs and retinal vessels.  Neck: No carotid bruits are noted.  The neck is nontender.  Cardiovascular: The heart has a regular rate and rhythm with a normal S1 and S2. There were no murmurs, gallops or rubs.    Skin: Extremities are without rash or  edema.  Musculoskeletal:  Back is nontender  Neurologic Exam  Mental status: The patient is alert and oriented x 3 at the time of the examination. The patient has apparent normal recent and remote memory, with an apparently normal attention span and concentration ability.   Speech is normal.  Cranial nerves: Extraocular movements are full. Pupils are equal, round, and reactive to light and accomodation.  Visual fields are full.  Facial symmetry is present. There is good facial sensation to soft touch bilaterally.Facial strength is normal.  Trapezius and sternocleidomastoid strength is normal. No dysarthria is noted.  The tongue is midline, and the patient has symmetric elevation of the soft palate. No obvious hearing deficits are noted.  Motor:  Muscle bulk is normal.   Tone is normal. Strength is  5 / 5 in all 4 extremities except 4+/5 in the intrinsic foot muscles and EHL muscles.   Sensory: Vibration sensation was reduced to 80% at the  knees and fingertips, 30% at the ankles and 10% at the toes.  Pinprick sensation was reduced below mid shin.  Coordination: Cerebellar testing reveals good finger-nose-finger and heel-to-shin bilaterally.  Gait and station: Station is normal.   Gait is normal. Tandem gait is mildly wide.  Romberg is borderline..   Reflexes: Deep tendon reflexes are symmetric and normal in the arms but absent in the legs.   Plantar responses are flexor.      ASSESSMENT AND PLAN  Polyneuropathy - Plan: Multiple Myeloma Panel (SPEP&IFE w/QIG), MAG IgM Antibodies  High total serum IgM - Plan: Multiple Myeloma Panel (SPEP&IFE w/QIG), MAG IgM Antibodies  Numbness - Plan: Multiple Myeloma Panel (SPEP&IFE w/QIG), MAG IgM Antibodies  1.  To help with the pain the gabapentin will be increased to 600 mg  3 times daily.  Consider a tricyclic if pain persists. 2.   Check SPEP/IEF and anti-MAG IgM 3.   Based on results may need to order a lumbar puncture to determine if protein is elevated.  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   Return to see [place] in 2 months or sooner if there are new or worsening neurologic symptoms or based on the results of the tests  Hermilo Dutter A. Felecia Shelling, MD, [facility] 01/29/5882, 2:54 PM Certified in Neurology, Clinical Neurophysiology, Sleep Medicine and Neuroimaging  [facility] [address], [place], [place] [address] [phone]

## 2020-06-27 ENCOUNTER — Other Ambulatory Visit: Payer: Self-pay | Admitting: Neurology

## 2020-06-28 LAB — MULTIPLE MYELOMA PANEL, SERUM
Albumin SerPl Elph-Mcnc: 4 g/dL (ref 2.9–4.4)
Albumin/Glob SerPl: 1.3 (ref 0.7–1.7)
Alpha 1: 0.3 g/dL (ref 0.0–0.4)
Alpha2 Glob SerPl Elph-Mcnc: 0.9 g/dL (ref 0.4–1.0)
B-Globulin SerPl Elph-Mcnc: 1 g/dL (ref 0.7–1.3)
Gamma Glob SerPl Elph-Mcnc: 1 g/dL (ref 0.4–1.8)
Globulin, Total: 3.2 g/dL (ref 2.2–3.9)
IgA/Immunoglobulin A, Serum: 209 mg/dL (ref 90–386)
IgG (Immunoglobin G), Serum: 861 mg/dL (ref 603–1613)
IgM (Immunoglobulin M), Srm: 210 mg/dL — ABNORMAL HIGH (ref 20–172)
Total Protein: 7.2 g/dL (ref 6.0–8.5)

## 2020-06-28 LAB — MAG INTERPRETATION REFLEXED

## 2020-06-28 LAB — MAG IGM ANTIBODIES: MAG IgM Antibodies: <900 BTU (ref 0–999)

## 2020-07-01 NOTE — Telephone Encounter (Signed)
Placed letter in mail re: shoes for polyneuropathy #99 months #1pair ICD 10: G62.9, R20.0

## 2020-07-29 ENCOUNTER — Other Ambulatory Visit: Payer: Self-pay | Admitting: *Deleted

## 2020-07-29 DIAGNOSIS — R768 Other specified abnormal immunological findings in serum: Secondary | ICD-10-CM

## 2020-07-29 DIAGNOSIS — G629 Polyneuropathy, unspecified: Secondary | ICD-10-CM

## 2020-07-29 DIAGNOSIS — R200 Anesthesia of skin: Secondary | ICD-10-CM

## 2020-08-16 ENCOUNTER — Telehealth: Payer: Self-pay | Admitting: Neurology

## 2020-08-16 NOTE — Telephone Encounter (Signed)
Pt.'s mom Gavin Pound states that Olegario Messier from [facility] states they cannot do a lumbar puncture until pt. has a brain scan done. Mom is suggesting that pt. has a CT scan & is requesting that doctor does an order for a brain scan. Please advise.

## 2020-08-19 ENCOUNTER — Other Ambulatory Visit: Payer: Self-pay | Admitting: Neurology

## 2020-08-19 DIAGNOSIS — R200 Anesthesia of skin: Secondary | ICD-10-CM

## 2020-08-19 NOTE — Telephone Encounter (Signed)
Patient mom called me back and she was not able to find the member ID number she said it was medicaid healthy blue. She stated she would give me a call back once she can find the member ID number I gave her my direct number to call back too and she can leave me a voicemail of the information if I don't get to pick up her call.

## 2020-08-19 NOTE — Telephone Encounter (Signed)
Please let them know I placed an order for a CT

## 2020-08-19 NOTE — Telephone Encounter (Signed)
Tried calling Gavin Pound (mother) back. LVM for her to call. Please let her know CT ordered by MD. She will need to speak with Vickie Epley when she calls to go over order/insurance.

## 2020-08-20 NOTE — Telephone Encounter (Signed)
I called the Gavin Pound the mother and got the insurance information. It is [employer] member ID [id number]. I started the case on the portal it is pending.

## 2020-08-21 NOTE — Telephone Encounter (Signed)
[employer] Berkley Harvey: [id number] (exp. 08/20/20 to 09/20/20) patient is scheduled at [facility] for 08/29/20.

## 2020-08-29 ENCOUNTER — Ambulatory Visit
Admission: RE | Admit: 2020-08-29 | Discharge: 2020-08-29 | Disposition: A | Discharge status: Home / Self Care | Payer: Medicaid Other | Source: Ambulatory Visit | Attending: Neurology | Admitting: Neurology

## 2020-08-29 ENCOUNTER — Other Ambulatory Visit: Payer: Self-pay

## 2020-08-29 DIAGNOSIS — R200 Anesthesia of skin: Secondary | ICD-10-CM

## 2020-09-02 ENCOUNTER — Telehealth: Payer: Self-pay | Admitting: *Deleted

## 2020-09-02 MED ORDER — AZITHROMYCIN 250 MG PO TABS: ORAL_TABLET | ORAL | 0 refills | Status: DC

## 2020-09-02 NOTE — Telephone Encounter (Signed)
Took call from phone staff and spoke with mother. She spoke with son and he would like z-pack for sinusitis called into [facility]. I e-scribed. She spoke with [facility] today and they are working on getting LP authorized via insurance prior to scheduling.

## 2020-09-02 NOTE — Telephone Encounter (Signed)
LVM for mother letting her know results of CT head. Advised her to call [facility] at [phone] to get LP scheduled.  Also asked them to call back if he would like z-pack called in if he is having sinusitis symptoms.

## 2020-09-02 NOTE — Addendum Note (Signed)
Addended by: Arther Abbott on: 09/02/2020 04:52 PM   Modules accepted: Orders

## 2020-09-02 NOTE — Telephone Encounter (Signed)
-----   Message from Asa Lente, MD sent at 08/29/2020  9:33 PM EDT ----- The CT scan of the brain was normal.  Therefore he can proceed with a lumbar puncture.  The CT scan did show sinusitis on the left.  If he has any sinusitis symptoms we can call in a Z-Pak

## 2020-09-10 ENCOUNTER — Inpatient Hospital Stay
Admission: RE | Admit: 2020-09-10 | Discharge: 2020-09-10 | Disposition: A | Discharge status: Home / Self Care | Payer: Medicaid Other | Source: Ambulatory Visit | Attending: Neurology | Admitting: Neurology

## 2020-09-10 NOTE — Discharge Instructions (Signed)

## 2020-09-16 DIAGNOSIS — Z0271 Encounter for disability determination: Secondary | ICD-10-CM

## 2020-09-18 ENCOUNTER — Ambulatory Visit
Admission: RE | Admit: 2020-09-18 | Discharge: 2020-09-18 | Disposition: A | Discharge status: Home / Self Care | Payer: Medicaid Other | Source: Ambulatory Visit | Attending: Neurology | Admitting: Neurology

## 2020-09-18 ENCOUNTER — Other Ambulatory Visit: Payer: Self-pay

## 2020-09-18 VITALS — BP 137/91 | HR 89

## 2020-09-18 DIAGNOSIS — R200 Anesthesia of skin: Secondary | ICD-10-CM

## 2020-09-18 DIAGNOSIS — G629 Polyneuropathy, unspecified: Secondary | ICD-10-CM

## 2020-09-18 DIAGNOSIS — R768 Other specified abnormal immunological findings in serum: Secondary | ICD-10-CM

## 2020-09-18 NOTE — Discharge Instructions (Signed)
Lumbar Puncture Discharge Instructions  1. Go home and rest quietly as needed. You may resume normal activities; however, do not exert yourself strongly or do any heavy lifting today and tomorrow.   2. DO NOT drive today.    3. You may resume your normal diet and medications unless otherwise indicated. Drink lots of extra fluids today and tomorrow.   4. The incidence of headache, nausea, or vomiting is about 5% (one in 20 patients).  If you develop a headache, lie flat for 24 hours and drink plenty of fluids until the headache goes away.  Caffeinated beverages may be helpful. If when you get up you still have a headache when standing, go back to bed and force fluids for another 24 hours.   5. If you develop severe nausea and vomiting or a headache that does not go away with the flat bedrest after 48 hours, please call [phone].   6. Call your physician for a follow-up appointment.  The results of your Lumbar Puncture will be sent directly to your physician and they will contact you.   7. If you have any questions or if complications develop after you arrive home, please call [phone].  Discharge instructions have been explained to the patient.  The patient, or the person responsible for the patient, fully understands these instructions.   Thank you for visiting our office today.  

## 2020-09-22 LAB — CSF CELL COUNT WITH DIFFERENTIAL
RBC Count, CSF: 2 cells/uL — ABNORMAL HIGH
WBC, CSF: 1 cells/uL (ref 0–5)

## 2020-09-22 LAB — PROTEIN, CSF: Total Protein, CSF: 41 mg/dL (ref 15–45)

## 2020-09-22 LAB — GLUCOSE, CSF: Glucose, CSF: 63 mg/dL (ref 40–80)

## 2020-09-22 LAB — VDRL, CSF: VDRL Quant, CSF: NONREACTIVE

## 2020-10-07 ENCOUNTER — Other Ambulatory Visit: Payer: Self-pay | Admitting: Neurology

## 2020-10-07 MED ORDER — PREDNISONE 20 MG PO TABS: ORAL_TABLET | ORAL | 5 refills | Status: DC

## 2020-10-07 NOTE — Progress Notes (Signed)
x

## 2020-10-07 NOTE — Telephone Encounter (Signed)
Called and spoke w/ mother about results per Dr. Bonnita Hollow note. She verbalized understanding. Scheduled follow up for 11/04/20 at 9am.

## 2020-11-04 ENCOUNTER — Ambulatory Visit: Payer: Medicaid Other | Admitting: Neurology

## 2020-11-04 ENCOUNTER — Telehealth: Payer: Self-pay | Admitting: *Deleted

## 2020-11-04 ENCOUNTER — Other Ambulatory Visit: Payer: Self-pay | Admitting: *Deleted

## 2020-11-04 ENCOUNTER — Encounter: Payer: Self-pay | Admitting: Neurology

## 2020-11-04 VITALS — BP 140/95 | HR 119 | Ht 69.0 in | Wt 171.0 lb

## 2020-11-04 DIAGNOSIS — R200 Anesthesia of skin: Secondary | ICD-10-CM

## 2020-11-04 DIAGNOSIS — M5441 Lumbago with sciatica, right side: Secondary | ICD-10-CM

## 2020-11-04 DIAGNOSIS — R768 Other specified abnormal immunological findings in serum: Secondary | ICD-10-CM

## 2020-11-04 DIAGNOSIS — R29898 Other symptoms and signs involving the musculoskeletal system: Secondary | ICD-10-CM

## 2020-11-04 DIAGNOSIS — M5442 Lumbago with sciatica, left side: Secondary | ICD-10-CM

## 2020-11-04 DIAGNOSIS — G629 Polyneuropathy, unspecified: Secondary | ICD-10-CM

## 2020-11-04 MED ORDER — TRAMADOL HCL 50 MG PO TABS: 50.0000 mg | ORAL_TABLET | Freq: Three times a day (TID) | ORAL | 3 refills | Status: DC | PRN

## 2020-11-04 MED ORDER — TRAMADOL HCL 50 MG PO TABS: 50.0000 mg | ORAL_TABLET | Freq: Four times a day (QID) | ORAL | 3 refills | Status: DC | PRN

## 2020-11-04 MED ORDER — PREDNISONE 5 MG PO TABS: ORAL_TABLET | ORAL | 1 refills | Status: DC

## 2020-11-04 MED ORDER — SILDENAFIL CITRATE 50 MG PO TABS: 50.0000 mg | ORAL_TABLET | Freq: Every day | ORAL | 3 refills | Status: DC | PRN

## 2020-11-04 NOTE — Progress Notes (Signed)
[facility]  PATIENT: Tommy Ellison DOB: 07/23/1975  REFERRING DOCTOR OR PCP: Henrine Screws, MD SOURCE: Patient, notes from primary care  _________________________________   HISTORICAL  CHIEF COMPLAINT:  Chief Complaint  Patient presents with   Follow-up    RM 13, alone. Last seen 06/25/20. EMG abnormal, placed on steroids for the last month. He does not feel the steroids have helped w/ inflammation/swelling. Has just increased his appetite. 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      Update 11/04/2020 He feels the numbness, pain and balance are doing worse.   Pain in the lower back and feet is the worst.   The back pain has a more aching quality while the foot pain is dysesthetic with a painful cold sensation as well as pins-and-needles.  We discussed the time course of his symptoms in more detail.  He had generally been in good health until 2020.  He became deaf over a few days in 2020.   He has had ED since 2020 as well.  Dysesthesias and numbness began around September or October 2020.  Balance worsened around that time as well and he had a fall at that time.  He feels symptoms have progressively worsened since the onset.   He notes mild weakness in his legs but not in his hands.      He notes no bladder changes.  He has a history of moderate alcohol abuse but drinks much less over the past couple of years.  CSF did not show elevated protein or any other abnormality.   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    Since the last visit, we have started prednisone 60 mg.  However, that has not helped.  We discussed a tapering schedule since it has not helped and possible consideration of IVIG.  Gabapentin helps  slightly and current dose is 600 mg 3 times daily.  Labs 11/02/2019.  SPEP/IEF showed a polyclonal increase in IgM.  SSA/SSB, cryoglobulins and thiamine were normal.   Anti-mag IgM was negative.  Hepatitis labs were negative.      I reviewed lab work from primary care.  B12 and TSH were normal.  He has no family history of polyneuropathy that he is aware of.  REVIEW OF SYSTEMS: Constitutional: No fevers, chills, sweats, or change in appetite Eyes: No visual changes, double vision, eye pain Ear, nose and throat: No hearing loss, ear pain, nasal congestion, sore throat Cardiovascular: No chest pain, palpitations Respiratory:  No shortness of breath at rest or with exertion.   No wheezes GastrointestinaI: No nausea, vomiting, diarrhea, abdominal pain, fecal incontinence Genitourinary:  No dysuria, urinary retention or frequency.  No nocturia. Musculoskeletal:  No neck pain, back pain Integumentary: No rash, pruritus, skin lesions Neurological: as above Psychiatric: No depression at this time.  No anxiety Endocrine: No palpitations, diaphoresis, change in appetite, change in weigh or increased thirst Hematologic/Lymphatic:  No anemia, purpura, petechiae. Allergic/Immunologic: No itchy/runny eyes, nasal congestion, recent allergic reactions, rashes  ALLERGIES: No Known Allergies  HOME MEDICATIONS:  Current Outpatient Medications:    acetaminophen (TYLENOL) 500 MG tablet, Take 500 mg by mouth every 6 (six) hours as needed., Disp: , Rfl:    Cholecalciferol (VITAMIN D3 PO),  Take 5,000 Units by mouth daily., Disp: , Rfl:    citalopram (CELEXA) 20 MG tablet, Take 1 tablet (20 mg total) by mouth daily., Disp: 90 tablet, Rfl: 3   Cyanocobalamin (VITAMIN B-12 PO), Take 1,000 mcg by mouth daily., Disp: , Rfl:    Ferrous Sulfate (IRON PO), Take 65 mg by mouth 2 (two) times daily., Disp: , Rfl:    ferrous sulfate 325 (65 FE) MG tablet, Take 325 mg by mouth 2 (two) times daily with a meal., Disp: ,  Rfl:    FOLIC ACID PO, Take 800 mcg by mouth daily., Disp: , Rfl:    gabapentin (NEURONTIN) 600 MG tablet, Take 1 tablet (600 mg total) by mouth 3 (three) times daily., Disp: 90 tablet, Rfl: 5   Multiple Vitamin (MULTIVITAMIN) tablet, Take 1 tablet by mouth daily., Disp: , Rfl:    pantoprazole (PROTONIX) 40 MG tablet, Take 40 mg by mouth daily., Disp: , Rfl:    predniSONE (DELTASONE) 20 MG tablet, Take 3 po qd, or as directed by your physician, Disp: 90 tablet, Rfl: 5   predniSONE (DELTASONE) 5 MG tablet, Take as directed, Disp: 100 tablet, Rfl: 1   sildenafil (VIAGRA) 50 MG tablet, Take 1 tablet (50 mg total) by mouth daily as needed for erectile dysfunction. Up to 10 pills/month, Disp: 10 tablet, Rfl: 3   Thiamine HCl (VITAMIN B-1 PO), Take 500 mg by mouth daily., Disp: , Rfl:    traMADol (ULTRAM) 50 MG tablet, Take 1 tablet (50 mg total) by mouth every 8 (eight) hours as needed., Disp: 90 tablet, Rfl: 3  PAST MEDICAL HISTORY: Past Medical History:  Diagnosis Date   Anxiety    Hypersomnia    Rhinitis     PAST SURGICAL HISTORY: Past Surgical History:  Procedure Laterality Date   TOE SURGERY     WISDOM TOOTH EXTRACTION      FAMILY HISTORY: Family History  Problem Relation Age of Onset   Arthritis Other    Depression Mother     SOCIAL HISTORY:  Social History   Socioeconomic History   Marital status: Divorced    Spouse name: Not on file   Number of children: Not on file   Years of education: Not on file   Highest education level: Not on file  Occupational History   Not on file  Tobacco Use   Smoking status: Never   Smokeless tobacco: Never  Substance and Sexual Activity   Alcohol use: Yes    Comment: daily    Drug use: No   Sexual activity: Not on file  Other Topics Concern   Not on file  Social History Narrative   Right handed   2 cokes per day   Social Determinants of Health   Financial Resource Strain: Not on file  Food Insecurity: Not on file   Transportation Needs: Not on file  Physical Activity: Not on file  Stress: Not on file  Social Connections: Not on file  Intimate Partner Violence: Not on file     PHYSICAL EXAM  Vitals:   11/04/20 0836  BP: (!) 140/95  Pulse: (!) 119  Weight: 171 lb (77.6 kg)  Height: 5\' 9"  (1.753 m)    Body mass index is 25.25 kg/m.   General: The patient is well-developed and well-nourished and in no acute distress  HEENT:  Head is [place]/AT.  Sclera are anicteric.   Neck/musculoskeletal: The neck is nontender with good range of motion.  He has some tenderness over  the lumbar spine and paraspinal muscles  Skin: Extremities are without rash or  edema.  Neurologic Exam  Mental status: The patient is alert and oriented x 3 at the time of the examination. The patient has apparent normal recent and remote memory, with an apparently normal attention span and concentration ability.   Speech is normal.  Cranial nerves: Extraocular movements are full. Pupils are small and not definitely reactive.  Visual fields are full.  Facial symmetry is present. There is good facial sensation to soft touch bilaterally.Facial strength is normal.  Trapezius and sternocleidomastoid strength is normal. No dysarthria is noted.   No obvious hearing deficits are noted.  Motor:  Muscle bulk is normal.   Tone is normal. Strength is  5 / 5 in all 4 extremities except 4+/5 in the intrinsic foot muscles and EHL muscles.   Sensory: He has mildly reduced vibration sensation (75%) in the knees and fingertips relative to the proximal arms.  He has only 20 to 25% sensation at the ankles and negligible vibration sensation at the toes.  Pinprick sensation was reduced below mid shin.  Coordination: Cerebellar testing reveals good finger-nose-finger and heel-to-shin bilaterally.  Gait and station: Station is normal.   Gait is slightly wide. Tandem gait is moderately wide.  Romberg is borderline..   Reflexes: Deep tendon reflexes  are symmetric and normal in the arms but absent in the legs.   .      ASSESSMENT AND PLAN  Leg weakness, bilateral - Plan: MR LUMBAR SPINE WO CONTRAST  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, unspecified chronicity - Plan: MR LUMBAR SPINE WO CONTRAST  Polyneuropathy  High total serum IgM  Numbness  1.  The etiology of his polyneuropathy is not clear.  He does have a polyclonal IgM elevation but no paraprotein.  CSF did not show elevated protein.  I discussed with him that because he did have the onset of other symptoms including deafness at about the same time the polyneuropathy started we need to also consider a hereditary sensorimotor polyneuropathy and we will check an [place] panel for hereditary sensorimotor polyneuropathy (CMT variants).    2.   Taper prednisone and consider a trial of IVIg.  If he continues to progress and we do not find an answer with the blood work and he gets no benefit from IVIG , I would like to get a second opinion 3.    To help with the pain continue gabapentin and add tramadol.  As back pain is much worse, we will check MRI of the lumbar spine.   4.   Return to see [place] in 2 months or sooner if there are new or worsening neurologic symptoms or based on the results of the tests  46-minute office visit with the majority of the time spent face-to-face for history and physical, discussion/counseling and decision-making.  Additional time with record review and documentation.   Salvatore Poe A. Epimenio Foot, MD, [facility] 11/04/2020, 6:17 PM Certified in Neurology, Clinical Neurophysiology, Sleep Medicine and Neuroimaging  [facility] [address], [address] [place], [place] [address] [phone]

## 2020-11-04 NOTE — Telephone Encounter (Signed)
Faxed completed/signed [facility] lab order: [id number] (CMT Advanced Eval Demyelinating) to [phone]. Received fax confirmation.

## 2020-11-05 ENCOUNTER — Telehealth: Payer: Self-pay | Admitting: *Deleted

## 2020-11-05 NOTE — Telephone Encounter (Signed)
[facility], spoke w/ [place]. Confirmed they received order. Needed ICD10 code: I provided: G62.9. They are taking longer to process orders d/t staffing shortages. Unable to give me time frame. They will see if insurance will cover first. Nothing further needed at this time.

## 2020-11-05 NOTE — Telephone Encounter (Signed)
PA approved 11/05/2020-05/04/21 #90.  Faxed notice of approval to [facility] at [phone]. Received fax confirmation.

## 2020-11-05 NOTE — Telephone Encounter (Signed)
Submitted PA tramadol on CMM. Key: [id number]. PA Case ID: [id number] - Rx #: [id number]. Waiting on determination from [facility].

## 2020-11-19 NOTE — Telephone Encounter (Signed)
Called [place] [place] and spoke w/ Anda Kraft. She transferred me. Spoke w/ Evette Doffing. Have not sent kit out yet to pt. He will verify insurance information and then will send kit out today to pt. Provided mother's name/phone # as alternative contact if needed.

## 2020-11-20 ENCOUNTER — Telehealth: Payer: Self-pay | Admitting: Neurology

## 2020-11-20 NOTE — Telephone Encounter (Signed)
MRI lumbar spine wo contrast (50932) is pending w/ [facility] [place] [employer]. Pending #[id number]. Will send to [facility] once approved.

## 2020-11-26 ENCOUNTER — Other Ambulatory Visit: Payer: Self-pay | Admitting: Neurology

## 2020-11-26 NOTE — Telephone Encounter (Signed)
Called Tommy Ellison and spoke w/ Maggie. Still pending prepayment. They sent out financial paperwork but they have not heard back from the patient. I asked they also e-mail him what is needed at [email or web address]. They will do this.

## 2020-11-26 NOTE — Telephone Encounter (Signed)
Received approval from [facility].  #[id number] (11/20/20- 01/19/21). Sent to GI.

## 2020-11-27 ENCOUNTER — Other Ambulatory Visit: Payer: Self-pay

## 2020-11-27 ENCOUNTER — Ambulatory Visit
Admission: RE | Admit: 2020-11-27 | Discharge: 2020-11-27 | Disposition: A | Discharge status: Home / Self Care | Payer: Medicaid Other | Source: Ambulatory Visit | Attending: Neurology | Admitting: Neurology

## 2020-11-27 DIAGNOSIS — M5441 Lumbago with sciatica, right side: Secondary | ICD-10-CM

## 2020-11-27 DIAGNOSIS — R29898 Other symptoms and signs involving the musculoskeletal system: Secondary | ICD-10-CM

## 2020-11-27 DIAGNOSIS — M5442 Lumbago with sciatica, left side: Secondary | ICD-10-CM

## 2020-12-03 ENCOUNTER — Other Ambulatory Visit: Payer: Self-pay | Admitting: *Deleted

## 2020-12-03 MED ORDER — ETODOLAC 400 MG PO TABS: 400.0000 mg | ORAL_TABLET | Freq: Two times a day (BID) | ORAL | 5 refills | Status: DC

## 2020-12-03 NOTE — Telephone Encounter (Signed)
Called [facility] to get update on pt. Spoke w/ Angie. Still pending. No turn around time. No sample received. Verified insurance. No financial paperwork received from patient yet.

## 2020-12-23 ENCOUNTER — Other Ambulatory Visit: Payer: Self-pay | Admitting: Neurology

## 2020-12-24 ENCOUNTER — Ambulatory Visit: Payer: Medicaid Other | Admitting: Neurology

## 2020-12-24 ENCOUNTER — Other Ambulatory Visit: Payer: Self-pay

## 2020-12-24 ENCOUNTER — Encounter: Payer: Self-pay | Admitting: Neurology

## 2020-12-24 VITALS — BP 124/88 | HR 112 | Ht 69.0 in | Wt 161.0 lb

## 2020-12-24 DIAGNOSIS — R768 Other specified abnormal immunological findings in serum: Secondary | ICD-10-CM | POA: Diagnosis not present

## 2020-12-24 DIAGNOSIS — G629 Polyneuropathy, unspecified: Secondary | ICD-10-CM

## 2020-12-24 DIAGNOSIS — R200 Anesthesia of skin: Secondary | ICD-10-CM

## 2020-12-24 DIAGNOSIS — G6181 Chronic inflammatory demyelinating polyneuritis: Secondary | ICD-10-CM

## 2020-12-24 DIAGNOSIS — Z79899 Other long term (current) drug therapy: Secondary | ICD-10-CM

## 2020-12-24 DIAGNOSIS — M5441 Lumbago with sciatica, right side: Secondary | ICD-10-CM

## 2020-12-24 DIAGNOSIS — M5442 Lumbago with sciatica, left side: Secondary | ICD-10-CM

## 2020-12-24 NOTE — Progress Notes (Signed)
[facility]  PATIENT: Tommy Ellison DOB: 05-Oct-1975  REFERRING DOCTOR OR PCP: Tommy Screws, MD SOURCE: Patient, notes from primary care  _________________________________   HISTORICAL  CHIEF COMPLAINT:  Chief Complaint  Patient presents with   Follow-up    Rm 2, with mother Tommy Ellison. Here for 6 week f/u, pt reports worsening in pain and numbness.     HISTORY OF PRESENT ILLNESS:  Tommy Ellison is a 45 year old man with deafness and polyneuropathy since 2020.      Update 11/04/2020 He feels the numbness, pain and balance are worsening   He has back pain with an aching quality.  He also has dysesthetic pain in the feet with both a cold sensation and pins and needle sensation.     Prednisone had not helped and we stopped it.   He has lost the extra weight he gained.     He has not done the [facility] CMT panel yet and he will call them back.      He will be getting a cochlear implant soon.  P.o. discussed about his symptoms.  His mother accompanied him today and I reviewed the studies with her as well.  History of neuropathy:  He had generally been in good health until 2020.  He became deaf over a few days in July 2020. However hearing had declined some in preceding month (mildly muffled).   He also became more ubbalanced that month and fell 12/03/2020.     He has had ED since 2020 as well.  Dysesthesias and numbness began around September or October 2020.  Balance worsened around that time as well and he had a fall at that time.  He feels symptoms have progressively worsened since the onset.   He notes mild weakness in his legs but not in his hands.      He notes no bladder changes.  He has a history of moderate alcohol abuse but drinks much less over the past couple of years.  CSF did not show elevated protein or any other abnormality.   This NCV/EMG study shows electrophysiologic evidence of a length dependent motor and sensory demyelinating more than axonal  polyneuropathy - mostly involvement of legs.    Gabapentin helps slightly and current dose is 600 mg 3 times daily.  Labs 11/02/2019.  SPEP/IEF showed a polyclonal increase in IgM.  SSA/SSB, cryoglobulins and thiamine were normal.   Anti-mag IgM was negative.  Hepatitis labs were negative.      I reviewed lab work from primary care.  B12 and TSH were normal.  He has no family history of polyneuropathy that he is aware of.  REVIEW OF SYSTEMS: Constitutional: No fevers, chills, sweats, or change in appetite Eyes: No visual changes, double vision, eye pain Ear, nose and throat: No hearing loss, ear pain, nasal congestion, sore throat Cardiovascular: No chest pain, palpitations Respiratory:  No shortness of breath at rest or with exertion.   No wheezes GastrointestinaI: No nausea, vomiting, diarrhea, abdominal pain, fecal incontinence Genitourinary:  No dysuria, urinary retention or frequency.  No nocturia. Musculoskeletal:  No neck pain, back pain Integumentary: No rash, pruritus, skin lesions Neurological: as above Psychiatric: No depression at this time.  No anxiety Endocrine: No palpitations, diaphoresis, change in appetite, change in weigh or increased thirst Hematologic/Lymphatic:  No anemia, purpura, petechiae. Allergic/Immunologic: No itchy/runny eyes, nasal congestion, recent allergic reactions, rashes  ALLERGIES: No Known Allergies  HOME MEDICATIONS:  Current Outpatient Medications:  acetaminophen (TYLENOL) 500 MG tablet, Take 500 mg by mouth every 6 (six) hours as needed., Disp: , Rfl:    Cholecalciferol (VITAMIN D3 PO), Take 5,000 Units by mouth daily., Disp: , Rfl:    citalopram (CELEXA) 20 MG tablet, Take 1 tablet (20 mg total) by mouth daily., Disp: 90 tablet, Rfl: 3   Cyanocobalamin (VITAMIN B-12 PO), Take 1,000 mcg by mouth daily., Disp: , Rfl:    etodolac (LODINE) 400 MG tablet, Take 1 tablet (400 mg total) by mouth 2 (two) times daily., Disp: 60 tablet, Rfl: 5    Ferrous Sulfate (IRON PO), Take 65 mg by mouth 2 (two) times daily., Disp: , Rfl:    ferrous sulfate 325 (65 FE) MG tablet, Take 325 mg by mouth 2 (two) times daily with a meal., Disp: , Rfl:    FOLIC ACID PO, Take 800 mcg by mouth daily., Disp: , Rfl:    Multiple Vitamin (MULTIVITAMIN) tablet, Take 1 tablet by mouth daily., Disp: , Rfl:    pantoprazole (PROTONIX) 40 MG tablet, Take 40 mg by mouth daily., Disp: , Rfl:    predniSONE (DELTASONE) 5 MG tablet, Take as directed, Disp: 100 tablet, Rfl: 1   sildenafil (VIAGRA) 50 MG tablet, Take 1 tablet (50 mg total) by mouth daily as needed for erectile dysfunction. Up to 10 pills/month, Disp: 10 tablet, Rfl: 3   Thiamine HCl (VITAMIN B-1 PO), Take 500 mg by mouth daily., Disp: , Rfl:    traMADol (ULTRAM) 50 MG tablet, Take 1 tablet (50 mg total) by mouth every 8 (eight) hours as needed., Disp: 90 tablet, Rfl: 3   gabapentin (NEURONTIN) 600 MG tablet, TAKE 1 TABLET BY MOUTH THREE TIMES A DAY, Disp: 90 tablet, Rfl: 5  PAST MEDICAL HISTORY: Past Medical History:  Diagnosis Date   Anxiety    Hypersomnia    Rhinitis     PAST SURGICAL HISTORY: Past Surgical History:  Procedure Laterality Date   TOE SURGERY     WISDOM TOOTH EXTRACTION      FAMILY HISTORY: Family History  Problem Relation Age of Onset   Arthritis Other    Depression Mother     SOCIAL HISTORY:  Social History   Socioeconomic History   Marital status: Divorced    Spouse name: Not on file   Number of children: Not on file   Years of education: Not on file   Highest education level: Not on file  Occupational History   Not on file  Tobacco Use   Smoking status: Never   Smokeless tobacco: Never  Substance and Sexual Activity   Alcohol use: Yes    Comment: daily    Drug use: No   Sexual activity: Not on file  Other Topics Concern   Not on file  Social History Narrative   Right handed   2 cokes per day   Social Determinants of Health   Financial Resource  Strain: Not on file  Food Insecurity: Not on file  Transportation Needs: Not on file  Physical Activity: Not on file  Stress: Not on file  Social Connections: Not on file  Intimate Partner Violence: Not on file     PHYSICAL EXAM  Vitals:   12/24/20 0957  BP: 124/88  Pulse: (!) 112  Weight: 161 lb (73 kg)  Height: 5\' 9"  (1.753 m)    Body mass index is 23.78 kg/m.   General: The patient is well-developed and well-nourished and in no acute distress  HEENT:  Head  is [place]/AT.  Sclera are anicteric.   Neck/musculoskeletal: The neck is nontender with good range of motion.  He has some tenderness over the lumbar spine and paraspinal muscles  Skin: Extremities are without rash or  edema.  Neurologic Exam  Mental status: The patient is alert and oriented x 3 at the time of the examination. The patient has apparent normal recent and remote memory, with an apparently normal attention span and concentration ability.   Speech is normal  Cranial nerves: Extraocular movements are full. Pupils are small and not definitely reactive.  Visual fields are full.  Facial symmetry is present. There is good facial sensation to soft touch bilaterally.Facial strength is normal.  Trapezius and sternocleidomastoid strength is normal. No dysarthria is noted.   He is deaf  Motor:  Muscle bulk is normal.   Tone is normal. Strength is  5 / 5 in all 4 extremities except 4+/5 in the intrinsic foot muscles and EHL muscles.   Sensory: He has mildly reduced vibration sensation (75%) in the knees and fingertips relative to the proximal arms.  He has only 20 to 25% sensation at the ankles and negligible vibration sensation at the toes.  Pinprick sensation was reduced below mid shin.  Coordination: Cerebellar testing reveals good finger-nose-finger and heel-to-shin bilaterally.  Gait and station: Station is normal.   Gait is wide.. Tandem gait is moderately wide.  Romberg is borderline..   Reflexes: Deep tendon  reflexes are symmetric and normal in the arms but absent in the legs.   .      ASSESSMENT AND PLAN  Polyneuropathy - Plan: ANCA Profile, Rheumatoid factor  High total serum IgM - Plan: Comprehensive metabolic panel, ANCA Profile  CIDP (chronic inflammatory demyelinating polyneuropathy) (HCC) - Plan: Comprehensive metabolic panel, ANCA Profile  High risk medication use - Plan: Comprehensive metabolic panel  Numbness  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, unspecified chronicity  1.  He has a polyneuropathy that appeared to progress rapidly in 2020 and has progressed more slowly over the last year.  He does have a polyclonal IgM elevation but no paraprotein.  CSF did not show elevated protein.  He could have CIDP though that this is not common.  Hereditary polyneuropathies can be associated with deafness but the rapid progression in 2020 would be more than usual.  To further evaluate this, we will check an [facility] panel for hereditary sensorimotor polyneuropathy (CMT variants).    2.   Taper prednisone and begin IVIg.  If he continues to progress and we do not find an answer with the blood work and he gets no benefit from IVIG , I would like to get a second opinion 3.    To help with the pain continue gabapentin and add tramadol.  As back pain is much worse, we will check MRI of the lumbar spine.   4.   Return to see [place] in 2 months or sooner if there are new or worsening neurologic symptoms or based on the results of the tests  46-minute office visit with the majority of the time spent face-to-face for history and physical, discussion/counseling and decision-making.  Additional time with record review and documentation.   Michaeleen Down A. Epimenio Foot, MD, [facility] 12/24/2020, 10:15 PM Certified in Neurology, Clinical Neurophysiology, Sleep Medicine and Neuroimaging  [facility] [address], [address] [place], [place] [address] [phone]

## 2020-12-26 ENCOUNTER — Telehealth: Payer: Self-pay | Admitting: *Deleted

## 2020-12-26 NOTE — Telephone Encounter (Signed)
Per Dr. Epimenio Foot: "I am going to start him on IVIG for his CIDP polyneuropathy the dose will be 70 g qd x 2 days every month"  Gave completed/signed orders to [facility] to start processing for pt. Dose: 140g IV over 2 days. Requesting Gamunex-C but per MD, ok to go with brand insurance prefers if not Gamunex-C. Pre med: Tylenol 650mg  po 30 min prior to infusion.

## 2020-12-27 LAB — ANCA PROFILE
Anti-MPO Antibodies: <0.2 units (ref 0.0–0.9)
Anti-PR3 Antibodies: <0.2 units (ref 0.0–0.9)
Atypical pANCA: <1:20 {titer}
C-ANCA: <1:20 {titer}
P-ANCA: <1:20 {titer}

## 2020-12-27 LAB — COMPREHENSIVE METABOLIC PANEL
ALT: 15 IU/L (ref 0–44)
AST: 40 IU/L (ref 0–40)
Albumin/Globulin Ratio: 1.8 (ref 1.2–2.2)
Albumin: 4.4 g/dL (ref 4.0–5.0)
Alkaline Phosphatase: 132 IU/L — ABNORMAL HIGH (ref 44–121)
BUN/Creatinine Ratio: 3 — ABNORMAL LOW (ref 9–20)
BUN: 2 mg/dL — ABNORMAL LOW (ref 6–24)
Bilirubin Total: 1.2 mg/dL (ref 0.0–1.2)
CO2: 19 mmol/L — ABNORMAL LOW (ref 20–29)
Calcium: 9.3 mg/dL (ref 8.7–10.2)
Chloride: 91 mmol/L — ABNORMAL LOW (ref 96–106)
Creatinine, Ser: 0.64 mg/dL — ABNORMAL LOW (ref 0.76–1.27)
Globulin, Total: 2.5 g/dL (ref 1.5–4.5)
Glucose: 102 mg/dL — ABNORMAL HIGH (ref 65–99)
Potassium: 4.1 mmol/L (ref 3.5–5.2)
Sodium: 132 mmol/L — ABNORMAL LOW (ref 134–144)
Total Protein: 6.9 g/dL (ref 6.0–8.5)
eGFR: 119 mL/min/{1.73_m2} (ref >59)

## 2020-12-27 LAB — RHEUMATOID FACTOR: Rheumatoid fact SerPl-aCnc: 15.3 IU/mL — ABNORMAL HIGH (ref <14.0)

## 2021-01-23 DIAGNOSIS — H9190 Unspecified hearing loss, unspecified ear: Secondary | ICD-10-CM | POA: Insufficient documentation

## 2021-03-05 ENCOUNTER — Other Ambulatory Visit: Payer: Self-pay | Admitting: Neurology

## 2021-03-06 HISTORY — PX: COCHLEAR IMPLANT: SUR684

## 2021-04-03 ENCOUNTER — Other Ambulatory Visit: Payer: Self-pay | Admitting: Neurology

## 2021-05-12 ENCOUNTER — Other Ambulatory Visit: Payer: Self-pay | Admitting: Neurology

## 2021-05-13 ENCOUNTER — Telehealth: Payer: Self-pay

## 2021-05-13 NOTE — Telephone Encounter (Signed)
I have submitted a PA request for Tramadol on CMM, Key: [id number] - PA Case ID: [id number]. Awaiting determination from [facility]

## 2021-05-14 NOTE — Telephone Encounter (Signed)
Received fax from [facility], tramadol approved 90 units for 05/14/21 - 11/10/2021. Approval faxed to pharmacy.

## 2021-05-22 ENCOUNTER — Telehealth: Payer: Self-pay | Admitting: *Deleted

## 2021-05-22 NOTE — Telephone Encounter (Signed)
Pt here for infusion today. Asked for parking placard handicap form to be completed. Received verbal ok from D.r Sater. Completed and given to pt to complete his portion and turn into DMV to process.

## 2021-06-10 ENCOUNTER — Other Ambulatory Visit: Payer: Self-pay | Admitting: Neurology

## 2021-06-10 ENCOUNTER — Telehealth: Payer: Self-pay

## 2021-06-10 NOTE — Telephone Encounter (Signed)
I have submitted a PA request for Etodolac on CMM, Key: [id number] - PA Case ID: [id number]. Received instant approval.   PA Case: [id number], Status: Approved, Coverage Starts on: 06/10/2021 12:00:00 AM, Coverage Ends on: 06/10/2022 12:00:00 AM.

## 2021-06-11 ENCOUNTER — Other Ambulatory Visit: Payer: Self-pay | Admitting: Neurology

## 2021-06-15 ENCOUNTER — Other Ambulatory Visit: Payer: Self-pay | Admitting: Psychiatry

## 2021-06-18 ENCOUNTER — Other Ambulatory Visit: Payer: Self-pay | Admitting: Psychiatry

## 2021-06-18 ENCOUNTER — Other Ambulatory Visit: Payer: Self-pay | Admitting: Neurology

## 2021-06-18 MED ORDER — TRAMADOL HCL 50 MG PO TABS: 50.0000 mg | ORAL_TABLET | Freq: Three times a day (TID) | ORAL | 0 refills | Status: DC | PRN

## 2021-06-18 NOTE — Telephone Encounter (Signed)
Pts mother is at the desk stating that pt had issues with blood pressure today while getting infusion. She states that she thinks it is because of pain vs blood pressure. He is due for tramadol refill. Pt needs a 6 mth follow up visit at which point he doesn't have. I tried to look to see how I could have the pt seen but there was nothing available. I went ahead and had the pt get on the schedule for first opening available and advised I will keep him on wait list and see if we can get him sooner. I advised I will forward the refill request to Dr Epimenio Foot for him to review

## 2021-06-19 ENCOUNTER — Other Ambulatory Visit: Payer: Self-pay | Admitting: *Deleted

## 2021-06-19 MED ORDER — TRAMADOL HCL 50 MG PO TABS: 50.0000 mg | ORAL_TABLET | Freq: Three times a day (TID) | ORAL | 0 refills | Status: DC | PRN

## 2021-06-27 ENCOUNTER — Ambulatory Visit
Admission: EM | Admit: 2021-06-27 | Discharge: 2021-06-27 | Disposition: A | Discharge status: Home / Self Care | Payer: Medicaid Other

## 2021-06-27 ENCOUNTER — Other Ambulatory Visit: Payer: Self-pay

## 2021-06-27 DIAGNOSIS — H9193 Unspecified hearing loss, bilateral: Secondary | ICD-10-CM

## 2021-06-27 DIAGNOSIS — M7021 Olecranon bursitis, right elbow: Secondary | ICD-10-CM | POA: Insufficient documentation

## 2021-06-27 DIAGNOSIS — M25421 Effusion, right elbow: Secondary | ICD-10-CM

## 2021-06-27 DIAGNOSIS — G629 Polyneuropathy, unspecified: Secondary | ICD-10-CM

## 2021-06-27 DIAGNOSIS — Z9181 History of falling: Secondary | ICD-10-CM

## 2021-06-27 DIAGNOSIS — M25521 Pain in right elbow: Secondary | ICD-10-CM

## 2021-06-27 NOTE — ED Triage Notes (Addendum)
Pt reports having a fall (5 days ago) on elbow (right). Pt has swelling and redness to right elbow.   Patient is hard of hearing and presents to urgent care with his mother.

## 2021-06-27 NOTE — ED Provider Notes (Signed)
Patient referred to orthopedic walk-in clinic for possible incision and drainage of bursa on right elbow.   Tommy Ellison, [place] 06/27/21 1232

## 2021-06-27 NOTE — ED Notes (Signed)
Patient is being discharged from the Urgent Care and sent to [facility] via POA with mother. Per L. Blanchie Serve, patient is in need of higher level of care due to need for imaging and further evaluation. Patient is aware and verbalizes understanding of plan of care.  Vitals:   06/27/21 1201  BP: 127/89  Pulse: 100  Resp: 18  Temp: 99.7 F (37.6 C)  SpO2: 97%

## 2021-07-08 ENCOUNTER — Other Ambulatory Visit: Payer: Self-pay | Admitting: Neurology

## 2021-07-16 NOTE — Patient Instructions (Signed)
Below is our plan:  We will continue gabapentin 600mg  three times daily, etodolac 400mg  twice daily and tramadol 50mg  three times daily. I have sent refills for tramadol and etodolac to your pharmacy. You should have enough gabapentin until you are seen again.   I am sending a referral to neuro rehab to get you started with PT. I feel activity can be very helpful in muscle strengthening and pain control. Please listen out for a call from PT to schedule an evaluation. They are located in our building.   I will talk to Dr when he returns to the office on Monday. I will discuss your blood pressures and pain with him. In the meantime, please keep a record of your BP readings at home. Take your BP in the morning, every morning and write it down. It may be a good idea to check it in the evenings as well (maybe 3-4 times a week) so we can see if there is a difference.   I have reviewed your last notes from [facility]. It looks like last sleep study was in 2009. 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 I do not see any indication of sleep apnea, however, it has been quite sometime since you were tested. I will order a repeat test that you can do at home. With the addition of new medications that can be sedating, you could have developed OSA. You should hear back from our office [facility]) to schedule this testing.   Please make sure you are staying well hydrated. I recommend 50-60 ounces daily. Well balanced diet and regular exercise encouraged. Consistent sleep schedule with 6-8 hours recommended.   Please continue follow up with care team as directed.   Follow up with Dr [facility] in 2-3 months.   You may receive a survey regarding today's visit. I encourage you to leave honest feed back as I do use this information to improve patient care. Thank you for seeing me today!

## 2021-07-16 NOTE — Progress Notes (Addendum)
Chief Complaint  Patient presents with   Follow-up    Rm 1, alone. Pt reports having good days and bad day. Currently have R elbow pn and swelling. Has L knee pn and swelling, using a knee brace today. Continues to have high blood pressure after infusions. Numbness and tingling in hand and fingers.     HISTORY OF PRESENT ILLNESS:  07/17/21 ALL:  Tommy Ellison is a 46 y.o. male here today for follow up for demyelinating polyneuropathy. CSF was normal. SPEP/IEF showed a polyclonal increase in IgM but no paraprotein. 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  he was initially treated with prednisone  daily but had minimal response. He was started on IVIG 70g x 2 days every month in 12/2020. Athena panel for hereditary sensorimotor polyneuropathy    He reports that infusions have helped with weakness and pain, however, he continues to have good and bad days. Infusions seem to help for about 2-2.5 weeks. He continues gabapentin  TID, etodolac  BID and tramadol  TID for neuropathy pain. Meds help decrease pain but minimally. Pain is a burning, stinging pain with painful cold sensation of both feet. Some days he does not want to get out of the bed. He feels gait is fairly stable with cane. He does have a fall about once per month. He has not worked with PT.   He was seen yesterday for infusion. RN notified me that he was snoring loudly and having apneic events. He reports having multiple sleep tests with a previous diagnosis of OSA and narcolepsy. He was previously treated with Ritalin and told he did not need CPAP. Review of Epic shows he was last seen by Rubye Oaks, NP with [facility]. Her note 01/2019 indicates NPSG 04/09/2008 showed AHI 0.2/hr. Diagnosis was idiopathic hypersomnia and he was treated with Ritalin  TID. He reports that this helped with daytime sleepiness but was discontinued  when he lost his insurance a couple of years ago and couldn't afford meds. ESS 14/24  He continues to see Lambert Mody with ortho in [facility] for right elbow pain and swelling. He fell on his elbow a couple months ago and continues to have pain and swelling. He recently completed a round of abx for possible cellulitis. He has had two joint effusions that were drained. He also has right sided knee pain. He is wearing a knee brace that helps.    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       Update 11/04/2020 He feels the numbness, pain and balance are worsening   He has back pain with an aching quality.  He also has dysesthetic pain in the feet with both a cold sensation and pins and needle sensation.      Prednisone had not helped and we stopped it.   He has lost the extra weight he gained.     He has not done the [facility] CMT panel yet and he will call them back.       He will be getting a cochlear implant soon.   P.o. discussed about his symptoms.  His mother accompanied him today and I reviewed the studies with her as well.   History of neuropathy:  He had generally been in good health until 2020.  He became deaf over a few days in July 2020.  However hearing had declined some in preceding month (mildly muffled).   He also became more ubbalanced that month and fell 12/03/2020.     He has had ED since 2020 as well.  Dysesthesias and numbness began around September or October 2020.  Balance worsened around that time as well and he had a fall at that time.  He feels symptoms have progressively worsened since the onset.   He notes mild weakness in his legs but not in his hands.      He notes no bladder changes.  He has a history of moderate alcohol abuse but drinks much less over the past couple of years.   CSF did not show elevated protein or any other abnormality.   This NCV/EMG study shows electrophysiologic  evidence of a length dependent motor and sensory demyelinating more than axonal polyneuropathy - mostly involvement of legs.     Gabapentin helps slightly and current dose is 600 mg 3 times daily.   Labs 11/02/2019.  SPEP/IEF showed a polyclonal increase in IgM.  SSA/SSB, cryoglobulins and thiamine were normal.   Anti-mag IgM was negative.  Hepatitis labs were negative.      I reviewed lab work from primary care.  B12 and TSH were normal.   He has no family history of polyneuropathy that he is aware of.   REVIEW OF SYSTEMS: Out of a complete 14 system review of symptoms, the patient complains only of the following symptoms, neuropathy pain and all other reviewed systems are negative.  Results of the Epworth flowsheet 07/17/2021  Sitting and reading 3  Watching TV 3  Sitting, inactive in a public place (e.g. a theatre or a meeting) 1  As a passenger in a car for an hour without a break 1  Lying down to rest in the afternoon when circumstances permit 3  Sitting and talking to someone 1  Sitting quietly after a lunch without alcohol 2  In a car, while stopped for a few minutes in traffic 0  Total score 14      ALLERGIES: No Known Allergies   HOME MEDICATIONS: Outpatient Medications Prior to Visit  Medication Sig Dispense Refill   acetaminophen (TYLENOL) 500 MG tablet Take 500 mg by mouth every 6 (six) hours as needed.     Cholecalciferol (VITAMIN D3 PO) Take 5,000 Units by mouth daily.     citalopram (CELEXA) 20 MG tablet Take 1 tablet (20 mg total) by mouth daily. 90 tablet 3   Cyanocobalamin (VITAMIN B-12 PO) Take 1,000 mcg by mouth daily.     Ferrous Sulfate (IRON PO) Take 65 mg by mouth 2 (two) times daily.     ferrous sulfate 325 (65 FE) MG tablet Take 325 mg by mouth 2 (two) times daily with a meal.     FOLIC ACID PO Take 800 mcg by mouth daily.     gabapentin (NEURONTIN) 600 MG tablet TAKE 1 TABLET BY MOUTH THREE TIMES A DAY 90 tablet 5   Multiple Vitamin (MULTIVITAMIN)  tablet Take 1 tablet by mouth daily.     pantoprazole (PROTONIX) 40 MG tablet Take 40 mg by mouth daily.     sildenafil (VIAGRA) 50 MG tablet Take 1 tablet (50 mg total) by mouth daily as needed for erectile dysfunction. Up to 10 pills/month 10 tablet 3   Thiamine HCl (VITAMIN B-1 PO) Take 500 mg by mouth daily.     etodolac (LODINE) 400 MG tablet TAKE 1 TABLET (400 MG TOTAL) BY MOUTH 2 (  TWO) TIMES DAILY. SCHEDULE FOLLOW UP FOR FUTURE REFILLS 60 tablet 0   traMADol (ULTRAM) 50 MG tablet Take 1 tablet (50 mg total) by mouth every 8 (eight) hours as needed. 90 tablet 0   No facility-administered medications prior to visit.     PAST MEDICAL HISTORY: Past Medical History:  Diagnosis Date   Anxiety    Hypersomnia    Rhinitis      PAST SURGICAL HISTORY: Past Surgical History:  Procedure Laterality Date   TOE SURGERY     WISDOM TOOTH EXTRACTION       FAMILY HISTORY: Family History  Problem Relation Age of Onset   Arthritis Other    Depression Mother      SOCIAL HISTORY: Social History   Socioeconomic History   Marital status: Divorced    Spouse name: Not on file   Number of children: Not on file   Years of education: Not on file   Highest education level: Not on file  Occupational History   Not on file  Tobacco Use   Smoking status: Never   Smokeless tobacco: Never  Substance and Sexual Activity   Alcohol use: Yes    Comment: daily    Drug use: No   Sexual activity: Not on file  Other Topics Concern   Not on file  Social History Narrative   Right handed   2 cokes per day   Social Determinants of Health   Financial Resource Strain: Not on file  Food Insecurity: Not on file  Transportation Needs: Not on file  Physical Activity: Not on file  Stress: Not on file  Social Connections: Not on file  Intimate Partner Violence: Not on file     PHYSICAL EXAM  Vitals:   07/17/21 0733  BP: (!) 138/96  Pulse: 97  Weight: 167 lb (75.8 kg)  Height: 5\' 9"   (1.753 m)   Body mass index is 24.66 kg/m.  Generalized: Well developed, in no acute distress  Cardiology: normal rate and rhythm, no murmur auscultated  Respiratory: clear to auscultation bilaterally    Neurological examination  Mentation: Alert oriented to time, place, history taking. Follows all commands speech and language fluent Cranial nerve II-XII: Pupils were equal round reactive to light. Extraocular movements were full, visual field were full on confrontational test. Facial sensation and strength were normal. Uvula tongue midline. Head turning and shoulder shrug  were normal and symmetric. Motor: The motor testing reveals 5 over 5 strength of all 4 extremities except dorsi and plantar flexion bilaterally.  Sensory: Sensory testing is intact to soft touch on all 4 extremities. Vibration reduced in knees and fingertips, pinprick reduced below mid shin.  No evidence of extinction is noted.  Coordination: Cerebellar testing reveals good finger-nose-finger and heel-to-shin bilaterally.  Gait and station: Gait is wide, stable with cane Tandem not attempted Reflexes: Deep tendon reflexes are symmetric and normal bilaterally in upper extremities, absent in lower extremities.    DIAGNOSTIC DATA (LABS, IMAGING, TESTING) - I reviewed patient records, labs, notes, testing and imaging myself where available.  Lab Results  Component Value Date   WBC 6.3 04/30/2011   HGB 16.6 04/30/2011   HCT 46.2 04/30/2011   MCV 87.8 04/30/2011   PLT 261 04/30/2011      Component Value Date/Time   NA 132 (L) 12/24/2020 1033   K 4.1 12/24/2020 1033   CL 91 (L) 12/24/2020 1033   CO2 19 (L) 12/24/2020 1033   GLUCOSE 102 (H) 12/24/2020 1033  GLUCOSE 105 (H) 04/30/2011 1109   BUN 2 (L) 12/24/2020 1033   CREATININE 0.64 (L) 12/24/2020 1033   CALCIUM 9.3 12/24/2020 1033   PROT 6.9 12/24/2020 1033   ALBUMIN 4.4 12/24/2020 1033   AST 40 12/24/2020 1033   ALT 15 12/24/2020 1033   ALKPHOS 132 (H)  12/24/2020 1033   BILITOT 1.2 12/24/2020 1033   GFRNONAA >90 04/30/2011 1109   GFRAA >90 04/30/2011 1109   No results found for: CHOL, HDL, LDLCALC, LDLDIRECT, TRIG, CHOLHDL No results found for: AVWU9W No results found for: VITAMINB12 No results found for: TSH  No flowsheet data found.   No flowsheet data found.   ASSESSMENT AND PLAN  46 y.o. year old male  has a past medical history of Anxiety, Hypersomnia, and Rhinitis. here with    Polyneuropathy - Plan: CBC with Differential/Platelets, CMP  High risk medication use  CIDP (chronic inflammatory demyelinating polyneuropathy) (HCC) - Plan: Ambulatory referral to Physical Therapy  Falls frequently - Plan: Ambulatory referral to Physical Therapy  Witnessed apneic spells - Plan: Home sleep test  Daytime sleepiness - Plan: Home sleep test  Other chronic pain  Tommy Ellison does report some improvement in weakness and neuropathic pain with IVIG. We will continue infusions as prescribed. He does have significant breakthrough pain. We have discussed starting duloxetine. He is taking citalopram  and tolerating well. We will discuss this and other options with Dr Epimenio Foot once he returns to the office next week. For now, he will continue gabapentin  TID, etodolac  BID and tramadol  TID. PDMP appropriate. Tramadol refilled in Dr Bonnita Hollow absence. I will also order PT for gait eval and strengthening. He was encouraged to continue using cane at all times. Fall precautions reviewed. He reports history of narcolepsy and OSA. I do not see MSLT. Last NPSG in 2009 showed AHI2/hr. I do not feel he would be able to wean pain medications at this time. ESS is 14/24. I will order a repeat AHI for EDS and reported snoring/history of OSA. He was encouraged to continue healthy lifestyle habits. I will have him follow up with Dr Epimenio Foot in 2-3 months. He verbalizes understanding and agreement wit this plan.   Orders Placed This Encounter  Procedures    CBC with Differential/Platelets   CMP   Ambulatory referral to Physical Therapy    Referral Priority:   Routine    Referral Type:   Physical Medicine    Referral Reason:   Specialty Services Required    Requested Specialty:   Physical Therapy    Number of Visits Requested:   1   Home sleep test    Reports previous hx of OSA, witnessed apnea in infusion center, Dr Bonnita Hollow patient.    Standing Status:   Future    Standing Expiration Date:   07/17/2022    Order Specific Question:   Where should this test be performed:    Answer:   [facility]     Meds ordered this encounter  Medications   traMADol (ULTRAM) 50 MG tablet    Sig: Take 1 tablet (50 mg total) by mouth every 8 (eight) hours as needed.    Dispense:  90 tablet    Refill:  0    This request is for a new prescription for a controlled substance as required by Federal/State law.    Order Specific Question:   Supervising Provider    Answer:   Anson Fret [[id number]]   etodolac (LODINE)  400 MG tablet    Sig: TAKE 1 TABLET (400 MG TOTAL) BY MOUTH 2 (TWO) TIMES DAILY. SCHEDULE FOLLOW UP FOR FUTURE REFILLS    Dispense:  60 tablet    Refill:  5    Order Specific Question:   Supervising Provider    Answer:   Anson Fret [[id number]]      Shawnie Dapper, MSN, FNP-C 07/17/2021, 4:19 PM  [facility] [address] [place], [place] [address] [phone]  I have read the note, and I agree with the clinical assessment and plan.  Richard A. Epimenio Foot, MD, PhD, [facility] Certified in Neurology, Clinical Neurophysiology, Sleep Medicine, Pain Medicine and Neuroimaging  [facility] [address], [address] [place], [place] [address] [phone]

## 2021-07-17 ENCOUNTER — Encounter: Payer: Self-pay | Admitting: Family Medicine

## 2021-07-17 ENCOUNTER — Ambulatory Visit: Payer: Medicaid Other | Admitting: Family Medicine

## 2021-07-17 VITALS — BP 138/96 | HR 97 | Ht 69.0 in | Wt 167.0 lb

## 2021-07-17 DIAGNOSIS — R296 Repeated falls: Secondary | ICD-10-CM | POA: Diagnosis not present

## 2021-07-17 DIAGNOSIS — G629 Polyneuropathy, unspecified: Secondary | ICD-10-CM

## 2021-07-17 DIAGNOSIS — R400 Somnolence: Secondary | ICD-10-CM

## 2021-07-17 DIAGNOSIS — Z79899 Other long term (current) drug therapy: Secondary | ICD-10-CM

## 2021-07-17 DIAGNOSIS — R0681 Apnea, not elsewhere classified: Secondary | ICD-10-CM

## 2021-07-17 DIAGNOSIS — G8929 Other chronic pain: Secondary | ICD-10-CM

## 2021-07-17 DIAGNOSIS — G6181 Chronic inflammatory demyelinating polyneuritis: Secondary | ICD-10-CM

## 2021-07-17 MED ORDER — TRAMADOL HCL 50 MG PO TABS: 50.0000 mg | ORAL_TABLET | Freq: Three times a day (TID) | ORAL | 0 refills | Status: DC | PRN

## 2021-07-17 MED ORDER — ETODOLAC 400 MG PO TABS: ORAL_TABLET | ORAL | 5 refills | Status: DC

## 2021-07-18 LAB — COMPREHENSIVE METABOLIC PANEL
ALT: 15 IU/L (ref 0–44)
AST: 47 IU/L — ABNORMAL HIGH (ref 0–40)
Albumin/Globulin Ratio: 0.9 — ABNORMAL LOW (ref 1.2–2.2)
Albumin: 4 g/dL (ref 4.0–5.0)
Alkaline Phosphatase: 127 IU/L — ABNORMAL HIGH (ref 44–121)
BUN/Creatinine Ratio: 6 — ABNORMAL LOW (ref 9–20)
BUN: 3 mg/dL — ABNORMAL LOW (ref 6–24)
Bilirubin Total: 0.5 mg/dL (ref 0.0–1.2)
CO2: 21 mmol/L (ref 20–29)
Calcium: 8.8 mg/dL (ref 8.7–10.2)
Chloride: 92 mmol/L — ABNORMAL LOW (ref 96–106)
Creatinine, Ser: 0.52 mg/dL — ABNORMAL LOW (ref 0.76–1.27)
Globulin, Total: 4.7 g/dL — ABNORMAL HIGH (ref 1.5–4.5)
Glucose: 77 mg/dL (ref 70–99)
Potassium: 4.1 mmol/L (ref 3.5–5.2)
Sodium: 127 mmol/L — ABNORMAL LOW (ref 134–144)
Total Protein: 8.7 g/dL — ABNORMAL HIGH (ref 6.0–8.5)
eGFR: 127 mL/min/{1.73_m2} (ref >59)

## 2021-07-18 LAB — CBC WITH DIFFERENTIAL/PLATELET
Basophils Absolute: 0.1 10*3/uL (ref 0.0–0.2)
Basos: 1 %
EOS (ABSOLUTE): 0.1 10*3/uL (ref 0.0–0.4)
Eos: 3 %
Hematocrit: 33.3 % — ABNORMAL LOW (ref 37.5–51.0)
Hemoglobin: 11.8 g/dL — ABNORMAL LOW (ref 13.0–17.7)
Immature Grans (Abs): 0 10*3/uL (ref 0.0–0.1)
Immature Granulocytes: 0 %
Lymphocytes Absolute: 0.8 10*3/uL (ref 0.7–3.1)
Lymphs: 22 %
MCH: 39.2 pg — ABNORMAL HIGH (ref 26.6–33.0)
MCHC: 35.4 g/dL (ref 31.5–35.7)
MCV: 111 fL — ABNORMAL HIGH (ref 79–97)
Monocytes Absolute: 0.6 10*3/uL (ref 0.1–0.9)
Monocytes: 15 %
Neutrophils Absolute: 2.2 10*3/uL (ref 1.4–7.0)
Neutrophils: 59 %
Platelets: 193 10*3/uL (ref 150–450)
RBC: 3.01 x10E6/uL — ABNORMAL LOW (ref 4.14–5.80)
RDW: 11 % — ABNORMAL LOW (ref 11.6–15.4)
WBC: 3.8 10*3/uL (ref 3.4–10.8)

## 2021-07-24 DIAGNOSIS — M25562 Pain in left knee: Secondary | ICD-10-CM | POA: Insufficient documentation

## 2021-08-11 ENCOUNTER — Other Ambulatory Visit: Payer: Self-pay | Admitting: Family Medicine

## 2021-08-11 MED ORDER — TRAMADOL HCL 50 MG PO TABS
50.0000 mg | ORAL_TABLET | Freq: Three times a day (TID) | ORAL | 0 refills | Status: DC | PRN
Start: 2021-08-11 — End: 2021-09-18

## 2021-08-11 NOTE — Telephone Encounter (Signed)
At 3:11pm [place] @ [facility] in [employer] @ [address] left vm stating pt's mother is asking that pt's Rx for traMADol (ULTRAM) 50 MG tablet  be sent to them, their call back # is [phone] ?

## 2021-08-11 NOTE — Telephone Encounter (Signed)
I have routed this request to Dr Sater for review. The pt is due for the medication and [place] registry was verified.  

## 2021-08-13 ENCOUNTER — Other Ambulatory Visit (INDEPENDENT_AMBULATORY_CARE_PROVIDER_SITE_OTHER): Payer: Medicaid Other

## 2021-08-13 ENCOUNTER — Ambulatory Visit (INDEPENDENT_AMBULATORY_CARE_PROVIDER_SITE_OTHER): Payer: Medicaid Other | Admitting: Neurology

## 2021-08-13 ENCOUNTER — Other Ambulatory Visit: Payer: Self-pay | Admitting: Family Medicine

## 2021-08-13 DIAGNOSIS — G4733 Obstructive sleep apnea (adult) (pediatric): Secondary | ICD-10-CM | POA: Diagnosis not present

## 2021-08-13 DIAGNOSIS — R400 Somnolence: Secondary | ICD-10-CM

## 2021-08-13 DIAGNOSIS — G35 Multiple sclerosis: Secondary | ICD-10-CM

## 2021-08-13 DIAGNOSIS — Z0289 Encounter for other administrative examinations: Secondary | ICD-10-CM

## 2021-08-13 DIAGNOSIS — R0681 Apnea, not elsewhere classified: Secondary | ICD-10-CM

## 2021-08-13 NOTE — Progress Notes (Signed)
? ? ? ? ? ?[facility] ? ?PATIENT: Tommy Ellison ?DOB: 1976/03/24 ? ?REFERRING DOCTOR OR PCP: Henrine Screws, MD ?SOURCE: Patient, notes from primary care ? ?_________________________________ ? ? ?HISTORICAL ? ?CHIEF COMPLAINT:  ?No chief complaint on file. ? ? 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     ? ?Update 11/04/2020 ?He feels the numbness, pain and balance are worsening   He has back pain with an aching quality.  He also has dysesthetic pain in the feet with both a cold sensation and pins and needle sensation.    ? ?Prednisone had not helped and we stopped it.   He has lost the extra weight he gained.     He has not done the [facility] CMT panel yet and he will call them back.     ? ?He will be getting a cochlear implant soon. ? ?P.o. discussed about his symptoms.  His mother accompanied him today and I reviewed the studies with her as well. ? ?History of neuropathy:  ?He had generally been in good health until 2020.  He became deaf over a few days in July 2020. However hearing had declined some in preceding month (mildly muffled).   He also became more ubbalanced that month and fell 12/03/2020.     He has had ED since 2020 as well.  Dysesthesias and numbness began around September or October 2020.  Balance worsened around that time as well and he had a fall at that time.  He feels symptoms have progressively worsened since the onset.   He notes mild weakness in his legs but not in his hands.      He notes no bladder changes.  He has a history of moderate alcohol abuse but drinks much less over the past couple of years. ? ?CSF did not show elevated protein or any other abnormality.   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   ? ?Gabapentin helps slightly and current dose is 600 mg 3 times  daily. ? ?Labs 11/02/2019.  SPEP/IEF showed a polyclonal increase in IgM.  SSA/SSB, cryoglobulins and thiamine were normal.   Anti-mag IgM was negative.  Hepatitis labs were negative.      I reviewed lab work from primary care.  B12 and TSH were normal. ? ?He has no family history of polyneuropathy that he is aware of. ? ?REVIEW OF SYSTEMS: ?Constitutional: No fevers, chills, sweats, or change in appetite ?Eyes: No visual changes, double vision, eye pain ?Ear, nose and throat: No hearing loss, ear pain, nasal congestion, sore throat ?Cardiovascular: No chest pain, palpitations ?Respiratory:  No shortness of breath at rest or with exertion.   No wheezes ?GastrointestinaI: No nausea, vomiting, diarrhea, abdominal pain, fecal incontinence ?Genitourinary:  No dysuria, urinary retention or frequency.  No nocturia. ?Musculoskeletal:  No neck pain, back pain ?Integumentary: No rash, pruritus, skin lesions ?Neurological: as above ?Psychiatric: No depression at this time.  No anxiety ?Endocrine: No palpitations, diaphoresis, change in appetite, change in weigh or increased thirst ?Hematologic/Lymphatic:  No anemia, purpura, petechiae. ?Allergic/Immunologic: No itchy/runny eyes, nasal congestion, recent allergic reactions, rashes ? ?ALLERGIES: ?No Known Allergies ? ?HOME MEDICATIONS: ? ?Current Outpatient Medications:  ?  acetaminophen (TYLENOL) 500 MG tablet, Take 500 mg by mouth every 6 (six) hours as needed., Disp: , Rfl:  ?  Cholecalciferol (  VITAMIN D3 PO), Take 5,000 Units by mouth daily., Disp: , Rfl:  ?  citalopram (CELEXA) 20 MG tablet, Take 1 tablet (20 mg total) by mouth daily., Disp: 90 tablet, Rfl: 3 ?  Cyanocobalamin (VITAMIN B-12 PO), Take 1,000 mcg by mouth daily., Disp: , Rfl:  ?  etodolac (LODINE) 400 MG tablet, TAKE 1 TABLET (400 MG TOTAL) BY MOUTH 2 (TWO) TIMES DAILY. SCHEDULE FOLLOW UP FOR FUTURE REFILLS, Disp: 60 tablet, Rfl: 5 ?  Ferrous Sulfate (IRON PO), Take 65 mg by mouth 2 (two) times daily., Disp: ,  Rfl:  ?  ferrous sulfate 325 (65 FE) MG tablet, Take 325 mg by mouth 2 (two) times daily with a meal., Disp: , Rfl:  ?  FOLIC ACID PO, Take 800 mcg by mouth daily., Disp: , Rfl:  ?  gabapentin (NEURONTIN) 600 MG tablet, TAKE 1 TABLET BY MOUTH THREE TIMES A DAY, Disp: 90 tablet, Rfl: 5 ?  Multiple Vitamin (MULTIVITAMIN) tablet, Take 1 tablet by mouth daily., Disp: , Rfl:  ?  pantoprazole (PROTONIX) 40 MG tablet, Take 40 mg by mouth daily., Disp: , Rfl:  ?  sildenafil (VIAGRA) 50 MG tablet, Take 1 tablet (50 mg total) by mouth daily as needed for erectile dysfunction. Up to 10 pills/month, Disp: 10 tablet, Rfl: 3 ?  Thiamine HCl (VITAMIN B-1 PO), Take 500 mg by mouth daily., Disp: , Rfl:  ?  traMADol (ULTRAM) 50 MG tablet, Take 1 tablet (50 mg total) by mouth every 8 (eight) hours as needed., Disp: 90 tablet, Rfl: 0 ? ?PAST MEDICAL HISTORY: ?Past Medical History:  ?Diagnosis Date  ? Anxiety   ? Hypersomnia   ? Rhinitis   ? ? ?PAST SURGICAL HISTORY: ?Past Surgical History:  ?Procedure Laterality Date  ? TOE SURGERY    ? WISDOM TOOTH EXTRACTION    ? ? ?FAMILY HISTORY: ?Family History  ?Problem Relation Age of Onset  ? Arthritis Other   ? Depression Mother   ? ? ?SOCIAL HISTORY: ? ?Social History  ? ?Socioeconomic History  ? Marital status: Divorced  ?  Spouse name: Not on file  ? Number of children: Not on file  ? Years of education: Not on file  ? Highest education level: Not on file  ?Occupational History  ? Not on file  ?Tobacco Use  ? Smoking status: Never  ? Smokeless tobacco: Never  ?Substance and Sexual Activity  ? Alcohol use: Yes  ?  Comment: daily   ? Drug use: No  ? Sexual activity: Not on file  ?Other Topics Concern  ? Not on file  ?Social History Narrative  ? Right handed  ? 2 cokes per day  ? ?Social Determinants of Health  ? ?Financial Resource Strain: Not on file  ?Food Insecurity: Not on file  ?Transportation Needs: Not on file  ?Physical Activity: Not on file  ?Stress: Not on file  ?Social Connections:  Not on file  ?Intimate Partner Violence: Not on file  ? ? ? ?PHYSICAL EXAM ? ?There were no vitals filed for this visit. ? ? ?There is no height or weight on file to calculate BMI. ? ? ?General: The patient is well-developed and well-nourished and in no acute distress ? ?HEENT:  Head is [place]/AT.  Sclera are anicteric.  ? ?Neck/musculoskeletal: The neck is nontender with good range of motion.  He has some tenderness over the lumbar spine and paraspinal muscles ? ?Skin: Extremities are without rash or  edema. ? ?Neurologic Exam ? ?Mental  status: The patient is alert and oriented x 3 at the time of the examination. The patient has apparent normal recent and remote memory, with an apparently normal attention span and concentration ability.   Speech is normal ? ?Cranial nerves: Extraocular movements are full. Pupils are small and not definitely reactive.  Visual fields are full.  Facial symmetry is present. There is good facial sensation to soft touch bilaterally.Facial strength is normal.  Trapezius and sternocleidomastoid strength is normal. No dysarthria is noted.   He is deaf ? ?Motor:  Muscle bulk is normal.   Tone is normal. Strength is  5 / 5 in all 4 extremities except 4+/5 in the intrinsic foot muscles and EHL muscles.  ? ?Sensory: He has mildly reduced vibration sensation (75%) in the knees and fingertips relative to the proximal arms.  He has only 20 to 25% sensation at the ankles and negligible vibration sensation at the toes.  Pinprick sensation was reduced below mid shin. ? ?Coordination: Cerebellar testing reveals good finger-nose-finger and heel-to-shin bilaterally. ? ?Gait and station: Station is normal.   Gait is wide.. Tandem gait is moderately wide.  Romberg is borderline..  ? ?Reflexes: Deep tendon reflexes are symmetric and normal in the arms but absent in the legs.   . ? ? ? ? ? ?ASSESSMENT AND PLAN ? ?Witnessed apneic spells - Plan: Home sleep test ? ?Daytime sleepiness - Plan: Home sleep test ? ?1.   He has a polyneuropathy that appeared to progress rapidly in 2020 and has progressed more slowly over the last year.  He does have a polyclonal IgM elevation but no paraprotein.  CSF did not show elevated p

## 2021-08-13 NOTE — Progress Notes (Signed)
Patient is here, today, for infusion. Discussed lab results sent via Mychart. I will repeat chemistry to assess sodium and liver enzymes.  ?

## 2021-08-16 LAB — COMPREHENSIVE METABOLIC PANEL
ALT: 22 IU/L (ref 0–44)
Albumin/Globulin Ratio: 0.6 — ABNORMAL LOW (ref 1.2–2.2)
Albumin: 3.7 g/dL — ABNORMAL LOW (ref 4.0–5.0)
Alkaline Phosphatase: 100 IU/L (ref 44–121)
BUN/Creatinine Ratio: 8 — ABNORMAL LOW (ref 9–20)
BUN: 5 mg/dL — ABNORMAL LOW (ref 6–24)
Bilirubin Total: 1.1 mg/dL (ref 0.0–1.2)
CO2: 19 mmol/L — ABNORMAL LOW (ref 20–29)
Calcium: 9 mg/dL (ref 8.7–10.2)
Chloride: 91 mmol/L — ABNORMAL LOW (ref 96–106)
Creatinine, Ser: 0.6 mg/dL — ABNORMAL LOW (ref 0.76–1.27)
Globulin, Total: 6.2 g/dL — ABNORMAL HIGH (ref 1.5–4.5)
Glucose: 84 mg/dL (ref 70–99)
Sodium: 127 mmol/L — ABNORMAL LOW (ref 134–144)
Total Protein: 9.9 g/dL — ABNORMAL HIGH (ref 6.0–8.5)
eGFR: 121 mL/min/{1.73_m2} (ref >59)

## 2021-08-18 DIAGNOSIS — R400 Somnolence: Secondary | ICD-10-CM | POA: Insufficient documentation

## 2021-08-18 DIAGNOSIS — G4733 Obstructive sleep apnea (adult) (pediatric): Secondary | ICD-10-CM | POA: Insufficient documentation

## 2021-08-18 NOTE — Progress Notes (Addendum)
? ?  [facility] ? ?HOME SLEEP STUDY ? ?STUDY DATE: 08/13/2021 ?PATIENT NAME: Tommy Ellison ?DOB: [birth year] ?MRN: [id number] ? ?ORDERING CLINICIAN: Richard A. Epimenio Foot, MD, PhD ?REFERRING CLINICIAN: Richard A. Epimenio Foot, MD. PhD ? ? ?CLINICAL INFORMATION: 46 year old man with snoring, witnessed OSA and excessive daytime sleepiness ? ?FINDINGS:  ?Total Record Time: 9 hours 54 minutes ?Total Sleep Time:  6 hours 19 minutes  ?Percent REM:   21.5%  ? ?Calculated pAHI:  24.8/h       REM pAHI:    33.7/h     NREM pAHI: 22.3/h ? ?Pulse Mean:    73 bpm  pulse Range (58 to 101 bpm)  ?  ?Oxygen Sat% Mean: 95%  O2Sat Range (76-99%)  O2Sat <88%: 9 minutes ? ? ? ?IMPRESSION:  ?This home sleep study shows moderate obstructive sleep apnea with a pAHI of 24.8% ? ? ?RECOMMENDATION: ?AutoPap 5-20 cm H2O with heated humidifier.  Download in 30 to 90 days ?Follow-up with Dr. Epimenio Foot ? ? ?INTERPRETING PHYSICIAN:  ? ?Richard A. Epimenio Foot, MD, PhD, [place] ?Certified in Neurology, Clinical Neurophysiology, Sleep Medicine, Pain Medicine and Neuroimaging ? ?[facility] ?[address], [address] ?[place], [place] [address] ?([phone] ? ? ? ? ? ? ? ? ? ? ? ? ? ? ? ? ? ? ? ? ? ? ? ? ? ? ?

## 2021-08-19 ENCOUNTER — Other Ambulatory Visit: Payer: Self-pay | Admitting: Neurology

## 2021-08-19 ENCOUNTER — Encounter: Payer: Self-pay | Admitting: Neurology

## 2021-08-19 DIAGNOSIS — G35 Multiple sclerosis: Secondary | ICD-10-CM

## 2021-08-19 DIAGNOSIS — R400 Somnolence: Secondary | ICD-10-CM

## 2021-08-19 DIAGNOSIS — G4733 Obstructive sleep apnea (adult) (pediatric): Secondary | ICD-10-CM

## 2021-08-19 DIAGNOSIS — R0681 Apnea, not elsewhere classified: Secondary | ICD-10-CM

## 2021-09-18 ENCOUNTER — Other Ambulatory Visit: Payer: Self-pay | Admitting: Family Medicine

## 2021-09-18 MED ORDER — TRAMADOL HCL 50 MG PO TABS: 50.0000 mg | ORAL_TABLET | Freq: Three times a day (TID) | ORAL | 0 refills | Status: DC | PRN

## 2021-09-18 NOTE — Telephone Encounter (Signed)
Pt is requesting a refill for traMADol (ULTRAM) 50 MG tablet. ? ?Pharmacy:  [facility] ? ?

## 2021-10-14 ENCOUNTER — Other Ambulatory Visit: Payer: Self-pay | Admitting: Neurology

## 2021-10-14 MED ORDER — TRAMADOL HCL 50 MG PO TABS: 50.0000 mg | ORAL_TABLET | Freq: Three times a day (TID) | ORAL | 0 refills | Status: DC | PRN

## 2021-10-14 NOTE — Telephone Encounter (Signed)
Last OV was on 07/17/21.  Next OV is scheduled for 11/20/21.  Last RX was written on 09/18/21 for 90 tabs.   [place] [facility] has been reviewed.

## 2021-10-21 ENCOUNTER — Other Ambulatory Visit: Payer: Self-pay | Admitting: Neurology

## 2021-10-22 MED ORDER — TRAMADOL HCL 50 MG PO TABS: 50.0000 mg | ORAL_TABLET | Freq: Three times a day (TID) | ORAL | 0 refills | Status: DC | PRN

## 2021-10-22 NOTE — Telephone Encounter (Signed)
Last OV was on 07/17/21.  Next OV is scheduled for 11/20/21.  Last RX was written on 09/18/21 for 90 tabs.   [place] [facility] Database has been reviewed.   Patient comment: The insurance will only allow he tramadol to be filled at the [facility]. It has to do with keeping the scheduled meds at one pharmacy. Any script that is not scheduled is to be called in to [facility] [address]. TY

## 2021-11-10 ENCOUNTER — Ambulatory Visit: Payer: Medicaid Other | Admitting: Internal Medicine

## 2021-11-13 ENCOUNTER — Other Ambulatory Visit: Payer: Self-pay | Admitting: Family Medicine

## 2021-11-13 MED ORDER — TRAMADOL HCL 50 MG PO TABS: 50.0000 mg | ORAL_TABLET | Freq: Three times a day (TID) | ORAL | 0 refills | Status: DC | PRN

## 2021-11-13 NOTE — Telephone Encounter (Signed)
Pt is needing a refill request for his traMADol (ULTRAM) 50 MG tablet [facility] [address]

## 2021-11-19 NOTE — Progress Notes (Unsigned)
No chief complaint on file.   HISTORY OF PRESENT ILLNESS:  11/19/21 ALL:  Tommy Ellison returns for initial ocmpliance review. HST 08/13/2021 showed moderate OSA with AHI 24.8/hr, REM AHI 33.7/hr   07/17/2021 ALL:  Tommy Ellison is a 46 y.o. male here today for follow up for demyelinating polyneuropathy. CSF was normal. SPEP/IEF showed a polyclonal increase in IgM but no paraprotein. 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  he was initially treated with prednisone 60mg  daily but had minimal response. He was started on IVIG 70g x 2 days every month in 12/2020. Athena panel for hereditary sensorimotor polyneuropathy    He reports that infusions have helped with weakness and pain, however, he continues to have good and bad days. Infusions seem to help for about 2-2.5 weeks. He continues gabapentin 600mg  TID, etodolac 400mg  BID and tramadol 50mg  TID for neuropathy pain. Meds help decrease pain but minimally. Pain is a burning, stinging pain with painful cold sensation of both feet. Some days he does not want to get out of the bed. He feels gait is fairly stable with cane. He does have a fall about once per month. He has not worked with PT.   He was seen yesterday for infusion. RN notified me that he was snoring loudly and having apneic events. He reports having multiple sleep tests with a previous diagnosis of OSA and narcolepsy. He was previously treated with Ritalin and told he did not need CPAP. Review of Epic shows he was last seen by 01/2021, NP with [facility]. Her note 01/2019 indicates NPSG 04/09/2008 showed AHI 0.2/hr. Diagnosis was idiopathic hypersomnia and he was treated with Ritalin 10mg  TID. He reports that this helped with daytime sleepiness but was discontinued when he lost his insurance a couple of years ago and couldn't afford meds. ESS 14/24  He continues to see Rubye Oaks with ortho in  [facility] for right elbow pain and swelling. He fell on his elbow a couple months ago and continues to have pain and swelling. He recently completed a round of abx for possible cellulitis. He has had two joint effusions that were drained. He also has right sided knee pain. He is wearing a knee brace that helps.    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       Update 11/04/2020 He feels the numbness, pain and balance are worsening   He has back pain with an aching quality.  He also has dysesthetic pain in the feet with both a cold sensation and pins and needle sensation.      Prednisone had not helped and we stopped it.   He has lost the extra weight he gained.     He has not done the [facility] CMT panel yet and he will call them back.       He will be getting a cochlear implant soon.   P.o. discussed about his symptoms.  His mother accompanied him today and I reviewed the studies with her as well.   History of neuropathy:  He had generally been in good health until 2020.  He became deaf over a few days in July 2020. However hearing had declined some in preceding month (mildly muffled).   He also became more ubbalanced that month and fell 12/03/2020.     He has had ED since  2020 as well.  Dysesthesias and numbness began around September or October 2020.  Balance worsened around that time as well and he had a fall at that time.  He feels symptoms have progressively worsened since the onset.   He notes mild weakness in his legs but not in his hands.      He notes no bladder changes.  He has a history of moderate alcohol abuse but drinks much less over the past couple of years.   CSF did not show elevated protein or any other abnormality.   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      Gabapentin helps slightly and current dose is 600 mg 3 times daily.   Labs 11/02/2019.  SPEP/IEF showed a polyclonal increase in IgM.  SSA/SSB, cryoglobulins and thiamine were normal.   Anti-mag IgM was negative.  Hepatitis labs were negative.      I reviewed lab work from primary care.  B12 and TSH were normal.   He has no family history of polyneuropathy that he is aware of.   REVIEW OF SYSTEMS: Out of a complete 14 system review of symptoms, the patient complains only of the following symptoms, neuropathy pain and all other reviewed systems are negative.     07/17/2021    4:00 PM  Results of the Epworth flowsheet  Sitting and reading 3  Watching TV 3  Sitting, inactive in a public place (e.g. a theatre or a meeting) 1  As a passenger in a car for an hour without a break 1  Lying down to rest in the afternoon when circumstances permit 3  Sitting and talking to someone 1  Sitting quietly after a lunch without alcohol 2  In a car, while stopped for a few minutes in traffic 0  Total score 14      ALLERGIES: No Known Allergies   HOME MEDICATIONS: Outpatient Medications Prior to Visit  Medication Sig Dispense Refill   acetaminophen (TYLENOL) 500 MG tablet Take 500 mg by mouth every 6 (six) hours as needed.     Cholecalciferol (VITAMIN D3 PO) Take 5,000 Units by mouth daily.     citalopram (CELEXA) 20 MG tablet Take 1 tablet (20 mg total) by mouth daily. 90 tablet 3   Cyanocobalamin (VITAMIN B-12 PO) Take 1,000 mcg by mouth daily.     etodolac (LODINE) 400 MG tablet TAKE 1 TABLET (400 MG TOTAL) BY MOUTH 2 (TWO) TIMES DAILY. SCHEDULE FOLLOW UP FOR FUTURE REFILLS 60 tablet 5   Ferrous Sulfate (IRON PO) Take 65 mg by mouth 2 (two) times daily.     ferrous sulfate 325 (65 FE) MG tablet Take 325 mg by mouth 2 (two) times daily with a meal.     FOLIC ACID PO Take 800 mcg by mouth daily.     gabapentin (NEURONTIN) 600 MG tablet TAKE 1 TABLET BY MOUTH THREE TIMES A DAY 90 tablet 5    Multiple Vitamin (MULTIVITAMIN) tablet Take 1 tablet by mouth daily.     pantoprazole (PROTONIX) 40 MG tablet Take 40 mg by mouth daily.     sildenafil (VIAGRA) 50 MG tablet Take 1 tablet (50 mg total) by mouth daily as needed for erectile dysfunction. Up to 10 pills/month 10 tablet 3   Thiamine HCl (VITAMIN B-1 PO) Take 500 mg by mouth daily.     traMADol (ULTRAM) 50 MG tablet Take 1 tablet (  50 mg total) by mouth every 8 (eight) hours as needed. 90 tablet 0   No facility-administered medications prior to visit.     PAST MEDICAL HISTORY: Past Medical History:  Diagnosis Date   Anxiety    Hypersomnia    Rhinitis      PAST SURGICAL HISTORY: Past Surgical History:  Procedure Laterality Date   TOE SURGERY     WISDOM TOOTH EXTRACTION       FAMILY HISTORY: Family History  Problem Relation Age of Onset   Arthritis Other    Depression Mother      SOCIAL HISTORY: Social History   Socioeconomic History   Marital status: Divorced    Spouse name: Not on file   Number of children: Not on file   Years of education: Not on file   Highest education level: Not on file  Occupational History   Not on file  Tobacco Use   Smoking status: Never   Smokeless tobacco: Never  Substance and Sexual Activity   Alcohol use: Yes    Comment: daily    Drug use: No   Sexual activity: Not on file  Other Topics Concern   Not on file  Social History Narrative   Right handed   2 cokes per day   Social Determinants of Health   Financial Resource Strain: Not on file  Food Insecurity: Not on file  Transportation Needs: Not on file  Physical Activity: Not on file  Stress: Not on file  Social Connections: Not on file  Intimate Partner Violence: Not on file     PHYSICAL EXAM  There were no vitals filed for this visit.  There is no height or weight on file to calculate BMI.  Generalized: Well developed, in no acute distress  Cardiology: normal rate and rhythm, no murmur auscultated   Respiratory: clear to auscultation bilaterally    Neurological examination  Mentation: Alert oriented to time, place, history taking. Follows all commands speech and language fluent Cranial nerve II-XII: Pupils were equal round reactive to light. Extraocular movements were full, visual field were full on confrontational test. Facial sensation and strength were normal. Uvula tongue midline. Head turning and shoulder shrug  were normal and symmetric. Motor: The motor testing reveals 5 over 5 strength of all 4 extremities except dorsi and plantar flexion bilaterally.  Sensory: Sensory testing is intact to soft touch on all 4 extremities. Vibration reduced in knees and fingertips, pinprick reduced below mid shin.  No evidence of extinction is noted.  Coordination: Cerebellar testing reveals good finger-nose-finger and heel-to-shin bilaterally.  Gait and station: Gait is wide, stable with cane Tandem not attempted Reflexes: Deep tendon reflexes are symmetric and normal bilaterally in upper extremities, absent in lower extremities.    DIAGNOSTIC DATA (LABS, IMAGING, TESTING) - I reviewed patient records, labs, notes, testing and imaging myself where available.  Lab Results  Component Value Date   WBC 3.8 07/17/2021   HGB 11.8 (L) 07/17/2021   HCT 33.3 (L) 07/17/2021   MCV 111 (H) 07/17/2021   PLT 193 07/17/2021      Component Value Date/Time   NA 127 (L) 08/13/2021 1438   K CANCELED 08/13/2021 1438   CL 91 (L) 08/13/2021 1438   CO2 19 (L) 08/13/2021 1438   GLUCOSE 84 08/13/2021 1438   GLUCOSE 105 (H) 04/30/2011 1109   BUN 5 (L) 08/13/2021 1438   CREATININE 0.60 (L) 08/13/2021 1438   CALCIUM 9.0 08/13/2021 1438   PROT 9.9 (H) 08/13/2021  1438   ALBUMIN 3.7 (L) 08/13/2021 1438   AST CANCELED 08/13/2021 1438   ALT 22 08/13/2021 1438   ALKPHOS 100 08/13/2021 1438   BILITOT 1.1 08/13/2021 1438   GFRNONAA >90 04/30/2011 1109   GFRAA >90 04/30/2011 1109   No results found for: "CHOL",  "HDL", "LDLCALC", "LDLDIRECT", "TRIG", "CHOLHDL" No results found for: "HGBA1C" No results found for: "VITAMINB12" No results found for: "TSH"      No data to display               No data to display           ASSESSMENT AND PLAN  46 y.o. year old male  has a past medical history of Anxiety, Hypersomnia, and Rhinitis. here with    No diagnosis found.  Tommy Ellison does report some improvement in weakness and neuropathic pain with IVIG. We will continue infusions as prescribed. He does have significant breakthrough pain. We have discussed starting duloxetine. He is taking citalopram 20mg  and tolerating well. We will discuss this and other options with Dr once he returns to the office next week. For now, he will continue gabapentin 600mg  TID, etodolac 400mg  BID and tramadol 50mg  TID. PDMP appropriate. Tramadol refilled in Dr Epimenio Foot absence. I will also order PT for gait eval and strengthening. He was encouraged to continue using cane at all times. Fall precautions reviewed. He reports history of narcolepsy and OSA. I do not see MSLT. Last NPSG in 2009 showed AHI2/hr. I do not feel he would be able to wean pain medications at this time. ESS is 14/24. I will order a repeat AHI for EDS and reported snoring/history of OSA. He was encouraged to continue healthy lifestyle habits. I will have him follow up with Dr in 2-3 months. He verbalizes understanding and agreement wit this plan.   No orders of the defined types were placed in this encounter.    No orders of the defined types were placed in this encounter.     , MSN, FNP-C 11/19/2021, 4:56 PM  [facility] [address], [address] [place], 11/21/2021 [facility] [phone]

## 2021-11-20 ENCOUNTER — Telehealth: Payer: Self-pay | Admitting: *Deleted

## 2021-11-20 ENCOUNTER — Encounter: Payer: Self-pay | Admitting: Family Medicine

## 2021-11-20 ENCOUNTER — Ambulatory Visit (INDEPENDENT_AMBULATORY_CARE_PROVIDER_SITE_OTHER): Payer: Medicaid Other | Admitting: Family Medicine

## 2021-11-20 VITALS — BP 130/94 | HR 107 | Ht 69.0 in | Wt 157.5 lb

## 2021-11-20 DIAGNOSIS — Z9989 Dependence on other enabling machines and devices: Secondary | ICD-10-CM | POA: Diagnosis not present

## 2021-11-20 DIAGNOSIS — G4733 Obstructive sleep apnea (adult) (pediatric): Secondary | ICD-10-CM | POA: Diagnosis not present

## 2021-11-20 DIAGNOSIS — G6181 Chronic inflammatory demyelinating polyneuritis: Secondary | ICD-10-CM | POA: Diagnosis not present

## 2021-11-20 DIAGNOSIS — Z79899 Other long term (current) drug therapy: Secondary | ICD-10-CM

## 2021-11-20 NOTE — Telephone Encounter (Signed)
PA Case: [id number], Status: Approved, Coverage Starts on: 11/20/2021 12:00:00 AM, Coverage Ends on: 05/19/2022 12:00:00 AM.

## 2021-11-20 NOTE — Telephone Encounter (Signed)
Submitted PA tramadol on CMM. Key: [id number]. Waiting on determination from [facility].

## 2021-11-20 NOTE — Patient Instructions (Addendum)
Please continue using your CPAP regularly. While your insurance requires that you use CPAP at least 4 hours each night on 70% of the nights, I recommend, that you not skip any nights and use it throughout the night if you can. Getting used to CPAP and staying with the treatment long term does take time and patience and discipline. Untreated obstructive sleep apnea when it is moderate to severe can have an adverse impact on cardiovascular health and raise her risk for heart disease, arrhythmias, hypertension, congestive heart failure, stroke and diabetes. Untreated obstructive sleep apnea causes sleep disruption, nonrestorative sleep, and sleep deprivation. This can have an impact on your day to day functioning and cause daytime sleepiness and impairment of cognitive function, memory loss, mood disturbance, and problems focussing. Using CPAP regularly can improve these symptoms.   Continue current treatment plan.   Follow up with Sater in 4 months

## 2022-03-06 ENCOUNTER — Ambulatory Visit: Payer: Self-pay | Admitting: *Deleted

## 2022-03-06 NOTE — Telephone Encounter (Signed)
  Chief Complaint: Mother is calling with concerns about her son's behavior Symptoms: angry, drinks alcohol, isolating Frequency: getting worse- "toxic" Pertinent Negatives: Patient denies violent behavior at this time Disposition: [] ED /[] Urgent Care (no appt availability in office) / [] Appointment(In office/virtual)/ []  [facility]/ [] Home Care/ [] Refused Recommended Disposition /[] [facility]/ [x]  Follow-up with PCP Additional Notes: Advised needs PCP follow up, therapy, family therapy- support. Mother given resources to help her as well. Advised 911 for violent behavior.   Reason for Disposition  Requesting to talk with a mental health counselor about substance abuse  Answer Assessment - Initial Assessment Questions 1. DRUG: "What drug are you concerned about?"     Alcohol abuse 2. SYMPTOMS: "Does your son have any symptoms that could be due to drug abuse?" If so, ask: "What are they?"     Isolation, anger 3. ONSET: "When did these symptoms begin?"     4 years ago 4. RECURRENT SYMPTOM: "Has your son had the symptoms before?" if so, ask: "When was the last time?" "What happened that time?"     Hard to handle 5. DANGER: " Does your son have thoughts of hurting himself or others?" "Does he have those thoughts/feelings now?"     Unsure- he has anger 6. THERAPIST: "Does your son have a counselor or therapist? Name?"     no 7. MENTAL STATUS: "Does he know who he is, who you are and where he is?"     Yes- alert 8. TEEN'S APPEARANCE: "How does your seen look?" "What is he doing right now?"     Does participate in ADL  Protocols used: Substance Use or Problems-P-AH

## 2022-03-06 NOTE — Telephone Encounter (Signed)
Patient has multiple medical issues. He is deaf, is drinking alcohol, he has angry- mother is having a hard time handling living with the patient. Patient can be very scary and the confrontational. Mother is in a toxic situation with her son. Jackelyn Poling is having trouble living with her son- the relationship has deteriorated. Advised mother to reach out to resources - primary care, [facility], [place] [facility] for advise and help- her son needs support and counciling to help him  deal with his issues. Mother has been advised to call 911 for any physical violence.

## 2022-03-06 NOTE — Telephone Encounter (Signed)
Left a voicemail for Atul Delucia, mother (on [place]) to return the call and any nurse could assist her.

## 2022-03-06 NOTE — Telephone Encounter (Signed)
Message from Erick Blinks sent at 03/05/2022  6:07 PM EDT  Summary: Pt's mother concerned about patient's behavior   Pt has recently lost his hearing, he has been living deaf for 4 years. Pt's mother is concerned about his behavior. Says he has been isolating himself a lot more recently.  Best contact: [phone]           Call History   Type Contact Phone/Fax User  03/05/2022 06:04 PM EDT Phone (Incoming) Holik,Debbie (Mother) [phone]

## 2022-03-06 NOTE — Telephone Encounter (Signed)
Second attempt to return call.   Left voicemail same message as before.

## 2022-03-11 ENCOUNTER — Encounter: Payer: Self-pay | Admitting: Neurology

## 2022-03-11 ENCOUNTER — Ambulatory Visit (INDEPENDENT_AMBULATORY_CARE_PROVIDER_SITE_OTHER): Payer: Medicare Other | Admitting: Neurology

## 2022-03-11 VITALS — BP 99/65 | HR 111 | Ht 69.0 in | Wt 144.0 lb

## 2022-03-11 DIAGNOSIS — G6181 Chronic inflammatory demyelinating polyneuritis: Secondary | ICD-10-CM | POA: Diagnosis not present

## 2022-03-11 DIAGNOSIS — G47419 Narcolepsy without cataplexy: Secondary | ICD-10-CM

## 2022-03-11 DIAGNOSIS — Z79899 Other long term (current) drug therapy: Secondary | ICD-10-CM

## 2022-03-11 DIAGNOSIS — G4733 Obstructive sleep apnea (adult) (pediatric): Secondary | ICD-10-CM

## 2022-03-11 DIAGNOSIS — R269 Unspecified abnormalities of gait and mobility: Secondary | ICD-10-CM

## 2022-03-11 DIAGNOSIS — R296 Repeated falls: Secondary | ICD-10-CM

## 2022-03-11 DIAGNOSIS — G629 Polyneuropathy, unspecified: Secondary | ICD-10-CM | POA: Diagnosis not present

## 2022-03-11 DIAGNOSIS — G8929 Other chronic pain: Secondary | ICD-10-CM

## 2022-03-11 MED ORDER — NORTRIPTYLINE HCL 25 MG PO CAPS: 25.0000 mg | ORAL_CAPSULE | Freq: Every day | ORAL | 11 refills | Status: DC

## 2022-03-11 MED ORDER — AMPHETAMINE-DEXTROAMPHET ER 20 MG PO CP24: 20.0000 mg | ORAL_CAPSULE | Freq: Every day | ORAL | 0 refills | Status: DC

## 2022-03-11 NOTE — Progress Notes (Signed)
[facility]  PATIENT: Tommy Ellison DOB: September 06, 1975  REFERRING DOCTOR OR PCP: Aura Dials, MD SOURCE: Patient, notes from primary care  _________________________________   HISTORICAL  CHIEF COMPLAINT:  Chief Complaint  Patient presents with   Follow-up    Pt in room #2 and alone. Pt here today for f/u OSA on CPAP.    HISTORY OF PRESENT ILLNESS:  Tommy Ellison is a 46 year old man with deafness and polyneuropathy since 2020.      Update 03/11/2022  HST 08/13/2021 showed moderate OSA with AHI 24.8/hr, REM AHI 33.7/hr and O2 nadir 76%. AutoPAP ordered. D/L today shows poor compliance 3% 4hrs, 33% any use and sub-optimal AHI = 11     He has been sick with some shortness of breath as well as reduced appetite.  He has not used CPAP much the last couple months.  When he did not have nausea, he felt better using CPAP.   He has dreams of animals attacking him.   He takes naps x 2 for 1 hour each.   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     He feels the numbness, pain and balance continue to worsen   He wakes up in severe pain at times.      He has back pain with an aching quality.  He also has dysesthetic pain in the feet with both a cold sensation and pins and needle sensation.   He continues IVIg and felt there was some benefit initially.     Prednisone had not helped and we stopped it.   He has lost the extra weight he gained.     He has not done the [facility] CMT panel yet and he will call them back.      He will be getting a cochlear implant soon.  P.o. discussed about his symptoms.  His mother accompanied him today and I reviewed the studies with her as well. ;   History of neuropathy:  He had generally been in good health until 2020.  He became deaf over a few days in July 2020. However hearing had declined some in preceding month (mildly muffled).   He also became more ubbalanced that month and fell  12/03/2020.     He has had ED since 2020 as well.  Dysesthesias and numbness began around September or October 2020.  Balance worsened around that time as well and he had a fall at that time.  He feels symptoms have progressively worsened since the onset.   He notes mild weakness in his legs but not in his hands.      He notes no bladder changes.  He has a history of moderate alcohol abuse but drinks much less over the past couple of years.      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    CSF did not show elevated protein or any other abnormality.   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    Gabapentin helps slightly and current dose is 600 mg 3 times daily.  Labs 11/02/2019.  SPEP/IEF showed a polyclonal increase in IgM.  SSA/SSB, cryoglobulins and thiamine were normal.   Anti-mag IgM was negative.  Hepatitis labs were negative.  I reviewed lab work from primary care.  B12 and TSH were normal.  He has no family history of polyneuropathy that he is aware of.  REVIEW OF SYSTEMS: Constitutional: No fevers, chills, sweats, or change in appetite Eyes: No visual changes, double vision, eye pain Ear, nose and throat: No hearing loss, ear pain, nasal congestion, sore throat Cardiovascular: No chest pain, palpitations Respiratory:  No shortness of breath at rest or with exertion.   No wheezes GastrointestinaI: No nausea, vomiting, diarrhea, abdominal pain, fecal incontinence Genitourinary:  No dysuria, urinary retention or frequency.  No nocturia. Musculoskeletal:  No neck pain, back pain Integumentary: No rash, pruritus, skin lesions Neurological: as above Psychiatric: No depression at this time.  No anxiety Endocrine: No palpitations,  diaphoresis, change in appetite, change in weigh or increased thirst Hematologic/Lymphatic:  No anemia, purpura, petechiae. Allergic/Immunologic: No itchy/runny eyes, nasal congestion, recent allergic reactions, rashes  ALLERGIES: No Known Allergies  HOME MEDICATIONS:  Current Outpatient Medications:    acetaminophen (TYLENOL) 500 MG tablet, Take 500 mg by mouth every 6 (six) hours as needed., Disp: , Rfl:    amphetamine-dextroamphetamine (ADDERALL XR) 20 MG 24 hr capsule, Take 1 capsule (20 mg total) by mouth daily., Disp: 30 capsule, Rfl: 0   Cholecalciferol (VITAMIN D3 PO), Take 5,000 Units by mouth daily., Disp: , Rfl:    citalopram (CELEXA) 20 MG tablet, Take 1 tablet (20 mg total) by mouth daily., Disp: 90 tablet, Rfl: 3   Cyanocobalamin (VITAMIN B-12 PO), Take 1,000 mcg by mouth daily., Disp: , Rfl:    etodolac (LODINE) 400 MG tablet, TAKE 1 TABLET (400 MG TOTAL) BY MOUTH 2 (TWO) TIMES DAILY. SCHEDULE FOLLOW UP FOR FUTURE REFILLS, Disp: 60 tablet, Rfl: 5   Ferrous Sulfate (IRON PO), Take 65 mg by mouth 2 (two) times daily., Disp: , Rfl:    ferrous sulfate 325 (65 FE) MG tablet, Take 325 mg by mouth 2 (two) times daily with a meal., Disp: , Rfl:    FOLIC ACID PO, Take 800 mcg by mouth daily., Disp: , Rfl:    gabapentin (NEURONTIN) 600 MG tablet, TAKE 1 TABLET BY MOUTH THREE TIMES A DAY, Disp: 90 tablet, Rfl: 5   Multiple Vitamin (MULTIVITAMIN) tablet, Take 1 tablet by mouth daily., Disp: , Rfl:    nortriptyline (PAMELOR) 25 MG capsule, Take 1 capsule (25 mg total) by mouth at bedtime., Disp: 30 capsule, Rfl: 11   pantoprazole (PROTONIX) 40 MG tablet, Take 40 mg by mouth daily., Disp: , Rfl:    sildenafil (VIAGRA) 50 MG tablet, Take 1 tablet (50 mg total) by mouth daily as needed for erectile dysfunction. Up to 10 pills/month, Disp: 10 tablet, Rfl: 3   Thiamine HCl (VITAMIN B-1 PO), Take 500 mg by mouth daily., Disp: , Rfl:    traMADol (ULTRAM) 50 MG tablet, Take 1 tablet (50 mg total)  by mouth every 8 (eight) hours as needed., Disp: 90 tablet, Rfl: 0  PAST MEDICAL HISTORY: Past Medical History:  Diagnosis Date   Anxiety    Hypersomnia    Rhinitis     PAST SURGICAL HISTORY: Past Surgical History:  Procedure Laterality Date   TOE SURGERY     WISDOM TOOTH EXTRACTION      FAMILY HISTORY: Family History  Problem Relation Age of Onset   Arthritis Other    Depression Mother     SOCIAL HISTORY:  Social History   Socioeconomic History   Marital status: Divorced    Spouse name: Not  on file   Number of children: Not on file   Years of education: Not on file   Highest education level: Not on file  Occupational History   Not on file  Tobacco Use   Smoking status: Never   Smokeless tobacco: Never  Substance and Sexual Activity   Alcohol use: Yes    Comment: daily    Drug use: No   Sexual activity: Not on file  Other Topics Concern   Not on file  Social History Narrative   Right handed   2 cokes per day   Social Determinants of Health   Financial Resource Strain: Not on file  Food Insecurity: Not on file  Transportation Needs: Not on file  Physical Activity: Not on file  Stress: Not on file  Social Connections: Not on file  Intimate Partner Violence: Not on file     PHYSICAL EXAM  Vitals:   03/11/22 1145  BP: 99/65  Pulse: (!) 111  Weight: 144 lb (65.3 kg)  Height: 5\' 9"  (1.753 m)     Body mass index is 21.27 kg/m.   General: The patient is well-developed and well-nourished and in no acute distress  HEENT:  Head is [place]/AT.  Sclera are anicteric.   Neck/musculoskeletal: The neck is nontender with good range of motion.  He has some tenderness over the lumbar spine and paraspinal muscles  Skin: Extremities are without rash or  edema.  Neurologic Exam  Mental status: The patient is alert and oriented x 3 at the time of the examination. The patient has apparent normal recent and remote memory, with an apparently normal attention  span and concentration ability.   Speech is normal  Cranial nerves: Extraocular movements are full. Pupils are small and not definitely reactive.  Facial strength and sensation was normal.  Trapezius and sternocleidomastoid strength is normal. No dysarthria is noted.   He is deaf  Motor:  Muscle bulk is normal.   Tone is normal. Strength is  5 / 5 in all 4 extremities except 4+/5 in the intrinsic foot muscles and EHL muscles.   Sensory: He has mildly reduced vibration sensation (75%) in the knees and fingertips relative to the proximal arms.  He has only 20 to 25% sensation at the ankles and negligible vibration sensation at the toes.  There is reduced pinprick sensation below the knee worse in the feet  Coordination: Cerebellar testing reveals good finger-nose-finger and heel-to-shin bilaterally.  Gait and station: Station is normal.   Gait is wide.. Tandem gait is poor.  Romberg is borderline..   Reflexes: Deep tendon reflexes are symmetric and normal in the arms but absent in the legs.   .      ASSESSMENT AND PLAN  Polyneuropathy - Plan: PR ORTHOPEDIC MENS SHOES DPTH I  OSA on CPAP  High risk medication use  CIDP (chronic inflammatory demyelinating polyneuropathy) (HCC)  OSA (obstructive sleep apnea)  Falls frequently  Other chronic pain  Gait disturbance  1.   Unknown etiology of neuropathy.  CSF showed normal protein but EMG/NCV is most consistent with CIDP and he feels there was some benefit on IVIG.  I cannot rule out a genetic etiology.  To further evaluate this, we will check an [employer] panel for hereditary sensorimotor polyneuropathy (CMT variants).    2.  He has symptoms consistent with narcolepsy.  Besides daytime sleepiness, he has hypnagogic hallucinations, sleep paralysis and has had 1 or 2 episodes that could be cataplexy.  I will place  him on nortriptyline and Adderall. 3.    He will continue gabapentin.  Hopefully the nortriptyline will also help with the  dysesthetic pain some. 4.   If gait continues to worsen we may need to have him get a electric wheelchair for safety in the home and to improve activities of daily living.   5.  Return to see [place] in 4 months or sooner if there are new or worsening neurologic symptoms or based on the results of the tests  44-minute office visit with the majority of the time spent face-to-face for history and physical, discussion/counseling and decision-making.  Additional time with record review and documentation.   Lovey Crupi A. Epimenio Foot, MD, [facility] 03/11/2022, 5:56 PM Certified in Neurology, Clinical Neurophysiology, Sleep Medicine and Neuroimaging  [facility] [address] [place], [place] [address] [phone]

## 2022-03-16 ENCOUNTER — Telehealth: Payer: Self-pay | Admitting: Neurology

## 2022-03-16 NOTE — Telephone Encounter (Signed)
Completed form for [facility]. Faxed and received confirmation that it went through.

## 2022-03-17 ENCOUNTER — Other Ambulatory Visit: Payer: Self-pay | Admitting: Neurology

## 2022-03-26 ENCOUNTER — Telehealth: Payer: Self-pay | Admitting: Neurology

## 2022-03-26 NOTE — Telephone Encounter (Signed)
Patient mom called in stating that the patient has starting drinking alcohol from the time he wakes up and time he goes to bed. He has isolated himself from others. He gets  easily angered. He was continuing to get sinus infections and she asked Dr Sheryn Bison about whether this could be caused by him snorting the adderall/medication and they said it could. She states this is what he does sometimes. I asked about the tramadol that was ordered and she was unsure about that. She just wanted to make sure that it was noted that there has been behavioral changes going on with the patient and that he is drinking a lot. He turned his CPAP in.

## 2022-03-31 ENCOUNTER — Other Ambulatory Visit: Payer: Self-pay

## 2022-03-31 MED ORDER — ETODOLAC 400 MG PO TABS: ORAL_TABLET | ORAL | 5 refills | Status: DC

## 2022-04-02 ENCOUNTER — Other Ambulatory Visit: Payer: Self-pay | Admitting: Neurology

## 2022-04-06 ENCOUNTER — Other Ambulatory Visit: Payer: Self-pay | Admitting: Neurology

## 2022-04-06 MED ORDER — AMPHETAMINE-DEXTROAMPHET ER 20 MG PO CP24: 20.0000 mg | ORAL_CAPSULE | Freq: Every day | ORAL | 0 refills | Status: DC

## 2022-04-06 NOTE — Telephone Encounter (Signed)
Pt last seen 03/11/22 and next f/u 07/15/22. Per drug registry, last refilled 03/11/22 #30.

## 2022-04-06 NOTE — Telephone Encounter (Signed)
Pt request refill for amphetamine-dextroamphetamine (ADDERALL XR) 20 MG 24 hr capsule  at  [facility]

## 2022-04-29 ENCOUNTER — Other Ambulatory Visit: Payer: Self-pay | Admitting: Neurology

## 2022-04-29 ENCOUNTER — Ambulatory Visit: Payer: Medicaid Other | Admitting: Internal Medicine

## 2022-04-29 MED ORDER — AMPHETAMINE-DEXTROAMPHET ER 20 MG PO CP24: 20.0000 mg | ORAL_CAPSULE | Freq: Every day | ORAL | 0 refills | Status: DC

## 2022-04-29 NOTE — Addendum Note (Signed)
Addended by: Arther Abbott on: 04/29/2022 04:52 PM   Modules accepted: Orders

## 2022-04-29 NOTE — Telephone Encounter (Signed)
Per drug registry, last refilled 04/07/22 #30. Last seen 03/11/22 and next f/u 07/15/22.

## 2022-04-29 NOTE — Telephone Encounter (Signed)
Pt is calling. Requesting refill on medication amphetamine-dextroamphetamine (ADDERALL XR) 20 MG 24 hr capsule. Refill should be sent to  [facility]

## 2022-04-30 ENCOUNTER — Other Ambulatory Visit: Payer: Self-pay | Admitting: Neurology

## 2022-05-07 ENCOUNTER — Telehealth: Payer: Self-pay | Admitting: *Deleted

## 2022-05-07 NOTE — Telephone Encounter (Signed)
Spoke w/ Kim,RN. Pt coming Tues/Wed for infusion. Starting weight 08/22 was 161lb. As of 03/11/22, now 144lb. Per [facility], would like to reduce his dose from 70Gx2days q 4wks to 65Gx2days q 4 weeks. Relayed this to Kim,RN/intrafusion.

## 2022-06-02 ENCOUNTER — Other Ambulatory Visit: Payer: Self-pay | Admitting: Neurology

## 2022-06-03 MED ORDER — AMPHETAMINE-DEXTROAMPHET ER 20 MG PO CP24: 20.0000 mg | ORAL_CAPSULE | Freq: Every day | ORAL | 0 refills | Status: DC

## 2022-07-02 ENCOUNTER — Other Ambulatory Visit: Payer: Self-pay | Admitting: Neurology

## 2022-07-02 MED ORDER — AMPHETAMINE-DEXTROAMPHET ER 20 MG PO CP24: 20.0000 mg | ORAL_CAPSULE | Freq: Every day | ORAL | 0 refills | Status: DC

## 2022-07-14 ENCOUNTER — Telehealth: Payer: Self-pay | Admitting: *Deleted

## 2022-07-14 NOTE — Telephone Encounter (Signed)
Called patient and informed him to bring his Cpap machine visit on 07/14/22

## 2022-07-15 ENCOUNTER — Ambulatory Visit (INDEPENDENT_AMBULATORY_CARE_PROVIDER_SITE_OTHER): Payer: Medicare Other | Admitting: Neurology

## 2022-07-15 ENCOUNTER — Encounter: Payer: Self-pay | Admitting: Neurology

## 2022-07-15 VITALS — BP 118/81 | HR 134 | Ht 69.0 in | Wt 145.0 lb

## 2022-07-15 DIAGNOSIS — G6181 Chronic inflammatory demyelinating polyneuritis: Secondary | ICD-10-CM | POA: Diagnosis not present

## 2022-07-15 DIAGNOSIS — G4733 Obstructive sleep apnea (adult) (pediatric): Secondary | ICD-10-CM

## 2022-07-15 DIAGNOSIS — R1310 Dysphagia, unspecified: Secondary | ICD-10-CM | POA: Diagnosis not present

## 2022-07-15 DIAGNOSIS — R269 Unspecified abnormalities of gait and mobility: Secondary | ICD-10-CM

## 2022-07-15 DIAGNOSIS — G47419 Narcolepsy without cataplexy: Secondary | ICD-10-CM

## 2022-07-15 DIAGNOSIS — K219 Gastro-esophageal reflux disease without esophagitis: Secondary | ICD-10-CM | POA: Diagnosis not present

## 2022-07-15 DIAGNOSIS — G629 Polyneuropathy, unspecified: Secondary | ICD-10-CM | POA: Diagnosis not present

## 2022-07-15 MED ORDER — ONDANSETRON HCL 8 MG PO TABS: 8.0000 mg | ORAL_TABLET | Freq: Three times a day (TID) | ORAL | 3 refills | Status: AC | PRN

## 2022-07-15 MED ORDER — NORTRIPTYLINE HCL 25 MG PO CAPS: 50.0000 mg | ORAL_CAPSULE | Freq: Every day | ORAL | 4 refills | Status: DC

## 2022-07-15 MED ORDER — SILDENAFIL CITRATE 50 MG PO TABS: 50.0000 mg | ORAL_TABLET | Freq: Every day | ORAL | 11 refills | Status: DC | PRN

## 2022-07-15 MED ORDER — LAMOTRIGINE 25 MG PO TABS: ORAL_TABLET | ORAL | 11 refills | Status: DC

## 2022-07-15 NOTE — Progress Notes (Addendum)
[facility]  PATIENT: RAMIL FOIL DOB: [birth year]  REFERRING DOCTOR OR PCP: Aura Dials, MD SOURCE: Patient, notes from primary care  _________________________________   HISTORICAL  CHIEF COMPLAINT:  Chief Complaint  Patient presents with   Follow-up    Pt in room #2 and alone. Pt here today for f/u OSA on CPAP.    HISTORY OF PRESENT ILLNESS:  Pearlie Yoshikawa is a 47 year old man with deafness and polyneuropathy since 2020.      Updat 07/15/2022 He is continuing IVIG for the CIDP.  His doses 130 g (65 g x 2 days) every 4 weeks.  He generally feels better for 3 weeks after each IVIG but then the 4th week he is more numb, more off balanced and has much more pain.  He also has dysesthetic pain in the feet with both a cold sensation and pins and needle sensation.   He continues IVIg and felt there was some benefit initially.   Before the IVIG we had tried prednisone without benefit.  He is moting more pain, especially te last week.    E notes the back is more inflamed and more foot /knee pain.   Pain is worse when he wals.     He takes gabapentin 3 times a day with some benefit  TRmadol just helped a little bit.     OSA/narcolepsy:   HST 08/13/2021 showed moderate OSA with AHI 24.8/hr, REM AHI 33.7/hr and O2 nadir 76%. AutoPAP ordered. D/L today shows poor compliance 3% 4hrs, 33% any use and sub-optimal AHI = 11     He has been sick with some shortness of breath as well as reduced appetite.  He has not used CPAP much the last couple months.  When he did not have nausea, he felt better using CPAP.   He has dreams of animals attacking him.   He takes naps x 2 for 1 hour each.   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     He will be getting a cochlear implant soon. ;   History of neuropathy:  He had generally been in good health until 2020.  He became deaf over a few days in July 2020. However hearing had  declined some in preceding month (mildly muffled).   He also became more ubbalanced that month and fell 12/03/2020.     He has had ED since 2020 as well.  Dysesthesias and numbness began around September or October 2020.  Balance worsened around that time as well and he had a fall at that time.  He feels symptoms have progressively worsened since the onset.   He notes mild weakness in his legs but not in his hands.      He notes no bladder changes.  He has a history of moderate alcohol abuse but drinks much less over the past couple of years.      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    CSF did not show elevated protein or any other abnormality.   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    Gabapentin helps slightly and current dose is 600 mg 3 times daily.  Labs 11/02/2019.  SPEP/IEF showed a polyclonal increase in IgM.  SSA/SSB, cryoglobulins  and thiamine were normal.   Anti-mag IgM was negative.  Hepatitis labs were negative.      I reviewed lab work from primary care.  B12 and TSH were normal.  He has no family history of polyneuropathy that he is aware of.  REVIEW OF SYSTEMS: Constitutional: No fevers, chills, sweats, or change in appetite Eyes: No visual changes, double vision, eye pain it looks like the medicine is only $9 for 30 pills with good Rx it looks like the medicine is only $9 at [facility] with good Rx it if there is a Medicare appeal or preauthorization we can do that. Ear, nose and throat: No hearing loss, ear pain, nasal congestion, sore throat Cardiovascular: No chest pain, palpitations Respiratory:  No shortness of breath at rest or with exertion.   No wheezes GastrointestinaI: No nausea, vomiting, diarrhea, abdominal pain,  fecal incontinence Genitourinary:  No dysuria, urinary retention or frequency.  No nocturia. Musculoskeletal:  No neck pain, back pain Integumentary: No rash, pruritus, skin lesions Neurological: as above Psychiatric: No depression at this time.  No anxiety Endocrine: No palpitations, diaphoresis, change in appetite, change in weigh or increased thirst Hematologic/Lymphatic:  No anemia, purpura, petechiae. Allergic/Immunologic: No itchy/runny eyes, nasal congestion, recent allergic reactions, rashes  ALLERGIES: No Known Allergies  HOME MEDICATIONS:  Current Outpatient Medications:    acetaminophen (TYLENOL) 500 MG tablet, Take 500 mg by mouth every 6 (six) hours as needed., Disp: , Rfl:    amphetamine-dextroamphetamine (ADDERALL XR) 20 MG 24 hr capsule, Take 1 capsule (20 mg total) by mouth daily., Disp: 30 capsule, Rfl: 0   Cholecalciferol (VITAMIN D3 PO), Take 5,000 Units by mouth daily., Disp: , Rfl:    citalopram (CELEXA) 20 MG tablet, Take 1 tablet (20 mg total) by mouth daily., Disp: 90 tablet, Rfl: 3   Cyanocobalamin (VITAMIN B-12 PO), Take 1,000 mcg by mouth daily., Disp: , Rfl:    etodolac (LODINE) 400 MG tablet, TAKE 1 TABLET (400 MG TOTAL) BY MOUTH 2 (TWO) TIMES DAILY. SCHEDULE FOLLOW UP FOR FUTURE REFILLS, Disp: 60 tablet, Rfl: 5   Ferrous Sulfate (IRON PO), Take 65 mg by mouth 2 (two) times daily., Disp: , Rfl:    ferrous sulfate 325 (65 FE) MG tablet, Take 325 mg by mouth 2 (two) times daily with a meal., Disp: , Rfl:    FOLIC ACID PO, Take Q000111Q mcg by mouth daily., Disp: , Rfl:    gabapentin (NEURONTIN) 600 MG tablet, TAKE 1 TABLET BY MOUTH THREE TIMES A DAY, Disp: 90 tablet, Rfl: 5   Multiple Vitamin (MULTIVITAMIN) tablet, Take 1 tablet by mouth daily., Disp: , Rfl:    nortriptyline (PAMELOR) 25 MG capsule, Take 1 capsule (25 mg total) by mouth at bedtime., Disp: 30 capsule, Rfl: 11   pantoprazole (PROTONIX) 40 MG tablet, Take 40 mg by mouth daily., Disp: , Rfl:     sildenafil (VIAGRA) 50 MG tablet, Take 1 tablet (50 mg total) by mouth daily as needed for erectile dysfunction. Up to 10 pills/month, Disp: 10 tablet, Rfl: 3   Thiamine HCl (VITAMIN B-1 PO), Take 500 mg by mouth daily., Disp: , Rfl:    traMADol (ULTRAM) 50 MG tablet, Take 1 tablet (50 mg total) by mouth every 8 (eight) hours as needed., Disp: 90 tablet, Rfl: 0  PAST MEDICAL HISTORY: Past Medical History:  Diagnosis Date   Anxiety    Hypersomnia    Rhinitis     PAST SURGICAL HISTORY: Past Surgical History:  Procedure Laterality  Date   TOE SURGERY     WISDOM TOOTH EXTRACTION      FAMILY HISTORY: Family History  Problem Relation Age of Onset   Arthritis Other    Depression Mother     SOCIAL HISTORY:  Social History   Socioeconomic History   Marital status: Divorced    Spouse name: Not on file   Number of children: Not on file   Years of education: Not on file   Highest education level: Not on file  Occupational History   Not on file  Tobacco Use   Smoking status: Never   Smokeless tobacco: Never  Substance and Sexual Activity   Alcohol use: Yes    Comment: daily    Drug use: No   Sexual activity: Not on file  Other Topics Concern   Not on file  Social History Narrative   Right handed   2 cokes per day   Social Determinants of Health   Financial Resource Strain: Not on file  Food Insecurity: Not on file  Transportation Needs: Not on file  Physical Activity: Not on file  Stress: Not on file  Social Connections: Not on file  Intimate Partner Violence: Not on file     PHYSICAL EXAM  Vitals:   03/11/22 1145  BP: 99/65  Pulse: (!) 111  Weight: 144 lb (65.3 kg)  Height: '5\' 9"'$  (1.753 m)     Body mass index is 21.27 kg/m.   General: The patient is well-developed and well-nourished and in no acute distress  HEENT:  Head is [place]/AT.  Sclera are anicteric.   Neck/musculoskeletal: The neck is nontender with good range of motion.  He has some  tenderness over the lumbar spine and paraspinal muscles  Skin: Extremities are without rash or  edema.  Neurologic Exam  Mental status: The patient is alert and oriented x 3 at the time of the examination. The patient has apparent normal recent and remote memory, with an apparently normal attention span and concentration ability.   Speech is normal  Cranial nerves: Extraocular movements are full. Pupils are small and not definitely reactive.  Facial strength and sensation was normal.  Trapezius and sternocleidomastoid strength is normal. No dysarthria is noted.   He is deaf  Motor:  Muscle bulk is normal.   Tone is normal. Strength is  5 / 5 in all 4 extremities except 4+/5 in the intrinsic foot muscles and EHL muscles.   Sensory: He has mildly reduced vibration sensation (75%) in the knees and fingertips relative to the proximal arms.  He has only 20 to 25% sensation at the ankles and < 10% vibration sensation at the toes.  There is reduced pinprick sensation below the knee worse in the feet  Coordination: Cerebellar testing reveals good finger-nose-finger and heel-to-shin bilaterally.  Gait and station: Station is normal.   Gait is wide.. Tandem gait is poor.  Romberg is borderline..   Reflexes: Deep tendon reflexes are symmetric and normal in the arms but absent in the legs.   .      ASSESSMENT AND PLAN  Polyneuropathy - Plan: PR ORTHOPEDIC MENS SHOES DPTH I  OSA on CPAP  High risk medication use  CIDP (chronic inflammatory demyelinating polyneuropathy) (HCC)  OSA (obstructive sleep apnea)  Falls frequently  Other chronic pain  Gait disturbance  1.   He has polyneuropathy.  CSF showed normal protein but EMG/NCV is most consistent with CIDP.  He had benefit from IVIG..  I cannot rule  out a genetic etiology.   2.  He has symptoms consistent with narcolepsy.  Nortriptyline and Adderall have helped the sleepiness and the hypnagogic hallucinations. 3.    He will continue  gabapentin.  I will add lamotrigine and increase the nortriptyline to try to help the dysesthetic pain better. 4.  Prescription for rollator. 5.  Shoes for polyneuropathy 6.   Return to see [place] in 4 months or sooner if there are new or worsening neurologic symptoms or based on the results of the tests  42-minute office visit with the majority of the time spent face-to-face for history and physical, discussion/counseling and decision-making.  Additional time with record review and documentation.   Alexzander Dolinger A. Felecia Shelling, MD, [facility] [id number], [id number] PM Certified in Neurology, Clinical Neurophysiology, Sleep Medicine and Neuroimaging  [facility] [address] [place], [place] [address] [phone]

## 2022-07-16 ENCOUNTER — Telehealth: Payer: Self-pay | Admitting: Neurology

## 2022-07-16 NOTE — Telephone Encounter (Signed)
Patient is at the clinic for IVIG infusion, already finished 65 g on February 21, this is his second dose of 2 consecutive same dose infusion,  He has been getting 2 g/kg IVIG divided into 2 days every 4 weeks since 2023,  usually tolerated well  He took [place] today, walk with a cane, hard of hearing,  Was noted by infusion nurse and confirmed by me to have tachycardia, resting heart rate 120-130, he also complains of shortness of breath, I did not hear cracking sound at the base of his lung  He is on low dose metoprolol, no significant cardiac history per patient  But with his significant resting tachycardia, another load of IVIG will increase volume load, decided to cancel today's infusion  Will leave decision to Dr. Felecia Shelling when he comes back to the office

## 2022-07-16 NOTE — Telephone Encounter (Signed)
Printed and gave to intrafusion to get pt r/s for infusion next week per Dr. Felecia Shelling request

## 2022-07-21 ENCOUNTER — Telehealth: Payer: Self-pay | Admitting: Neurology

## 2022-07-21 ENCOUNTER — Other Ambulatory Visit: Payer: Self-pay | Admitting: Neurology

## 2022-07-21 DIAGNOSIS — R29898 Other symptoms and signs involving the musculoskeletal system: Secondary | ICD-10-CM

## 2022-07-21 DIAGNOSIS — R200 Anesthesia of skin: Secondary | ICD-10-CM

## 2022-07-21 DIAGNOSIS — G629 Polyneuropathy, unspecified: Secondary | ICD-10-CM

## 2022-07-21 NOTE — Telephone Encounter (Signed)
Pt was in infusion suite at time of call, infusion nurse Kim relayed message to him.

## 2022-07-21 NOTE — Telephone Encounter (Signed)
Dr. Felecia Shelling- you saw pt 07/15/22. I do not see mention of providing rx for this? Did you discuss?

## 2022-07-21 NOTE — Telephone Encounter (Signed)
Danielle not on DPR is calling about pt special shoes. Asking if nurse can please fax to  [employer]  office notes, prescription for special shoes and insurance please fax to [phone] Att: Megan P: [phone]. Please call and let pt know this is done.

## 2022-07-21 NOTE — Telephone Encounter (Signed)
Faxed rx to fax # below. Received fax confirmation.   Please call pt and let him know prescription for shoes has been faxed as requested.

## 2022-07-30 ENCOUNTER — Other Ambulatory Visit: Payer: Self-pay | Admitting: Neurology

## 2022-07-30 MED ORDER — AMPHETAMINE-DEXTROAMPHET ER 20 MG PO CP24: 20.0000 mg | ORAL_CAPSULE | Freq: Every day | ORAL | 0 refills | Status: DC

## 2022-08-11 ENCOUNTER — Other Ambulatory Visit: Payer: Self-pay | Admitting: Neurology

## 2022-08-11 MED ORDER — AMPHETAMINE-DEXTROAMPHET ER 20 MG PO CP24
20.0000 mg | ORAL_CAPSULE | Freq: Every day | ORAL | 0 refills | Status: DC
  Filled 2022-08-11: qty 30, 30d supply, fill #0

## 2022-08-11 NOTE — Telephone Encounter (Signed)
Pt friend called and requested amphetamine-dextroamphetamine (ADDERALL XR) 20 MG 24 hr capsule be sent to [facility]. She is requesting Prescription be sent today bc he have been out of medication for a week.

## 2022-08-11 NOTE — Telephone Encounter (Signed)
Dr.Sater   I called and spoke with pt friend Andee Poles ( has DPR access) she reports [facility] not [facility]r 20 mg  in stock. The [facility] in [place] does. She asked if Rx could be sent there. I did call [facility] to confirm that Rx was not picked up on 07/30/22 and has not, Rx has not been in stock per pharmacy.    Rx is pending to be signed to [facility] last picked up at [facility] on 07/02/22 # 30 tablets

## 2022-08-12 ENCOUNTER — Other Ambulatory Visit (HOSPITAL_BASED_OUTPATIENT_CLINIC_OR_DEPARTMENT_OTHER): Payer: Self-pay

## 2022-09-04 ENCOUNTER — Other Ambulatory Visit: Payer: Self-pay | Admitting: Neurology

## 2022-09-07 ENCOUNTER — Other Ambulatory Visit (HOSPITAL_BASED_OUTPATIENT_CLINIC_OR_DEPARTMENT_OTHER): Payer: Self-pay

## 2022-09-07 MED ORDER — AMPHETAMINE-DEXTROAMPHET ER 20 MG PO CP24
20.0000 mg | ORAL_CAPSULE | Freq: Every day | ORAL | 0 refills | Status: DC
  Filled 2022-09-07 – 2022-09-09 (×2): qty 30, 30d supply, fill #0

## 2022-09-08 ENCOUNTER — Telehealth: Payer: Self-pay | Admitting: Neurology

## 2022-09-08 ENCOUNTER — Other Ambulatory Visit: Payer: Self-pay | Admitting: Neurology

## 2022-09-08 MED ORDER — SILDENAFIL CITRATE 100 MG PO TABS: 50.0000 mg | ORAL_TABLET | Freq: Every day | ORAL | 11 refills | Status: DC | PRN

## 2022-09-08 NOTE — Telephone Encounter (Signed)
Reviewed pt chart. Dr. Epimenio Foot did send in increased dose of nortriptyline on 07/15/22 to the pharmacy. He went from 1 at bed to 2 at bed.

## 2022-09-08 NOTE — Telephone Encounter (Signed)
Dr. Epimenio Foot- I see where you sent in refill of Viagra but I dont see mention of increasing the dose. Were you planning to adjust this dose as well?

## 2022-09-08 NOTE — Telephone Encounter (Signed)
Pt's caretaker called stating that the  nortriptyline (PAMELOR) 25 MG capsule and the sildenafil (VIAGRA) 50 MG tablet were supposed to be doubled in MG according to what was discussed in the last appt. but when they went to pick them up at the pharmacy it was still at the old MG, so they were told at the pharmacy to just double the ones picked up but now the pt is running out of the medication and are being told that a new Rx needs to be sent in. Pt is needing a new Rx for both medications with the new MG [facility]

## 2022-09-09 ENCOUNTER — Other Ambulatory Visit: Payer: Self-pay

## 2022-09-09 ENCOUNTER — Other Ambulatory Visit (HOSPITAL_BASED_OUTPATIENT_CLINIC_OR_DEPARTMENT_OTHER): Payer: Self-pay

## 2022-09-09 NOTE — Telephone Encounter (Signed)
Phone room: please call back. Relay below message about nortriptyline rx? Dr. Epimenio Foot sent in increased dose of Viagra for  yesterday.

## 2022-09-09 NOTE — Telephone Encounter (Signed)
Pt caregiver called. Requesting nortriptyline (PAMELOR) 25 MG capsule  be sent to the pharmacy. Stated one prescription was sent but not the other one. She is requesting a call back one prescription is sent.

## 2022-09-09 NOTE — Telephone Encounter (Signed)
Called pt caretaker back. Stated she will call the pharmacy back and talk to them again. She said thanks for calling me back.

## 2022-10-07 ENCOUNTER — Other Ambulatory Visit: Payer: Self-pay

## 2022-10-07 ENCOUNTER — Other Ambulatory Visit: Payer: Self-pay | Admitting: Neurology

## 2022-10-08 ENCOUNTER — Telehealth: Payer: Self-pay | Admitting: Neurology

## 2022-10-08 MED ORDER — PANTOPRAZOLE SODIUM 40 MG PO TBEC: 40.0000 mg | DELAYED_RELEASE_TABLET | Freq: Every day | ORAL | 1 refills | Status: DC

## 2022-10-08 MED ORDER — METOPROLOL SUCCINATE ER 25 MG PO TB24: 25.0000 mg | ORAL_TABLET | Freq: Every day | ORAL | 1 refills | Status: DC

## 2022-10-08 MED ORDER — CITALOPRAM HYDROBROMIDE 20 MG PO TABS: 20.0000 mg | ORAL_TABLET | Freq: Every day | ORAL | 1 refills | Status: AC

## 2022-10-08 NOTE — Telephone Encounter (Signed)
Pt's PCP has retired and now he has no one to fill his medication.

## 2022-10-08 NOTE — Telephone Encounter (Signed)
Please call back. These medications should be prescribed and managed by a primary care doctor. They should request from PCP

## 2022-10-08 NOTE — Telephone Encounter (Signed)
Dr. Epimenio Foot- are you willing to provide temporary refills until he establishes with new PCP?  Or are you willing to take over?

## 2022-10-08 NOTE — Telephone Encounter (Signed)
Pt's friend Tommy Ellison called stating that the pt would like to know how he can get some of his medications transferred to his provider here. Pt would like his provider here to continue to fill his Celexa 20mg , Protonix 40mg  and his Metoprolol 25mg  from now on even though he is not the original prescriber. Pt's original prescriber has retired. Please advise.

## 2022-10-08 NOTE — Telephone Encounter (Signed)
Called Danielle back. Relayed Dr. Bonnita Hollow message. She verbalized understanding. They will work on getting him established with new PCP in the meantime. Confirmed he takes the meds as follows:  Celexa 20mg : directions: Take 1 po qd Protonix 40mg : directions: Take 1 po qd Metoprolol 25mg  ER: directions: Take 1 po qd  [id number] [place], [place] - [address] I e-scribed refills to pharmacy.

## 2022-10-09 ENCOUNTER — Other Ambulatory Visit (HOSPITAL_BASED_OUTPATIENT_CLINIC_OR_DEPARTMENT_OTHER): Payer: Self-pay

## 2022-10-09 ENCOUNTER — Other Ambulatory Visit: Payer: Self-pay | Admitting: Neurology

## 2022-10-12 ENCOUNTER — Other Ambulatory Visit (HOSPITAL_BASED_OUTPATIENT_CLINIC_OR_DEPARTMENT_OTHER): Payer: Self-pay

## 2022-10-12 MED ORDER — AMPHETAMINE-DEXTROAMPHET ER 20 MG PO CP24
20.0000 mg | ORAL_CAPSULE | Freq: Every day | ORAL | 0 refills | Status: DC
  Filled 2022-10-12: qty 30, 30d supply, fill #0

## 2022-10-12 NOTE — Telephone Encounter (Signed)
Pt last seen on 06/25/22  Follow up scheduled on 01/13/23 Rx last filled on 09/10/22 #30 tablets (30 day supply) Rx pending to be signed

## 2022-10-14 ENCOUNTER — Other Ambulatory Visit (HOSPITAL_BASED_OUTPATIENT_CLINIC_OR_DEPARTMENT_OTHER): Payer: Self-pay

## 2022-10-16 ENCOUNTER — Other Ambulatory Visit (HOSPITAL_BASED_OUTPATIENT_CLINIC_OR_DEPARTMENT_OTHER): Payer: Self-pay

## 2022-10-20 MED ORDER — AMPHETAMINE-DEXTROAMPHET ER 20 MG PO CP24: 20.0000 mg | ORAL_CAPSULE | Freq: Every day | ORAL | 0 refills | Status: DC

## 2022-10-20 NOTE — Telephone Encounter (Signed)
I spoke with Dr. Epimenio Foot about the below and was given verbal okay to send the request to him to send to [facility].  Rx pending to be signed.

## 2022-10-20 NOTE — Telephone Encounter (Addendum)
Pt friend called. Stated she needs to talk to nurse about amphetamine-dextroamphetamine (ADDERALL XR) 20 MG 24 hr capsule. Stated pt got a new brand from a new pharmacy and he had a bad reaction. Stated [facility] and they have the old brand that pt is used to taking. She is requesting a call from nurse to discuss.

## 2022-10-20 NOTE — Telephone Encounter (Signed)
Dr.Sater I called to follow up the below. I called and spoke with Duwayne Heck (pt friend) she reports that pt took 3 days of Adderall XR from [facility] and was like a "zombie" he slept all day. Duwayne Heck said it took a long time for patient to wake up. [facility] however they ran out of Adderall XR at that pharmacy so that's why they started [facility], [facility] is currently using a different manufacture then before ( he has picked up this Rx there before)    He is scheduled for his infusion tomorrow at 10:30 and said she can bring the pills here to show that they have 27 pills left and that he is not abusing medication. The [facility]  currently has the previous manufacture of Adderall XR she wanted to know if you are willing to send a new Rx to [facility]?

## 2022-10-20 NOTE — Addendum Note (Signed)
Addended by: Aura Camps on: 10/20/2022 01:48 PM   Modules accepted: Orders

## 2022-11-02 ENCOUNTER — Other Ambulatory Visit: Payer: Self-pay | Admitting: *Deleted

## 2022-11-02 DIAGNOSIS — G6181 Chronic inflammatory demyelinating polyneuritis: Secondary | ICD-10-CM

## 2022-11-02 NOTE — Progress Notes (Signed)
[facility] reached out. Pt needs updated IgG, IgA, IgM (last set 2022).  Spoke w/ Dr. Epimenio Foot. He also wants to add CBC w/ diff/platelet. I placed future labs orders.   Per Tobi Bastos: "We called his friend and asked if she could drive him"  Pt will come this week to complete labs.

## 2022-11-10 ENCOUNTER — Other Ambulatory Visit: Payer: Medicare Other

## 2022-11-10 DIAGNOSIS — G6181 Chronic inflammatory demyelinating polyneuritis: Secondary | ICD-10-CM

## 2022-11-11 ENCOUNTER — Telehealth: Payer: Self-pay | Admitting: *Deleted

## 2022-11-11 LAB — CBC WITH DIFFERENTIAL/PLATELET
Basophils Absolute: 0 10*3/uL (ref 0.0–0.2)
Basos: 1 %
EOS (ABSOLUTE): 0.1 10*3/uL (ref 0.0–0.4)
Eos: 1 %
Hematocrit: 38 % (ref 37.5–51.0)
Hemoglobin: 13.4 g/dL (ref 13.0–17.7)
Immature Grans (Abs): 0 10*3/uL (ref 0.0–0.1)
Immature Granulocytes: 0 %
Lymphocytes Absolute: 1 10*3/uL (ref 0.7–3.1)
Lymphs: 15 %
MCH: 38.1 pg — ABNORMAL HIGH (ref 26.6–33.0)
MCHC: 35.3 g/dL (ref 31.5–35.7)
MCV: 108 fL — ABNORMAL HIGH (ref 79–97)
Monocytes Absolute: 1 10*3/uL — ABNORMAL HIGH (ref 0.1–0.9)
Monocytes: 15 %
Neutrophils Absolute: 4.6 10*3/uL (ref 1.4–7.0)
Neutrophils: 68 %
Platelets: 295 10*3/uL (ref 150–450)
RBC: 3.52 x10E6/uL — ABNORMAL LOW (ref 4.14–5.80)
RDW: 11.2 % — ABNORMAL LOW (ref 11.6–15.4)
WBC: 6.8 10*3/uL (ref 3.4–10.8)

## 2022-11-11 LAB — IGG, IGA, IGM
IgA/Immunoglobulin A, Serum: 238 mg/dL (ref 90–386)
IgG (Immunoglobin G), Serum: 2177 mg/dL — ABNORMAL HIGH (ref 603–1613)
IgM (Immunoglobulin M), Srm: 265 mg/dL — ABNORMAL HIGH (ref 20–172)

## 2022-11-11 MED ORDER — ETODOLAC 400 MG PO TABS: ORAL_TABLET | ORAL | 5 refills | Status: DC

## 2022-11-11 NOTE — Telephone Encounter (Signed)
Pt here yesterday for IVIG infusion. Requesting refill on etodolac. I spoke w/ Dr. Epimenio Foot who approved refill. I e-scribed to pharmacy for pt.

## 2022-11-16 ENCOUNTER — Other Ambulatory Visit (HOSPITAL_BASED_OUTPATIENT_CLINIC_OR_DEPARTMENT_OTHER): Payer: Self-pay

## 2022-11-17 ENCOUNTER — Encounter: Payer: Self-pay | Admitting: Neurology

## 2022-11-17 ENCOUNTER — Other Ambulatory Visit (HOSPITAL_BASED_OUTPATIENT_CLINIC_OR_DEPARTMENT_OTHER): Payer: Self-pay

## 2022-11-17 ENCOUNTER — Other Ambulatory Visit: Payer: Self-pay | Admitting: *Deleted

## 2022-11-17 MED ORDER — AMPHETAMINE-DEXTROAMPHET ER 20 MG PO CP24
20.0000 mg | ORAL_CAPSULE | Freq: Every day | ORAL | 0 refills | Status: DC
  Filled 2022-11-17 (×2): qty 3, 3d supply, fill #0

## 2022-11-17 NOTE — Telephone Encounter (Signed)
Pt sent mychart message: "Good Morning Dr Epimenio Foot. Last month I had an issue with my adderall. The pill looked totally different than what I am use to, turns it was a different manufacture/brand. I took it like normal but I had a strange reaction. It made me extremely sleepy. I could not wake up or function well. I did not put it together until taking the medicine for a few days. I stopped taking it & could definitely tell the pills were making me extremely groggy. To the point of making me even more unstable on my feet then I already am. I reached out to the pharmacy to try & figure out what was different. I also found out there was nothing I could for another month since it is a controlled substances. The rx was able to be refilled yesterday but after calling several pharmacies I can not find the brand I know I do not have an issue with. I asked if I could get only 3 doses of another brand to see if it would work & not have a bad reaction. They are willing to do that but need you to send a Rx for only the 3 pills. This way if I can not tolerate it I didn't waste all 30 & have to wait again another month. If the brand works I can let you so we can fill the other 27 or try different one. The pharmacist suggested this route as well. If you can send the RX to the [facility] on [address], I believe it is the most recent place you sent the last adderall rx. Thanks for your help! "    Pt last refilled 10/13/22 #30. Last seen 07/15/22 and next f/u 01/13/23.

## 2022-11-18 ENCOUNTER — Other Ambulatory Visit: Payer: Self-pay | Admitting: *Deleted

## 2022-11-18 ENCOUNTER — Encounter: Payer: Self-pay | Admitting: Neurology

## 2022-11-18 ENCOUNTER — Other Ambulatory Visit (HOSPITAL_BASED_OUTPATIENT_CLINIC_OR_DEPARTMENT_OTHER): Payer: Self-pay

## 2022-11-19 ENCOUNTER — Other Ambulatory Visit (HOSPITAL_COMMUNITY): Payer: Self-pay

## 2022-11-19 MED ORDER — AMPHETAMINE-DEXTROAMPHET ER 20 MG PO CP24
20.0000 mg | ORAL_CAPSULE | Freq: Every day | ORAL | 0 refills | Status: DC
  Filled 2022-11-19 (×2): qty 27, 27d supply, fill #0

## 2022-11-19 NOTE — Addendum Note (Signed)
Addended by: Arther Abbott on: 11/19/2022 07:11 AM   Modules accepted: Orders

## 2022-11-20 ENCOUNTER — Other Ambulatory Visit (HOSPITAL_COMMUNITY): Payer: Self-pay

## 2022-12-07 NOTE — Telephone Encounter (Signed)
Last seen on 07/15/22 Last filled on 11/11/22 #60 tablets (30 day supply)

## 2022-12-14 ENCOUNTER — Other Ambulatory Visit: Payer: Self-pay | Admitting: Neurology

## 2022-12-14 ENCOUNTER — Other Ambulatory Visit (HOSPITAL_COMMUNITY): Payer: Self-pay

## 2022-12-15 ENCOUNTER — Other Ambulatory Visit (HOSPITAL_COMMUNITY): Payer: Self-pay

## 2022-12-15 MED ORDER — AMPHETAMINE-DEXTROAMPHET ER 20 MG PO CP24
20.0000 mg | ORAL_CAPSULE | Freq: Every day | ORAL | 0 refills | Status: DC
  Filled 2022-12-15: qty 27, 27d supply, fill #0

## 2022-12-16 ENCOUNTER — Other Ambulatory Visit (HOSPITAL_COMMUNITY): Payer: Self-pay

## 2022-12-16 ENCOUNTER — Other Ambulatory Visit: Payer: Self-pay

## 2023-01-13 ENCOUNTER — Encounter: Payer: Self-pay | Admitting: Neurology

## 2023-01-13 ENCOUNTER — Ambulatory Visit (INDEPENDENT_AMBULATORY_CARE_PROVIDER_SITE_OTHER): Payer: Medicare Other | Admitting: Neurology

## 2023-01-13 VITALS — BP 163/118 | HR 71 | Ht 68.0 in | Wt 182.0 lb

## 2023-01-13 DIAGNOSIS — R269 Unspecified abnormalities of gait and mobility: Secondary | ICD-10-CM | POA: Diagnosis not present

## 2023-01-13 DIAGNOSIS — R200 Anesthesia of skin: Secondary | ICD-10-CM

## 2023-01-13 DIAGNOSIS — R29898 Other symptoms and signs involving the musculoskeletal system: Secondary | ICD-10-CM | POA: Diagnosis not present

## 2023-01-13 DIAGNOSIS — G4733 Obstructive sleep apnea (adult) (pediatric): Secondary | ICD-10-CM

## 2023-01-13 DIAGNOSIS — N521 Erectile dysfunction due to diseases classified elsewhere: Secondary | ICD-10-CM

## 2023-01-13 DIAGNOSIS — G47419 Narcolepsy without cataplexy: Secondary | ICD-10-CM

## 2023-01-13 DIAGNOSIS — G6181 Chronic inflammatory demyelinating polyneuritis: Secondary | ICD-10-CM

## 2023-01-13 DIAGNOSIS — M21379 Foot drop, unspecified foot: Secondary | ICD-10-CM

## 2023-01-13 MED ORDER — TADALAFIL 20 MG PO TABS: 20.0000 mg | ORAL_TABLET | Freq: Every day | ORAL | 5 refills | Status: AC | PRN

## 2023-01-13 MED ORDER — LAMOTRIGINE 100 MG PO TABS: ORAL_TABLET | ORAL | 5 refills | Status: DC

## 2023-01-13 NOTE — Progress Notes (Addendum)
[facility]  PATIENT: Tommy Ellison DOB: 20-Oct-1975  REFERRING DOCTOR OR PCP: Henrine Screws, MD SOURCE: Patient, notes from primary care  _________________________________   HISTORICAL  CHIEF COMPLAINT:  Chief Complaint  Patient presents with   Follow-up    Pt in room 11, wife in room. Here for polyneuropathy follow up. Pt said his legs are better, pt said pain in feet are worsen, pt said feet strength has gotten worsen in feet, pt said he can't move his toes without help.     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      Updat 01/13/2023 He notes that strength is doing better.   He feels the hands are doing better as far as sensation but feet seem worse.  The foot pain is doing worse as well.      He is receiving IVIG for the CIDP.  His dose is  130 g (65 g x 2 days) every 4 weeks.  He generally feels better for 3 weeks after each IVIG but then the 4th week he is more numb, more off balanced and has much more pain.  He also has dysesthetic pain in the feet with both a cold sensation and pins and needle sensation.   Before the IVIG we had tried prednisone without benefit.  He takes gabapentin 3 times a day with some benefit  Tramadol just helped a little bit.     OSA/narcolepsy:   HST 08/13/2021 showed moderate OSA with AHI 24.8/hr, REM AHI 33.7/hr and O2 nadir 76%. AutoPAP ordered. But poor compliance.     APAP upset his stomach and he swallowed a lot of air.   .  When he did not have nausea, he felt better using CPAP.   He has dreams of animals attacking him.   He takes naps x 2 for 1 hour each.   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     He will be getting a cochlear implant soon. ;   History of neuropathy:  He had generally been in good health until 2020.  He became deaf over a few days in July 2020. However hearing had declined some  in preceding month (mildly muffled).   He also became more ubbalanced that month and fell 12/03/2020.     He has had ED since 2020 as well.  Dysesthesias and numbness began around September or October 2020.  Balance worsened around that time as well and he had a fall at that time.  He feels symptoms have progressively worsened since the onset.   He notes mild weakness in his legs but not in his hands.      He notes no bladder changes.  He has a history of moderate alcohol abuse but drinks much less over the past couple of years.      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    CSF did not show elevated protein or any other abnormality.   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      Labs 11/02/2019.  SPEP/IEF showed a polyclonal increase in IgM.  SSA/SSB, cryoglobulins and thiamine were normal.   Anti-mag IgM was negative.  Hepatitis  labs were negative.      I reviewed lab work from primary care.  B12 and TSH were normal.  He has no family history of polyneuropathy that he is aware of.  REVIEW OF SYSTEMS: Constitutional: No fevers, chills, sweats, or change in appetite Eyes: No visual changes, double vision, eye pain it looks like the medicine is only $9 for 30 pills with good Rx it looks like the medicine is only $9 at [facility] with good Rx it if there is a Medicare appeal or preauthorization we can do that. Ear, nose and throat: No hearing loss, ear pain, nasal congestion, sore throat Cardiovascular: No chest pain, palpitations Respiratory:  No shortness of breath at rest or with exertion.   No wheezes GastrointestinaI: No nausea, vomiting, diarrhea, abdominal pain, fecal incontinence Genitourinary:  No dysuria, urinary retention or frequency.  No  nocturia. Musculoskeletal:  No neck pain, back pain Integumentary: No rash, pruritus, skin lesions Neurological: as above Psychiatric: No depression at this time.  No anxiety Endocrine: No palpitations, diaphoresis, change in appetite, change in weigh or increased thirst Hematologic/Lymphatic:  No anemia, purpura, petechiae. Allergic/Immunologic: No itchy/runny eyes, nasal congestion, recent allergic reactions, rashes  ALLERGIES: No Known Allergies  HOME MEDICATIONS:  Current Outpatient Medications:    acetaminophen (TYLENOL) 500 MG tablet, Take 500 mg by mouth every 6 (six) hours as needed., Disp: , Rfl:    amphetamine-dextroamphetamine (ADDERALL XR) 20 MG 24 hr capsule, Take 1 capsule (20 mg total) by mouth daily., Disp: 27 capsule, Rfl: 0   Cholecalciferol (VITAMIN D3 PO), Take 5,000 Units by mouth daily., Disp: , Rfl:    citalopram (CELEXA) 20 MG tablet, Take 1 tablet (20 mg total) by mouth daily., Disp: 90 tablet, Rfl: 1   Cyanocobalamin (VITAMIN B-12 PO), Take 1,000 mcg by mouth daily., Disp: , Rfl:    etodolac (LODINE) 400 MG tablet, TAKE 1 TABLET (400 MG TOTAL) BY MOUTH 2 (TWO) TIMES DAILY. SCHEDULE FOLLOW UP FOR FUTURE REFILLS, Disp: 60 tablet, Rfl: 5   ferrous sulfate 325 (65 FE) MG tablet, Take 325 mg by mouth 2 (two) times daily with a meal., Disp: , Rfl:    FOLIC ACID PO, Take 800 mcg by mouth daily., Disp: , Rfl:    gabapentin (NEURONTIN) 600 MG tablet, TAKE 1 TABLET BY MOUTH THREE TIMES A DAY, Disp: 90 tablet, Rfl: 3   metoprolol succinate (TOPROL-XL) 25 MG 24 hr tablet, Take 1 tablet (25 mg total) by mouth daily., Disp: 90 tablet, Rfl: 1   Multiple Vitamin (MULTIVITAMIN) tablet, Take 1 tablet by mouth daily., Disp: , Rfl:    nortriptyline (PAMELOR) 25 MG capsule, Take 2 capsules (50 mg total) by mouth at bedtime., Disp: 180 capsule, Rfl: 4   ondansetron (ZOFRAN) 8 MG tablet, Take 1 tablet (8 mg total) by mouth every 8 (eight) hours as needed for nausea or vomiting., Disp:  20 tablet, Rfl: 3   pantoprazole (PROTONIX) 40 MG tablet, Take 1 tablet (40 mg total) by mouth daily., Disp: 90 tablet, Rfl: 1   tadalafil (CIALIS) 20 MG tablet, Take 1 tablet (20 mg total) by mouth daily as needed for erectile dysfunction., Disp: 12 tablet, Rfl: 5   Thiamine HCl (VITAMIN B-1 PO), Take 500 mg by mouth daily., Disp: , Rfl:    Ferrous Sulfate (IRON PO), Take 65 mg by mouth 2 (two) times daily. (Patient not taking: Reported on 01/13/2023), Disp: , Rfl:    lamoTRIgine (LAMICTAL) 100 MG tablet, One po bid, Disp: 60  tablet, Rfl: 5  PAST MEDICAL HISTORY: Past Medical History:  Diagnosis Date   Anxiety    Hypersomnia    Rhinitis     PAST SURGICAL HISTORY: Past Surgical History:  Procedure Laterality Date   TOE SURGERY     WISDOM TOOTH EXTRACTION      FAMILY HISTORY: Family History  Problem Relation Age of Onset   Arthritis Other    Depression Mother     SOCIAL HISTORY:  Social History   Socioeconomic History   Marital status: Divorced    Spouse name: Not on file   Number of children: Not on file   Years of education: Not on file   Highest education level: Not on file  Occupational History   Not on file  Tobacco Use   Smoking status: Never   Smokeless tobacco: Never  Substance and Sexual Activity   Alcohol use: Yes    Comment: daily    Drug use: No   Sexual activity: Not on file  Other Topics Concern   Not on file  Social History Narrative   Right handed   2 cokes per day   Social Determinants of Health   Financial Resource Strain: Not on file  Food Insecurity: Not on file  Transportation Needs: Not on file  Physical Activity: Not on file  Stress: Not on file  Social Connections: Not on file  Intimate Partner Violence: Not on file     PHYSICAL EXAM  Vitals:   01/13/23 1353 01/13/23 1405  BP: (!) 155/110 (!) 163/118  Pulse: 67 71  Weight: 182 lb (82.6 kg)   Height: 5\' 8"  (1.727 m)      Body mass index is 27.67 kg/m.   General:  The patient is well-developed and well-nourished and in no acute distress  HEENT:  Head is [place]/AT.  Sclera are anicteric.   Neck/musculoskeletal: The neck is nontender with good range of motion.  He has some tenderness over the lumbar spine and paraspinal muscles  Skin: Extremities are without rash or  edema.  Neurologic Exam  Mental status: The patient is alert and oriented x 3 at the time of the examination. The patient has apparent normal recent and remote memory, with an apparently normal attention span and concentration ability.   Speech is normal  Cranial nerves: Extraocular movements are full. Pupils are small and not definitely reactive.  Facial strength and sensation was normal.  Trapezius and sternocleidomastoid strength is normal. No dysarthria is noted.   He is deaf  Motor:  Muscle bulk is normal.   Tone is normal. Strength is  5 / 5 in all 4 extremities except 4/5 in the anke and toe extensors.   .   Sensory: He has normal vibration sensation  in the knees and fingertips relative to the proximal arms.  He has only 20 to 25% sensation at the ankles and < 10% vibration sensation at the toes.  There is reduced pinprick sensation below the knee worse in the feet  Coordination: Cerebellar testing reveals good finger-nose-finger and heel-to-shin bilaterally.  Gait and station: Station is normal.   Gait is wide.. Cannot tandem walk.  .  Romberg is borderline..   Reflexes: Deep tendon reflexes are symmetric and normal in the arms but absent in the legs.   .      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   He has polyneuropathy.  CSF showed normal protein but EMG/NCV is most consistent with CIDP.  He had benefit from IVIG.. We also discussed a trial of Vyvgart.      2.  He has symptoms consistent with narcolepsy.  Nortriptyline and Adderall have helped the sleepiness and the hypnagogic hallucinations. 3.    He will continue gabapentin.  Increase lamotrigine to 100 mg po bid 4.  Prescription for rollator. 5.  Shoes for polyneuropathy.  He has a foot drop and will benefit from a right AFO to prevent falls and improve ambulation. 6.   Consider evaluation for Inspire device for his Return to see [place] in 4 months or sooner if there are new or worsening neurologic symptoms or based on the results of the tests  40-minute office visit with the majority of the time spent face-to-face for history and physical, discussion/counseling and decision-making.  Additional time with record review and documentation.  This visit is part of a comprehensive longitudinal care medical relationship regarding the patients primary diagnosis of CIDP and related concerns.   Marston Mccadden A. Epimenio Foot, MD, [facility] 01/13/2023, 2:51 PM Certified in Neurology, Clinical Neurophysiology, Sleep Medicine and Neuroimaging  [facility] [address], [address] [place], [place] [address] [phone]

## 2023-01-14 ENCOUNTER — Telehealth: Payer: Self-pay | Admitting: Neurology

## 2023-01-14 NOTE — Telephone Encounter (Signed)
 Referral for ENT to see Dr. Jenne Pane fax to [facility]. Phone: [phone], Fax: [phone]

## 2023-01-18 ENCOUNTER — Other Ambulatory Visit: Payer: Self-pay | Admitting: *Deleted

## 2023-01-18 ENCOUNTER — Other Ambulatory Visit: Payer: Self-pay

## 2023-01-18 DIAGNOSIS — G6181 Chronic inflammatory demyelinating polyneuritis: Secondary | ICD-10-CM

## 2023-01-19 ENCOUNTER — Telehealth: Payer: Self-pay

## 2023-01-19 ENCOUNTER — Other Ambulatory Visit: Payer: Self-pay

## 2023-01-19 ENCOUNTER — Ambulatory Visit: Payer: Medicare Other | Admitting: Physical Therapy

## 2023-01-19 LAB — RENAL FUNCTION PANEL
Albumin: 4.4 g/dL (ref 4.1–5.1)
BUN/Creatinine Ratio: 7 — ABNORMAL LOW (ref 9–20)
BUN: 4 mg/dL — ABNORMAL LOW (ref 6–24)
CO2: 20 mmol/L (ref 20–29)
Calcium: 8.8 mg/dL (ref 8.7–10.2)
Chloride: 90 mmol/L — ABNORMAL LOW (ref 96–106)
Creatinine, Ser: 0.59 mg/dL — ABNORMAL LOW (ref 0.76–1.27)
Glucose: 86 mg/dL (ref 70–99)
Phosphorus: 4.8 mg/dL — ABNORMAL HIGH (ref 2.8–4.1)
Potassium: 4.4 mmol/L (ref 3.5–5.2)
Sodium: 125 mmol/L — ABNORMAL LOW (ref 134–144)
eGFR: 120 mL/min/{1.73_m2} (ref >59)

## 2023-01-19 MED ORDER — FAMOTIDINE 40 MG PO TABS: 40.0000 mg | ORAL_TABLET | Freq: Every day | ORAL | 3 refills | Status: DC

## 2023-01-19 NOTE — Telephone Encounter (Signed)
Results given and reviewed with wife Duwayne Heck. She verbalized understanding and agree to discontinue to pantoprazole and add famotidine.  Famotidine ordered per Dr. Epimenio Foot and pantoprazole discontinued per Dr. Epimenio Foot.

## 2023-01-19 NOTE — Telephone Encounter (Signed)
-----   Message from Asa Lente sent at 01/19/2023  9:24 AM EDT ----- Please let him know that the sodium was lower than we would like it to be.  If he drinks a lot of water he needs to try to restrict it some.   Also stop the pantoprazole and take famotidine (we can writ for 40 mg) once daily instead

## 2023-01-19 NOTE — Telephone Encounter (Signed)
Call to patient, no answer. Left message to return call to office.

## 2023-01-21 ENCOUNTER — Ambulatory Visit: Payer: Medicare Other | Attending: Neurology

## 2023-01-21 ENCOUNTER — Other Ambulatory Visit: Payer: Self-pay

## 2023-01-21 DIAGNOSIS — R262 Difficulty in walking, not elsewhere classified: Secondary | ICD-10-CM | POA: Diagnosis present

## 2023-01-21 DIAGNOSIS — R2689 Other abnormalities of gait and mobility: Secondary | ICD-10-CM | POA: Insufficient documentation

## 2023-01-21 DIAGNOSIS — R2681 Unsteadiness on feet: Secondary | ICD-10-CM | POA: Insufficient documentation

## 2023-01-21 DIAGNOSIS — R269 Unspecified abnormalities of gait and mobility: Secondary | ICD-10-CM | POA: Diagnosis not present

## 2023-01-21 DIAGNOSIS — R29898 Other symptoms and signs involving the musculoskeletal system: Secondary | ICD-10-CM | POA: Diagnosis not present

## 2023-01-21 DIAGNOSIS — M6281 Muscle weakness (generalized): Secondary | ICD-10-CM | POA: Diagnosis present

## 2023-01-21 DIAGNOSIS — G6181 Chronic inflammatory demyelinating polyneuritis: Secondary | ICD-10-CM | POA: Diagnosis not present

## 2023-01-21 NOTE — Therapy (Signed)
OUTPATIENT PHYSICAL THERAPY NEURO EVALUATION   Patient Name: Tommy Ellison MRN: [id number] DOB:1975-09-08, 47 y.o., male Today's Date: 01/21/2023   PCP: Eustaquio Boyden, DO REFERRING PROVIDER: Asa Lente, MD  END OF SESSION:  PT End of Session - 01/21/23 0801     Visit Number 1    Number of Visits 6    Date for PT Re-Evaluation 03/04/23    Authorization Type Medicare/Medicaid    Progress Note Due on Visit 10    PT Start Time 0800    PT Stop Time 0845    PT Time Calculation (min) 45 min             Past Medical History:  Diagnosis Date   Anxiety    Hypersomnia    Rhinitis    Past Surgical History:  Procedure Laterality Date   TOE SURGERY     WISDOM TOOTH EXTRACTION     Patient Active Problem List   Diagnosis Date Noted   OSA (obstructive sleep apnea) 08/18/2021   Daytime sleepiness 08/18/2021   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 11/04/2020   Leg weakness, bilateral 11/04/2020   High total serum IgM 11/06/2019   Numbness 11/02/2019   Polyneuropathy 11/02/2019   Bronchitis, acute 06/23/2011   Seasonal and perennial allergic rhinitis 03/20/2008   Hypersomnia 03/19/2007    ONSET DATE: 2021  REFERRING DIAG: G61.81 (ICD-10-CM) - CIDP (chronic inflammatory demyelinating polyneuropathy) (HCC) R29.898 (ICD-10-CM) - Leg weakness, bilateral R26.9 (ICD-10-CM) - Gait disorder  THERAPY DIAG:  Muscle weakness (generalized)  Unsteadiness on feet  Difficulty in walking, not elsewhere classified  Other abnormalities of gait and mobility  Rationale for Evaluation and Treatment: Rehabilitation  SUBJECTIVE:                                                                                                                                                                                             SUBJECTIVE STATEMENT: Onset of CIDP since 2020. 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  Bilateral foot and hand weakness.  Right hand dominant.  Has cochlear implant on right side. Reports he tries to walk for physical activity and stretching. Reports IVIG tx 2x/month Pt accompanied by: friend-Danielle  PERTINENT HISTORY: 77 year old man with deafness and polyneuropathy since 2020. He had generally been in good health until 2020. He became deaf over a few days in July 2020. However hearing had declined some in preceding month (mildly muffled). He also became more ubbalanced that month and fell 12/03/2020. He has had ED since 2020 as well. Dysesthesias and numbness began around September or October 2020.  Balance worsened around that time as well and he had a fall at that time. He feels symptoms have progressively worsened since the onset. He is receiving IVIG for the CIDP.  PAIN:  Are you having pain? Yes: NPRS scale: 3/10 Pain location: BLE and LBP Pain description: tight Aggravating factors: feet worse in the AM Relieving factors: movement  PRECAUTIONS: Fall  RED FLAGS: None, reports some change to bladder  WEIGHT BEARING RESTRICTIONS: No  FALLS: Has patient fallen in last 6 months? Yes. Number of falls 1-2/month Denies any injuries.  LIVING ENVIRONMENT: Lives with: lives alone Lives in: House/apartment Stairs: No Has following equipment at home: Single point cane and Environmental consultant - 4 wheeled  PLOF: Independent, uses shower chair, grab bars  PATIENT GOALS: prevent atrophy  OBJECTIVE:   DIAGNOSTIC FINDINGS: n/a for current episode  COGNITION: Overall cognitive status: Within functional limits for tasks assessed   SENSATION: Impaired mid-shin to feet  COORDINATION: Alternating coordination limited by weakness  EDEMA:  none  MUSCLE TONE: hypotonic  MUSCLE LENGTH: Hamstrings: Right -5 deg; Left -5 deg     POSTURE: No Significant postural limitations  LOWER EXTREMITY ROM:     Active  Right Eval Left Eval  Hip flexion    Hip extension    Hip abduction    Hip adduction    Hip internal  rotation    Hip external rotation    Knee flexion 120 120  Knee extension -5 -5  Ankle dorsiflexion 5 5  Ankle plantarflexion    Ankle inversion    Ankle eversion     (Blank rows = not tested)  Able to achieve plantigrade in PROM  LOWER EXTREMITY MMT:    MMT Right Eval Left Eval  Hip flexion 3 3  Hip extension    Hip abduction 3 3  Hip adduction 3 3  Hip internal rotation    Hip external rotation    Knee flexion 3 3  Knee extension 3 3  Ankle dorsiflexion 2+ 2+  Ankle plantarflexion 2 2  Ankle inversion    Ankle eversion    (Blank rows = not tested)  BED MOBILITY:  Indep  TRANSFERS: Assistive device utilized: None  Sit to stand: Complete Independence and Modified independence Stand to sit: Complete Independence and Modified independence Chair to chair: Complete Independence and Modified independence Floor: NT    CURB:  Level of Assistance: Modified independence Assistive device utilized: None Curb Comments:   STAIRS: NT  GAIT: Gait pattern: wide BOS, poor foot clearance- Right, and poor foot clearance- Left Distance walked:  Assistive device utilized: None Level of assistance: Complete Independence and Modified independence Comments:   FUNCTIONAL TESTS:  5 times sit to stand: 38 sec Timed up and go (TUG): 25 sec Berg Balance Scale: 37/56  M-CTSIB  Condition 1: Firm Surface, EO 30 Sec, Moderate Sway  Condition 2: Firm Surface, EC 5 Sec, Moderate and Severe Sway  Condition 3: Foam Surface, EO  Sec,  Sway  Condition 4: Foam Surface, EC  Sec,  Sway     TODAY'S TREATMENT:  DATE: 01/21/23    PATIENT EDUCATION: Education details: assessment details, rationale of PT intervention Person educated: Patient and friend Education method: Explanation and Handouts Education comprehension: verbalized understanding  HOME EXERCISE  PROGRAM: Access Code: [email or web address] URL: [email or web address] Date: 01/21/2023 Prepared by: Shary Decamp  Exercises - Standing Balance in Corner  - 1 x daily - 7 x weekly - 3 sets - 15-30 sec hold - Standing Balance in Corner with Eyes Closed  - 1 x daily - 7 x weekly - 3 sets - 15-30 sec hold - Corner Balance Feet Apart: Eyes Open With Head Turns  - 1 x daily - 7 x weekly - 3 sets - 3 reps  GOALS: Goals reviewed with patient? Yes  SHORT TERM GOALS: Target date: 02/11/2023    Patient will be independent in HEP to improve functional outcomes Baseline: Goal status: INITIAL  2.  Demo improved BLE strength per time of 30 sec 5xSTS Baseline: 38 sec Goal status: INITIAL  3.  Demo improved safety with ambulation and lower risk for falls per time of 20 sec TUG test Baseline: 25 sec Goal status: INITIAL    LONG TERM GOALS: Target date: 03/04/2023    Demo low risk for falls per score 45/56 Berg Balance Test Baseline: 37/56 Goal status: INITIAL  2.  Demo lower risk for falls per time of 15 sec TUG test Baseline:  Goal status: INITIAL  3.  Demo improved BLE strength per time of 25 sec 5xSTS test Baseline:  Goal status: INITIAL   ASSESSMENT:  CLINICAL IMPRESSION: Patient is a 47 y.o. male who was seen today for physical therapy evaluation and treatment for CIDP and related weakness and mobility deficits.  Exhibits generalized weakness and limited functional mobility and high risk for falls per outcome measures.  Pt would benefit from PT services to address deficits and limitations and enact relevant interventions, adaptations, and compensations to improve safety with functional mobility   OBJECTIVE IMPAIRMENTS: Abnormal gait, decreased activity tolerance, decreased balance, decreased endurance, decreased knowledge of use of DME, decreased mobility, difficulty walking, decreased ROM, decreased strength, impaired flexibility, impaired tone, and pain.   ACTIVITY  LIMITATIONS: carrying, lifting, bending, standing, stairs, transfers, reach over head, and locomotion level  PARTICIPATION LIMITATIONS: meal prep, cleaning, shopping, community activity, and exercise routine  PERSONAL FACTORS: Past/current experiences and Time since onset of injury/illness/exacerbation are also affecting patient's functional outcome.   REHAB POTENTIAL: Good  CLINICAL DECISION MAKING: Evolving/moderate complexity  EVALUATION COMPLEXITY: Moderate  PLAN:  PT FREQUENCY: 1x/week  PT DURATION: 6 weeks  PLANNED INTERVENTIONS: Therapeutic exercises, Therapeutic activity, Neuromuscular re-education, Balance training, Gait training, Patient/Family education, Self Care, Joint mobilization, Stair training, Vestibular training, Canalith repositioning, Orthotic/Fit training, DME instructions, Aquatic Therapy, Dry Needling, Electrical stimulation, Spinal mobilization, Cryotherapy, Moist heat, and Manual therapy  PLAN FOR NEXT SESSION: AFO trials, HEP review and additions (stretching)   1:01 PM, 01/21/23 M. Shary Decamp, PT, DPT Physical Therapist- [place] Office Number: [phone]

## 2023-01-28 ENCOUNTER — Encounter: Payer: Self-pay | Admitting: Neurology

## 2023-02-03 ENCOUNTER — Other Ambulatory Visit: Payer: Self-pay | Admitting: Neurology

## 2023-02-03 ENCOUNTER — Ambulatory Visit: Payer: Medicare Other | Attending: Neurology

## 2023-02-03 DIAGNOSIS — R262 Difficulty in walking, not elsewhere classified: Secondary | ICD-10-CM | POA: Insufficient documentation

## 2023-02-03 DIAGNOSIS — R2689 Other abnormalities of gait and mobility: Secondary | ICD-10-CM | POA: Diagnosis present

## 2023-02-03 DIAGNOSIS — R2681 Unsteadiness on feet: Secondary | ICD-10-CM | POA: Insufficient documentation

## 2023-02-03 DIAGNOSIS — M6281 Muscle weakness (generalized): Secondary | ICD-10-CM | POA: Diagnosis present

## 2023-02-03 NOTE — Therapy (Signed)
OUTPATIENT PHYSICAL THERAPY NEURO TREATMENT   Patient Name: Tommy Ellison MRN: [id number] DOB:[birth year], 47 y.o., male Today's Date: 02/03/2023   PCP: Eustaquio Boyden, DO REFERRING PROVIDER: Asa Lente, MD  END OF SESSION:  PT End of Session - 02/03/23 0800     Visit Number 2    Number of Visits 6    Date for PT Re-Evaluation 03/04/23    Authorization Type Medicare/Medicaid    Progress Note Due on Visit 10    PT Start Time 0800    PT Stop Time 0845    PT Time Calculation (min) 45 min             Past Medical History:  Diagnosis Date   Anxiety    Hypersomnia    Rhinitis    Past Surgical History:  Procedure Laterality Date   TOE SURGERY     WISDOM TOOTH EXTRACTION     Patient Active Problem List   Diagnosis Date Noted   OSA (obstructive sleep apnea) 08/18/2021   Daytime sleepiness 08/18/2021   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 11/04/2020   Leg weakness, bilateral 11/04/2020   High total serum IgM 11/06/2019   Numbness 11/02/2019   Polyneuropathy 11/02/2019   Bronchitis, acute 06/23/2011   Seasonal and perennial allergic rhinitis 03/20/2008   Hypersomnia 03/19/2007    ONSET DATE: 2021  REFERRING DIAG: G61.81 (ICD-10-CM) - CIDP (chronic inflammatory demyelinating polyneuropathy) (HCC) R29.898 (ICD-10-CM) - Leg weakness, bilateral R26.9 (ICD-10-CM) - Gait disorder  THERAPY DIAG:  Muscle weakness (generalized)  Unsteadiness on feet  Difficulty in walking, not elsewhere classified  Other abnormalities of gait and mobility  Rationale for Evaluation and Treatment: Rehabilitation  SUBJECTIVE:                                                                                                                                                                                             SUBJECTIVE STATEMENT: "Nothing new, went to apartment gym but they removed the two leg machines" Pt accompanied by: friend-Danielle  PERTINENT HISTORY: 58 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 He had generally been in good health until 2020. He became deaf over a few days in July 2020. However hearing had declined some in preceding month (mildly muffled). He also became more ubbalanced that month and fell 12/03/2020. He has had ED since 2020 as well. Dysesthesias and numbness began around September or October 2020. Balance worsened around that time as well and he had a fall at that time. He feels symptoms have progressively worsened since the onset. He is receiving IVIG for the CIDP.  PAIN:  Are you having pain? Yes: NPRS scale: 3/10 Pain location: BLE and LBP Pain description: tight Aggravating factors: feet worse in the AM Relieving factors: movement  PRECAUTIONS: Fall  RED FLAGS: None, reports some change to bladder  WEIGHT BEARING RESTRICTIONS: No  FALLS: Has patient fallen in last 6 months? Yes. Number of falls 1-2/month Denies any injuries.  LIVING ENVIRONMENT: Lives with: lives alone Lives in: House/apartment Stairs: No Has following equipment at home: Single point cane and Environmental consultant - 4 wheeled  PLOF: Independent, uses shower chair, grab bars  PATIENT GOALS: prevent atrophy  OBJECTIVE:   TODAY'S TREATMENT: 02/03/23 Activity Comments  AFO trials LLE -PLS AFO -ground reaction AFO  Review of LE stretching -demo good methods for gastroc -hamstring stretch: semi-long sit on bed 2x60 sec                PATIENT EDUCATION: Education details: assessment details, rationale of PT intervention Person educated: Patient and friend Education method: Explanation and Handouts Education comprehension: verbalized understanding  HOME EXERCISE PROGRAM: Access Code: [email or web address] URL: [email or web address] Date: 01/21/2023 Prepared by: Shary Decamp  Exercises - Standing Balance in Corner  - 1 x daily - 7 x weekly - 3 sets - 15-30 sec hold - Standing Balance in Corner with Eyes Closed  - 1 x daily - 7 x weekly  - 3 sets - 15-30 sec hold - Corner Balance Feet Apart: Eyes Open With Head Turns  - 1 x daily - 7 x weekly - 3 sets - 3 reps - Seated Table Hamstring Stretch  - 1 x daily - 7 x weekly - 3 sets - 10 reps - 3-5 sec hold  DIAGNOSTIC FINDINGS: n/a for current episode  COGNITION: Overall cognitive status: Within functional limits for tasks assessed   SENSATION: Impaired mid-shin to feet  COORDINATION: Alternating coordination limited by weakness  EDEMA:  none  MUSCLE TONE: hypotonic  MUSCLE LENGTH: Hamstrings: Right -5 deg; Left -5 deg     POSTURE: No Significant postural limitations  LOWER EXTREMITY ROM:     Active  Right Eval Left Eval  Hip flexion    Hip extension    Hip abduction    Hip adduction    Hip internal rotation    Hip external rotation    Knee flexion 120 120  Knee extension -5 -5  Ankle dorsiflexion 5 5  Ankle plantarflexion    Ankle inversion    Ankle eversion     (Blank rows = not tested)  Able to achieve plantigrade in PROM  LOWER EXTREMITY MMT:    MMT Right Eval Left Eval  Hip flexion 3 3  Hip extension    Hip abduction 3 3  Hip adduction 3 3  Hip internal rotation    Hip external rotation    Knee flexion 3 3  Knee extension 3 3  Ankle dorsiflexion 2+ 2+  Ankle plantarflexion 2 2  Ankle inversion    Ankle eversion    (Blank rows = not tested)  BED MOBILITY:  Indep  TRANSFERS: Assistive device utilized: None  Sit to stand: Complete Independence and Modified independence Stand to sit: Complete Independence and Modified independence Chair to chair: Complete Independence and Modified independence Floor: NT    CURB:  Level of Assistance: Modified independence Assistive device utilized: None Curb Comments:   STAIRS: NT  GAIT: Gait pattern: wide BOS, poor foot clearance- Right, and poor foot clearance- Left Distance walked:  Assistive device utilized: None Level of assistance: Complete  Independence and Modified  independence Comments:   FUNCTIONAL TESTS:  5 times sit to stand: 38 sec Timed up and go (TUG): 25 sec Berg Balance Scale: 37/56  M-CTSIB  Condition 1: Firm Surface, EO 30 Sec, Moderate Sway  Condition 2: Firm Surface, EC 5 Sec, Moderate and Severe Sway  Condition 3: Foam Surface, EO  Sec,  Sway  Condition 4: Foam Surface, EC  Sec,  Sway     TODAY'S TREATMENT:                                                                                                                              DATE: 01/21/23      GOALS: Goals reviewed with patient? Yes  SHORT TERM GOALS: Target date: 02/11/2023    Patient will be independent in HEP to improve functional outcomes Baseline: Goal status: INITIAL  2.  Demo improved BLE strength per time of 30 sec 5xSTS Baseline: 38 sec Goal status: INITIAL  3.  Demo improved safety with ambulation and lower risk for falls per time of 20 sec TUG test Baseline: 25 sec Goal status: INITIAL    LONG TERM GOALS: Target date: 03/04/2023    Demo low risk for falls per score 45/56 Berg Balance Test Baseline: 37/56 Goal status: INITIAL  2.  Demo lower risk for falls per time of 15 sec TUG test Baseline:  Goal status: INITIAL  3.  Demo improved BLE strength per time of 25 sec 5xSTS test Baseline:  Goal status: INITIAL   ASSESSMENT:  CLINICAL IMPRESSION: Instructed in use of AFO devices and demo of foot-up devices to impirove dorsiflexion and foot clearance during gait. Best performance obsevred with ground-reaction AFO LLE with reduction in circumduction and improve foot clearance and knee flexion in pre-swing.  Demo good methods for gastroc stretching. Instructed in hamstring stretching to reduce knee flexion contracture. Continued sesssions to progress POC details  OBJECTIVE IMPAIRMENTS: Abnormal gait, decreased activity tolerance, decreased balance, decreased endurance, decreased knowledge of use of DME, decreased mobility, difficulty walking,  decreased ROM, decreased strength, impaired flexibility, impaired tone, and pain.   ACTIVITY LIMITATIONS: carrying, lifting, bending, standing, stairs, transfers, reach over head, and locomotion level  PARTICIPATION LIMITATIONS: meal prep, cleaning, shopping, community activity, and exercise routine  PERSONAL FACTORS: Past/current experiences and Time since onset of injury/illness/exacerbation are also affecting patient's functional outcome.   REHAB POTENTIAL: Good  CLINICAL DECISION MAKING: Evolving/moderate complexity  EVALUATION COMPLEXITY: Moderate  PLAN:  PT FREQUENCY: 1x/week  PT DURATION: 6 weeks  PLANNED INTERVENTIONS: Therapeutic exercises, Therapeutic activity, Neuromuscular re-education, Balance training, Gait training, Patient/Family education, Self Care, Joint mobilization, Stair training, Vestibular training, Canalith repositioning, Orthotic/Fit training, DME instructions, Aquatic Therapy, Dry Needling, Electrical stimulation, Spinal mobilization, Cryotherapy, Moist heat, and Manual therapy  PLAN FOR NEXT SESSION: HEP review and additions (stretching), trekking poles   8:00 AM, 02/03/23 M. Shary Decamp, PT, DPT Physical Therapist- [place] Office Number: [phone]

## 2023-02-04 NOTE — Telephone Encounter (Signed)
Last seen on 01/13/23 Follow up 04/27/23

## 2023-02-09 ENCOUNTER — Encounter: Payer: Self-pay | Admitting: Neurology

## 2023-02-09 ENCOUNTER — Ambulatory Visit: Payer: Medicare Other

## 2023-02-09 MED ORDER — AMPHETAMINE-DEXTROAMPHET ER 20 MG PO CP24: 20.0000 mg | ORAL_CAPSULE | Freq: Every day | ORAL | 0 refills | Status: DC

## 2023-02-09 NOTE — Telephone Encounter (Signed)
Dr.Athar you are work in provider today, Dr.Sater is out of the office. Last seen on 01/13/23 Follow up scheduled on 04/27/23 Last filled on 01/10/23 #30 tablets (30 day supply) Rx pending to be signed

## 2023-02-11 ENCOUNTER — Ambulatory Visit: Payer: Medicare Other

## 2023-02-11 DIAGNOSIS — R2689 Other abnormalities of gait and mobility: Secondary | ICD-10-CM

## 2023-02-11 DIAGNOSIS — M6281 Muscle weakness (generalized): Secondary | ICD-10-CM

## 2023-02-11 DIAGNOSIS — R262 Difficulty in walking, not elsewhere classified: Secondary | ICD-10-CM

## 2023-02-11 DIAGNOSIS — R2681 Unsteadiness on feet: Secondary | ICD-10-CM

## 2023-02-11 NOTE — Therapy (Signed)
OUTPATIENT PHYSICAL THERAPY NEURO TREATMENT   Patient Name: Tommy Ellison MRN: [id number] DOB:April 25, 1976, 47 y.o., male Today's Date: 02/11/2023   PCP: Eustaquio Boyden, DO REFERRING PROVIDER: Asa Lente, MD  END OF SESSION:  PT End of Session - 02/11/23 0801     Visit Number 3    Number of Visits 6    Date for PT Re-Evaluation 03/04/23    Authorization Type Medicare/Medicaid    Progress Note Due on Visit 10    PT Start Time 0800    PT Stop Time 0845    PT Time Calculation (min) 45 min             Past Medical History:  Diagnosis Date   Anxiety    Hypersomnia    Rhinitis    Past Surgical History:  Procedure Laterality Date   TOE SURGERY     WISDOM TOOTH EXTRACTION     Patient Active Problem List   Diagnosis Date Noted   OSA (obstructive sleep apnea) 08/18/2021   Daytime sleepiness 08/18/2021   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 11/04/2020   Leg weakness, bilateral 11/04/2020   High total serum IgM 11/06/2019   Numbness 11/02/2019   Polyneuropathy 11/02/2019   Bronchitis, acute 06/23/2011   Seasonal and perennial allergic rhinitis 03/20/2008   Hypersomnia 03/19/2007    ONSET DATE: 2021  REFERRING DIAG: G61.81 (ICD-10-CM) - CIDP (chronic inflammatory demyelinating polyneuropathy) (HCC) R29.898 (ICD-10-CM) - Leg weakness, bilateral R26.9 (ICD-10-CM) - Gait disorder  THERAPY DIAG:  Muscle weakness (generalized)  Unsteadiness on feet  Difficulty in walking, not elsewhere classified  Other abnormalities of gait and mobility  Rationale for Evaluation and Treatment: Rehabilitation  SUBJECTIVE:                                                                                                                                                                                             SUBJECTIVE STATEMENT: Brought the knee brace to show (wearing left medial unloader on left knee) Pt accompanied by: friend-Danielle  PERTINENT HISTORY: 59 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 He had generally been in good health until 2020. He became deaf over a few days in July 2020. However hearing had declined some in preceding month (mildly muffled). He also became more ubbalanced that month and fell 12/03/2020. He has had ED since 2020 as well. Dysesthesias and numbness began around September or October 2020. Balance worsened around that time as well and he had a fall at that time. He feels symptoms have progressively worsened since the onset. He is receiving IVIG for the CIDP.  PAIN:  Are you having pain? Yes: NPRS scale: 3/10 Pain location: BLE and LBP Pain description: tight Aggravating factors: feet worse in the AM Relieving factors: movement  PRECAUTIONS: Fall  RED FLAGS: None, reports some change to bladder  WEIGHT BEARING RESTRICTIONS: No  FALLS: Has patient fallen in last 6 months? Yes. Number of falls 1-2/month Denies any injuries.  LIVING ENVIRONMENT: Lives with: lives alone Lives in: House/apartment Stairs: No Has following equipment at home: Single point cane and Environmental consultant - 4 wheeled  PLOF: Independent, uses shower chair, grab bars  PATIENT GOALS: prevent atrophy  OBJECTIVE:   TODAY'S TREATMENT: 02/11/23 Activity Comments  Gait training Right ground reaction AFO and trekking poles  Multisensory balance    Instruction in adaptive strength training               TODAY'S TREATMENT: 02/03/23 Activity Comments  AFO trials LLE -PLS AFO -ground reaction AFO  Review of LE stretching -demo good methods for gastroc -hamstring stretch: semi-long sit on bed 2x60 sec                PATIENT EDUCATION: Education details: assessment details, rationale of PT intervention Person educated: Patient and friend Education method: Explanation and Handouts Education comprehension: verbalized understanding  HOME EXERCISE PROGRAM: Access Code: [email or web address] URL: [email or web address] Date:  01/21/2023 Prepared by: Shary Decamp  Exercises - Standing Balance in Corner  - 1 x daily - 7 x weekly - 3 sets - 15-30 sec hold - Standing Balance in Corner with Eyes Closed  - 1 x daily - 7 x weekly - 3 sets - 15-30 sec hold - Corner Balance Feet Apart: Eyes Open With Head Turns  - 1 x daily - 7 x weekly - 3 sets - 3 reps - Seated Table Hamstring Stretch  - 1 x daily - 7 x weekly - 3 sets - 10 reps - 3-5 sec hold  DIAGNOSTIC FINDINGS: n/a for current episode  COGNITION: Overall cognitive status: Within functional limits for tasks assessed   SENSATION: Impaired mid-shin to feet  COORDINATION: Alternating coordination limited by weakness  EDEMA:  none  MUSCLE TONE: hypotonic  MUSCLE LENGTH: Hamstrings: Right -5 deg; Left -5 deg     POSTURE: No Significant postural limitations  LOWER EXTREMITY ROM:     Active  Right Eval Left Eval  Hip flexion    Hip extension    Hip abduction    Hip adduction    Hip internal rotation    Hip external rotation    Knee flexion 120 120  Knee extension -5 -5  Ankle dorsiflexion 5 5  Ankle plantarflexion    Ankle inversion    Ankle eversion     (Blank rows = not tested)  Able to achieve plantigrade in PROM  LOWER EXTREMITY MMT:    MMT Right Eval Left Eval  Hip flexion 3 3  Hip extension    Hip abduction 3 3  Hip adduction 3 3  Hip internal rotation    Hip external rotation    Knee flexion 3 3  Knee extension 3 3  Ankle dorsiflexion 2+ 2+  Ankle plantarflexion 2 2  Ankle inversion    Ankle eversion    (Blank rows = not tested)  BED MOBILITY:  Indep  TRANSFERS: Assistive device utilized: None  Sit to stand: Complete Independence and Modified independence Stand to sit: Complete Independence and Modified independence Chair to chair: Complete Independence and Modified independence Floor: NT    CURB:  Level  of Assistance: Modified independence Assistive device utilized: None Curb Comments:    STAIRS: NT  GAIT: Gait pattern: wide BOS, poor foot clearance- Right, and poor foot clearance- Left Distance walked:  Assistive device utilized: None Level of assistance: Complete Independence and Modified independence Comments:   FUNCTIONAL TESTS:  5 times sit to stand: 38 sec Timed up and go (TUG): 25 sec Berg Balance Scale: 37/56  M-CTSIB  Condition 1: Firm Surface, EO 30 Sec, Moderate Sway  Condition 2: Firm Surface, EC 5 Sec, Moderate and Severe Sway  Condition 3: Foam Surface, EO  Sec,  Sway  Condition 4: Foam Surface, EC  Sec,  Sway     TODAY'S TREATMENT:                                                                                                                              DATE: 01/21/23      GOALS: Goals reviewed with patient? Yes  SHORT TERM GOALS: Target date: 02/11/2023    Patient will be independent in HEP to improve functional outcomes Baseline: Goal status: IN PROGRESS  2.  Demo improved BLE strength per time of 30 sec 5xSTS Baseline: 38 sec Goal status: IN PROGRESS  3.  Demo improved safety with ambulation and lower risk for falls per time of 20 sec TUG test Baseline: 25 sec Goal status: IN PROGRESS    LONG TERM GOALS: Target date: 03/04/2023    Demo low risk for falls per score 45/56 Berg Balance Test Baseline: 37/56 Goal status: INITIAL  2.  Demo lower risk for falls per time of 15 sec TUG test Baseline:  Goal status: INITIAL  3.  Demo improved BLE strength per time of 25 sec 5xSTS test Baseline:  Goal status: INITIAL   ASSESSMENT:  CLINICAL IMPRESSION: Initiated with training using right ground-reaction AFO to improve foot clearance and improve loading response with excellent results demonstrating reduced BOS and heel strike initial contact. Demo and practice in use of trekking poles to improve stability and speed for ambulation on uneven surfaces demonstrating good sequencing after initial instruction.  Explanation and  demonstration of adaptive techniques for strength training upper body in gym environment. Pt would benefit from Right AFO to improve safety with transfers and gait  OBJECTIVE IMPAIRMENTS: Abnormal gait, decreased activity tolerance, decreased balance, decreased endurance, decreased knowledge of use of DME, decreased mobility, difficulty walking, decreased ROM, decreased strength, impaired flexibility, impaired tone, and pain.   ACTIVITY LIMITATIONS: carrying, lifting, bending, standing, stairs, transfers, reach over head, and locomotion level  PARTICIPATION LIMITATIONS: meal prep, cleaning, shopping, community activity, and exercise routine  PERSONAL FACTORS: Past/current experiences and Time since onset of injury/illness/exacerbation are also affecting patient's functional outcome.   REHAB POTENTIAL: Good  CLINICAL DECISION MAKING: Evolving/moderate complexity  EVALUATION COMPLEXITY: Moderate  PLAN:  PT FREQUENCY: 1x/week  PT DURATION: 6 weeks  PLANNED INTERVENTIONS: Therapeutic exercises, Therapeutic activity, Neuromuscular re-education, Balance training, Gait training, Patient/Family education, Self  Care, Joint mobilization, Stair training, Vestibular training, Canalith repositioning, Orthotic/Fit training, DME instructions, Aquatic Therapy, Dry Needling, Electrical stimulation, Spinal mobilization, Cryotherapy, Moist heat, and Manual therapy  PLAN FOR NEXT SESSION: CHECK STG   8:01 AM, 02/11/23 M. Shary Decamp, PT, DPT Physical Therapist- [place] Office Number: [phone]

## 2023-02-16 ENCOUNTER — Telehealth: Payer: Self-pay

## 2023-02-16 ENCOUNTER — Ambulatory Visit: Payer: Medicare Other

## 2023-02-16 DIAGNOSIS — R262 Difficulty in walking, not elsewhere classified: Secondary | ICD-10-CM

## 2023-02-16 DIAGNOSIS — M6281 Muscle weakness (generalized): Secondary | ICD-10-CM | POA: Diagnosis not present

## 2023-02-16 DIAGNOSIS — R2681 Unsteadiness on feet: Secondary | ICD-10-CM

## 2023-02-16 DIAGNOSIS — R2689 Other abnormalities of gait and mobility: Secondary | ICD-10-CM

## 2023-02-16 NOTE — Addendum Note (Signed)
Addended by: Despina Arias A on: 02/16/2023 09:02 PM   Modules accepted: Orders

## 2023-02-16 NOTE — Telephone Encounter (Signed)
Hi Dr. Epimenio Foot,  [name] is being treated by physical therapy for CIDP and subsequent Right foot drop.  Tommy Ellison will benefit from use of RIGHT ground-reaction AFO in order to improve safety with functional mobility.    Due to insurance regulations, patients must have a face-to-face visit with a physician within the last 6 months wherein the benefit of bracing has been discussed and documented in the patient file. You may want to have the patient schedule a new appointment with you, or if you feel comfortable you may addend your previous note with the patient, stating that orthotics were discussed.   If you agree, please submit request in EPIC under MD Order, Other Orders (list RIGHT AFO in comments) and please include the ICD-10 code, MD signature, and NPI. You may also fax to [facility] at [phone].   Thank you, 7:54 AM, 02/16/23 M. Shary Decamp, PT, DPT Physical Therapist- [place] Office Number: [phone]    [facility] [address] [place], [place]  [address] Phone:  [phone] Fax:  [phone]

## 2023-02-16 NOTE — Therapy (Signed)
OUTPATIENT PHYSICAL THERAPY NEURO TREATMENT   Patient Name: Tommy Ellison MRN: [id number] DOB:08-21-1975, 47 y.o., male Today's Date: 02/16/2023   PCP: Eustaquio Boyden, DO REFERRING PROVIDER: Asa Lente, MD  END OF SESSION:  PT End of Session - 02/16/23 0801     Visit Number 4    Number of Visits 6    Date for PT Re-Evaluation 03/04/23    Authorization Type Medicare/Medicaid    Progress Note Due on Visit 10    PT Start Time 0800    PT Stop Time 0845    PT Time Calculation (min) 45 min             Past Medical History:  Diagnosis Date   Anxiety    Hypersomnia    Rhinitis    Past Surgical History:  Procedure Laterality Date   TOE SURGERY     WISDOM TOOTH EXTRACTION     Patient Active Problem List   Diagnosis Date Noted   OSA (obstructive sleep apnea) 08/18/2021   Daytime sleepiness 08/18/2021   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 11/04/2020   Leg weakness, bilateral 11/04/2020   High total serum IgM 11/06/2019   Numbness 11/02/2019   Polyneuropathy 11/02/2019   Bronchitis, acute 06/23/2011   Seasonal and perennial allergic rhinitis 03/20/2008   Hypersomnia 03/19/2007    ONSET DATE: 2021  REFERRING DIAG: G61.81 (ICD-10-CM) - CIDP (chronic inflammatory demyelinating polyneuropathy) (HCC) R29.898 (ICD-10-CM) - Leg weakness, bilateral R26.9 (ICD-10-CM) - Gait disorder  THERAPY DIAG:  Muscle weakness (generalized)  Unsteadiness on feet  Difficulty in walking, not elsewhere classified  Other abnormalities of gait and mobility  Rationale for Evaluation and Treatment: Rehabilitation  SUBJECTIVE:                                                                                                                                                                                             SUBJECTIVE STATEMENT: Had an infusion tx yesterday, and the 2nd one is today. Pt accompanied by: friend-Danielle  PERTINENT HISTORY: 4 year old man with  deafness and polyneuropathy since 2020. He had generally been in good health until 2020. He became deaf over a few days in July 2020. However hearing had declined some in preceding month (mildly muffled). He also became more ubbalanced that month and fell 12/03/2020. He has had ED since 2020 as well. Dysesthesias and numbness began around September or October 2020. Balance worsened around that time as well and he had a fall at that time. He feels symptoms have progressively worsened since the onset. He is receiving IVIG for the CIDP.  PAIN:  Are you having pain? Yes: NPRS scale: 3/10 Pain location: BLE and LBP Pain description: tight Aggravating factors: feet worse in the AM Relieving factors: movement  PRECAUTIONS: Fall  RED FLAGS: None, reports some change to bladder  WEIGHT BEARING RESTRICTIONS: No  FALLS: Has patient fallen in last 6 months? Yes. Number of falls 1-2/month Denies any injuries.  LIVING ENVIRONMENT: Lives with: lives alone Lives in: House/apartment Stairs: No Has following equipment at home: Single point cane and Environmental consultant - 4 wheeled  PLOF: Independent, uses shower chair, grab bars  PATIENT GOALS: prevent atrophy  OBJECTIVE:   TODAY'S TREATMENT: 02/16/23 Activity Comments  5xSTS 26 sec  TUG test 1) 16 sec 2) 15 sec w/ right AFO  Deadlift 1x5 -romanian -traditional  Retrowalk/sidestep 2x20 ft   Standing on foam Multi-sensory balance activities to improve stability for ADL        TODAY'S TREATMENT: 02/11/23 Activity Comments  Gait training Right ground reaction AFO and trekking poles  Teacher, music    Instruction in adaptive strength training                  PATIENT EDUCATION: Education details: assessment details, rationale of PT intervention Person educated: Patient and friend Education method: Explanation and Handouts Education comprehension: verbalized understanding  HOME EXERCISE PROGRAM: Access Code: [email or web address] URL:  [email or web address] Date: 01/21/2023 Prepared by: Shary Decamp  Exercises - Standing Balance in Corner  - 1 x daily - 7 x weekly - 3 sets - 15-30 sec hold - Standing Balance in Corner with Eyes Closed  - 1 x daily - 7 x weekly - 3 sets - 15-30 sec hold - Corner Balance Feet Apart: Eyes Open With Head Turns  - 1 x daily - 7 x weekly - 3 sets - 3 reps - Seated Table Hamstring Stretch  - 1 x daily - 7 x weekly - 3 sets - 10 reps - 3-5 sec hold - Half Deadlift with Kettlebell  - 1 x daily - 7 x weekly - 3 sets - 10 reps - Barbell Deadlift  - 1 x daily - 7 x weekly - 3 sets - 10 reps  DIAGNOSTIC FINDINGS: n/a for current episode  COGNITION: Overall cognitive status: Within functional limits for tasks assessed   SENSATION: Impaired mid-shin to feet  COORDINATION: Alternating coordination limited by weakness  EDEMA:  none  MUSCLE TONE: hypotonic  MUSCLE LENGTH: Hamstrings: Right -5 deg; Left -5 deg     POSTURE: No Significant postural limitations  LOWER EXTREMITY ROM:     Active  Right Eval Left Eval  Hip flexion    Hip extension    Hip abduction    Hip adduction    Hip internal rotation    Hip external rotation    Knee flexion 120 120  Knee extension -5 -5  Ankle dorsiflexion 5 5  Ankle plantarflexion    Ankle inversion    Ankle eversion     (Blank rows = not tested)  Able to achieve plantigrade in PROM  LOWER EXTREMITY MMT:    MMT Right Eval Left Eval  Hip flexion 3 3  Hip extension    Hip abduction 3 3  Hip adduction 3 3  Hip internal rotation    Hip external rotation    Knee flexion 3 3  Knee extension 3 3  Ankle dorsiflexion 2+ 2+  Ankle plantarflexion 2 2  Ankle inversion    Ankle eversion    (Blank rows = not tested)  BED  MOBILITY:  Indep  TRANSFERS: Assistive device utilized: None  Sit to stand: Complete Independence and Modified independence Stand to sit: Complete Independence and Modified independence Chair to  chair: Complete Independence and Modified independence Floor: NT    CURB:  Level of Assistance: Modified independence Assistive device utilized: None Curb Comments:   STAIRS: NT  GAIT: Gait pattern: wide BOS, poor foot clearance- Right, and poor foot clearance- Left Distance walked:  Assistive device utilized: None Level of assistance: Complete Independence and Modified independence Comments:   FUNCTIONAL TESTS:  5 times sit to stand: 38 sec Timed up and go (TUG): 25 sec Berg Balance Scale: 37/56  M-CTSIB  Condition 1: Firm Surface, EO 30 Sec, Moderate Sway  Condition 2: Firm Surface, EC 5 Sec, Moderate and Severe Sway  Condition 3: Foam Surface, EO  Sec,  Sway  Condition 4: Foam Surface, EC  Sec,  Sway     TODAY'S TREATMENT:                                                                                                                              DATE: 01/21/23      GOALS: Goals reviewed with patient? Yes  SHORT TERM GOALS: Target date: 02/11/2023    Patient will be independent in HEP to improve functional outcomes Baseline: Goal status: MET  2.  Demo improved BLE strength per time of 30 sec 5xSTS Baseline: 38 sec; 26 sec Goal status: MET  3.  Demo improved safety with ambulation and lower risk for falls per time of 20 sec TUG test Baseline: 25 sec; 16 sec Goal status: MET    LONG TERM GOALS: Target date: 03/04/2023    Demo low risk for falls per score 45/56 Berg Balance Test Baseline: 37/56 Goal status: INITIAL  2.  Demo lower risk for falls per time of 15 sec TUG test Baseline:  Goal status: INITIAL  3.  Demo improved BLE strength per time of 25 sec 5xSTS test Baseline:  Goal status: INITIAL   ASSESSMENT:  CLINICAL IMPRESSION: Met 3/3 STG and demo overall improve mobility and postural stability under multi-sensory conditions.  Continued gait trials with right ground-reaction AFO to good effect with improve pattern and report of reduced  fatigue using device.  Patient would benefit from right AFO to improve safety with transfers and gait. Focus on training in compound LE movements in closed chain format for greater functional strength development with pt experiencing worse fatigue today due to infusion treatments requiring reduced reps and greater therapeutic rest periods. Continued session to progress POC details and meet LTG  OBJECTIVE IMPAIRMENTS: Abnormal gait, decreased activity tolerance, decreased balance, decreased endurance, decreased knowledge of use of DME, decreased mobility, difficulty walking, decreased ROM, decreased strength, impaired flexibility, impaired tone, and pain.   ACTIVITY LIMITATIONS: carrying, lifting, bending, standing, stairs, transfers, reach over head, and locomotion level  PARTICIPATION LIMITATIONS: meal prep, cleaning, shopping, community activity, and exercise routine  PERSONAL  FACTORS: Past/current experiences and Time since onset of injury/illness/exacerbation are also affecting patient's functional outcome.   REHAB POTENTIAL: Good  CLINICAL DECISION MAKING: Evolving/moderate complexity  EVALUATION COMPLEXITY: Moderate  PLAN:  PT FREQUENCY: 1x/week  PT DURATION: 6 weeks  PLANNED INTERVENTIONS: Therapeutic exercises, Therapeutic activity, Neuromuscular re-education, Balance training, Gait training, Patient/Family education, Self Care, Joint mobilization, Stair training, Vestibular training, Canalith repositioning, Orthotic/Fit training, DME instructions, Aquatic Therapy, Dry Needling, Electrical stimulation, Spinal mobilization, Cryotherapy, Moist heat, and Manual therapy  PLAN FOR NEXT SESSION: HEP review, banded sidestepping   8:02 AM, 02/16/23 M. Shary Decamp, PT, DPT Physical Therapist- [place] Office Number: [phone]

## 2023-02-23 ENCOUNTER — Ambulatory Visit: Payer: Medicare Other | Attending: Neurology

## 2023-02-23 DIAGNOSIS — M6281 Muscle weakness (generalized): Secondary | ICD-10-CM | POA: Insufficient documentation

## 2023-02-23 DIAGNOSIS — R2681 Unsteadiness on feet: Secondary | ICD-10-CM | POA: Insufficient documentation

## 2023-02-23 DIAGNOSIS — R2689 Other abnormalities of gait and mobility: Secondary | ICD-10-CM | POA: Insufficient documentation

## 2023-02-23 DIAGNOSIS — R262 Difficulty in walking, not elsewhere classified: Secondary | ICD-10-CM | POA: Insufficient documentation

## 2023-02-23 NOTE — Therapy (Signed)
OUTPATIENT PHYSICAL THERAPY NEURO TREATMENT   Patient Name: Tommy Ellison MRN: [id number] DOB:05/16/1976, 47 y.o., male Today's Date: 02/23/2023   PCP: Eustaquio Boyden, DO REFERRING PROVIDER: Asa Lente, MD  END OF SESSION:  PT End of Session - 02/23/23 0759     Visit Number 5    Number of Visits 6    Date for PT Re-Evaluation 03/04/23    Authorization Type Medicare/Medicaid    Progress Note Due on Visit 10    PT Start Time 0800    PT Stop Time 0845    PT Time Calculation (min) 45 min             Past Medical History:  Diagnosis Date   Anxiety    Hypersomnia    Rhinitis    Past Surgical History:  Procedure Laterality Date   TOE SURGERY     WISDOM TOOTH EXTRACTION     Patient Active Problem List   Diagnosis Date Noted   OSA (obstructive sleep apnea) 08/18/2021   Daytime sleepiness 08/18/2021   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 11/04/2020   Leg weakness, bilateral 11/04/2020   High total serum IgM 11/06/2019   Numbness 11/02/2019   Polyneuropathy 11/02/2019   Bronchitis, acute 06/23/2011   Seasonal and perennial allergic rhinitis 03/20/2008   Hypersomnia 03/19/2007    ONSET DATE: 2021  REFERRING DIAG: G61.81 (ICD-10-CM) - CIDP (chronic inflammatory demyelinating polyneuropathy) (HCC) R29.898 (ICD-10-CM) - Leg weakness, bilateral R26.9 (ICD-10-CM) - Gait disorder  THERAPY DIAG:  Muscle weakness (generalized)  Unsteadiness on feet  Difficulty in walking, not elsewhere classified  Other abnormalities of gait and mobility  Rationale for Evaluation and Treatment: Rehabilitation  SUBJECTIVE:                                                                                                                                                                                             SUBJECTIVE STATEMENT: Feeling better after IV tx. Slipped and twisted left knee a bit last week.  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 He had generally been in good health until 2020. He became deaf over a few days in July 2020. However hearing had declined some in preceding month (mildly muffled). He also became more ubbalanced that month and fell 12/03/2020. He has had ED since 2020 as well. Dysesthesias and numbness began around September or October 2020. Balance worsened around that time as well and he had a fall at that time. He feels symptoms have progressively worsened since the onset. He is receiving IVIG for the  CIDP.  PAIN:  Are you having pain? Yes: NPRS scale: 3/10 Pain location: BLE and LBP Pain description: tight Aggravating factors: feet worse in the AM Relieving factors: movement  PRECAUTIONS: Fall  RED FLAGS: None, reports some change to bladder  WEIGHT BEARING RESTRICTIONS: No  FALLS: Has patient fallen in last 6 months? Yes. Number of falls 1-2/month Denies any injuries.  LIVING ENVIRONMENT: Lives with: lives alone Lives in: House/apartment Stairs: No Has following equipment at home: Single point cane and Environmental consultant - 4 wheeled  PLOF: Independent, uses shower chair, grab bars  PATIENT GOALS: prevent atrophy  OBJECTIVE:   TODAY'S TREATMENT: 02/23/23 Activity Comments  Physioball wall squats 3x5   Banded deadlift 3x5   Sidestep with blue band   Paloff press    Pt education regarding home equipment         TODAY'S TREATMENT: 02/16/23 Activity Comments  5xSTS 26 sec  TUG test 1) 16 sec 2) 15 sec w/ right AFO  Deadlift 1x5 -romanian -traditional  Retrowalk/sidestep 2x20 ft   Standing on foam Multi-sensory balance activities to improve stability for ADL               PATIENT EDUCATION: Education details: assessment details, rationale of PT intervention Person educated: Patient and friend Education method: Explanation and Handouts Education comprehension: verbalized understanding  HOME EXERCISE PROGRAM: Access Code:  [email or web address] URL: [email or web address] Date: 01/21/2023 Prepared by: Shary Decamp  Exercises - Standing Balance in Corner  - 1 x daily - 7 x weekly - 3 sets - 15-30 sec hold - Standing Balance in Corner with Eyes Closed  - 1 x daily - 7 x weekly - 3 sets - 15-30 sec hold - Corner Balance Feet Apart: Eyes Open With Head Turns  - 1 x daily - 7 x weekly - 3 sets - 3 reps - Seated Table Hamstring Stretch  - 1 x daily - 7 x weekly - 3 sets - 10 reps - 3-5 sec hold - Half Deadlift with Kettlebell  - 1 x daily - 7 x weekly - 3 sets - 10 reps - Barbell Deadlift  - 1 x daily - 7 x weekly - 3 sets - 10 reps - Wall Squat with Swiss Ball  - 1 x daily - 7 x weekly - 5 sets - 5 reps - Deadlift with Resistance  - 1 x daily - 7 x weekly - 5 sets - 5 reps - Sidestep with Kick Back with CLX Band  - 1 x daily - 7 x weekly - 3 sets - 10 reps - Standing Anti-Rotation Press with Anchored Resistance  - 1 x daily - 7 x weekly - 3 sets - 10 reps  DIAGNOSTIC FINDINGS: n/a for current episode  COGNITION: Overall cognitive status: Within functional limits for tasks assessed   SENSATION: Impaired mid-shin to feet  COORDINATION: Alternating coordination limited by weakness  EDEMA:  none  MUSCLE TONE: hypotonic  MUSCLE LENGTH: Hamstrings: Right -5 deg; Left -5 deg     POSTURE: No Significant postural limitations  LOWER EXTREMITY ROM:     Active  Right Eval Left Eval  Hip flexion    Hip extension    Hip abduction    Hip adduction    Hip internal rotation    Hip external rotation    Knee flexion 120 120  Knee extension -5 -5  Ankle dorsiflexion 5 5  Ankle plantarflexion    Ankle inversion    Ankle eversion     (  Blank rows = not tested)  Able to achieve plantigrade in PROM  LOWER EXTREMITY MMT:    MMT Right Eval Left Eval  Hip flexion 3 3  Hip extension    Hip abduction 3 3  Hip adduction 3 3  Hip internal rotation    Hip external rotation    Knee flexion 3 3  Knee  extension 3 3  Ankle dorsiflexion 2+ 2+  Ankle plantarflexion 2 2  Ankle inversion    Ankle eversion    (Blank rows = not tested)  BED MOBILITY:  Indep  TRANSFERS: Assistive device utilized: None  Sit to stand: Complete Independence and Modified independence Stand to sit: Complete Independence and Modified independence Chair to chair: Complete Independence and Modified independence Floor: NT    CURB:  Level of Assistance: Modified independence Assistive device utilized: None Curb Comments:   STAIRS: NT  GAIT: Gait pattern: wide BOS, poor foot clearance- Right, and poor foot clearance- Left Distance walked:  Assistive device utilized: None Level of assistance: Complete Independence and Modified independence Comments:   FUNCTIONAL TESTS:  5 times sit to stand: 38 sec Timed up and go (TUG): 25 sec Berg Balance Scale: 37/56  M-CTSIB  Condition 1: Firm Surface, EO 30 Sec, Moderate Sway  Condition 2: Firm Surface, EC 5 Sec, Moderate and Severe Sway  Condition 3: Foam Surface, EO  Sec,  Sway  Condition 4: Foam Surface, EC  Sec,  Sway     TODAY'S TREATMENT:                                                                                                                              DATE: 01/21/23      GOALS: Goals reviewed with patient? Yes  SHORT TERM GOALS: Target date: 02/11/2023    Patient will be independent in HEP to improve functional outcomes Baseline: Goal status: MET  2.  Demo improved BLE strength per time of 30 sec 5xSTS Baseline: 38 sec; 26 sec Goal status: MET  3.  Demo improved safety with ambulation and lower risk for falls per time of 20 sec TUG test Baseline: 25 sec; 16 sec Goal status: MET    LONG TERM GOALS: Target date: 03/04/2023    Demo low risk for falls per score 45/56 Berg Balance Test Baseline: 37/56 Goal status: INITIAL  2.  Demo lower risk for falls per time of 15 sec TUG test Baseline:  Goal status: INITIAL  3.  Demo  improved BLE strength per time of 25 sec 5xSTS test Baseline:  Goal status: INITIAL   ASSESSMENT:  CLINICAL IMPRESSION: Provided with MD referral/Rx for right AFO.  Demonstration of additional closed chain exercises for LE strength/power development prioritizing compound movements and pt education in relevant self-progressions.  Education regarding sets/reps regarding power/recruitment and relevant work:rest ratios for max engagement/recruitment.  Verbalizes understanding.  Continued sessions to progress POC details  OBJECTIVE IMPAIRMENTS: Abnormal gait, decreased activity tolerance, decreased balance,  decreased endurance, decreased knowledge of use of DME, decreased mobility, difficulty walking, decreased ROM, decreased strength, impaired flexibility, impaired tone, and pain.   ACTIVITY LIMITATIONS: carrying, lifting, bending, standing, stairs, transfers, reach over head, and locomotion level  PARTICIPATION LIMITATIONS: meal prep, cleaning, shopping, community activity, and exercise routine  PERSONAL FACTORS: Past/current experiences and Time since onset of injury/illness/exacerbation are also affecting patient's functional outcome.   REHAB POTENTIAL: Good  CLINICAL DECISION MAKING: Evolving/moderate complexity  EVALUATION COMPLEXITY: Moderate  PLAN:  PT FREQUENCY: 1x/week  PT DURATION: 6 weeks  PLANNED INTERVENTIONS: Therapeutic exercises, Therapeutic activity, Neuromuscular re-education, Balance training, Gait training, Patient/Family education, Self Care, Joint mobilization, Stair training, Vestibular training, Canalith repositioning, Orthotic/Fit training, DME instructions, Aquatic Therapy, Dry Needling, Electrical stimulation, Spinal mobilization, Cryotherapy, Moist heat, and Manual therapy  PLAN FOR NEXT SESSION: balance activities review   7:59 AM, 02/23/23 M. Shary Decamp, PT, DPT Physical Therapist- [place] Office Number: [phone]

## 2023-03-02 ENCOUNTER — Ambulatory Visit: Payer: Medicare Other

## 2023-03-02 DIAGNOSIS — R262 Difficulty in walking, not elsewhere classified: Secondary | ICD-10-CM

## 2023-03-02 DIAGNOSIS — M6281 Muscle weakness (generalized): Secondary | ICD-10-CM

## 2023-03-02 DIAGNOSIS — R2689 Other abnormalities of gait and mobility: Secondary | ICD-10-CM

## 2023-03-02 DIAGNOSIS — R2681 Unsteadiness on feet: Secondary | ICD-10-CM

## 2023-03-02 NOTE — Therapy (Signed)
OUTPATIENT PHYSICAL THERAPY NEURO TREATMENT and D/C Summary   Patient Name: Tommy Ellison MRN: [id number] DOB:1976-04-12, 47 y.o., male Today's Date: 03/02/2023   PCP: Eustaquio Boyden, DO REFERRING PROVIDER: Asa Lente, MD PHYSICAL THERAPY DISCHARGE SUMMARY  Visits from Start of Care: 6  Current functional level related to goals / functional outcomes: 5/6 goals met. Improved Berg Balance test and 5xSTS to meet low risk for falls criteria.    Remaining deficits: LE weakness and gait dysfunction due to foot drop. Will be pursuing AFO intervention   Education / Equipment: HEP   Patient agrees to discharge. Patient goals were partially met. Patient is being discharged due to being pleased with the current functional level.  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 11/04/2020   Leg weakness, bilateral 11/04/2020   High total serum IgM 11/06/2019   Numbness 11/02/2019   Polyneuropathy 11/02/2019   Bronchitis, acute 06/23/2011   Seasonal and perennial allergic rhinitis 03/20/2008   Hypersomnia 03/19/2007    ONSET DATE: 2021  REFERRING DIAG: G61.81 (ICD-10-CM) - CIDP (chronic inflammatory demyelinating polyneuropathy) (HCC) R29.898 (ICD-10-CM) - Leg weakness, bilateral R26.9 (ICD-10-CM) - Gait disorder  THERAPY DIAG:  Muscle weakness  (generalized)  Unsteadiness on feet  Difficulty in walking, not elsewhere classified  Other abnormalities of gait and mobility  Rationale for Evaluation and Treatment: Rehabilitation  SUBJECTIVE:                                                                                                                                                                                             SUBJECTIVE STATEMENT: Left knee pain is a little better, tweaked it a little.  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  He had generally been in good health until 2020. He became deaf over a few days in July 2020. However hearing had declined some in preceding month (mildly muffled). He also became more ubbalanced that month and fell 12/03/2020. He has had ED since 2020 as well. Dysesthesias and numbness began around September or October 2020. Balance worsened around that time as well and he had a fall at that time. He feels symptoms have progressively worsened since the onset. He is receiving IVIG for the CIDP.  PAIN:  Are you having pain? Yes: NPRS scale: 2/10 Pain location: left knee--lateral joint line > medial Pain description: sore/ache Aggravating factors: feet worse in the AM Relieving factors: movement  PRECAUTIONS: Fall  RED FLAGS: None, reports some change to bladder  WEIGHT BEARING RESTRICTIONS: No  FALLS: Has patient fallen in last 6 months? Yes. Number of falls 1-2/month Denies any injuries.  LIVING ENVIRONMENT: Lives with: lives alone Lives in: House/apartment Stairs: No Has following equipment at home: Single point cane and Environmental consultant - 4 wheeled  PLOF: Independent, uses shower chair, grab bars  PATIENT GOALS: prevent atrophy  OBJECTIVE:   TODAY'S TREATMENT: 03/02/23 Activity Comments  STG/LTG review See below  HEP review Provided reference for corner balance progressions                 TODAY'S  TREATMENT: 02/23/23 Activity Comments  Physioball wall squats 3x5   Banded deadlift 3x5   Sidestep with blue band   Paloff press    Pt education regarding home equipment         TODAY'S TREATMENT: 02/16/23 Activity Comments  5xSTS 26 sec  TUG test 1) 16 sec 2) 15 sec w/ right AFO  Deadlift 1x5 -romanian -traditional  Retrowalk/sidestep 2x20 ft   Standing on foam Multi-sensory balance activities to improve stability for ADL               PATIENT EDUCATION: Education details: assessment details, rationale of PT intervention Person educated: Patient and friend Education method: Explanation and Handouts Education comprehension: verbalized understanding  HOME EXERCISE PROGRAM: Access Code: [email or web address] URL: [email or web address] Date: 01/21/2023 Prepared by: Shary Decamp  Exercises - Standing Balance in Corner  - 1 x daily - 7 x weekly - 3 sets - 15-30 sec hold - Standing Balance in Corner with Eyes Closed  - 1 x daily - 7 x weekly - 3 sets - 15-30 sec hold - Corner Balance Feet Apart: Eyes Open With Head Turns  - 1 x daily - 7 x weekly - 3 sets - 3 reps - Seated Table Hamstring Stretch  - 1 x daily - 7 x weekly - 3 sets - 10 reps - 3-5 sec hold - Half Deadlift with Kettlebell  - 1 x daily - 7 x weekly - 3 sets - 10 reps - Barbell Deadlift  - 1 x daily - 7 x weekly - 3 sets - 10 reps - Wall Squat with Swiss Ball  - 1 x daily - 7 x weekly - 5 sets - 5 reps - Deadlift with Resistance  - 1 x daily - 7 x weekly - 5 sets - 5 reps - Sidestep with Kick Back with CLX Band  - 1 x daily - 7 x weekly - 3 sets - 10 reps - Standing Anti-Rotation Press with Anchored Resistance  - 1 x daily - 7 x weekly - 3 sets - 10 reps  DIAGNOSTIC FINDINGS: n/a for current episode  COGNITION: Overall cognitive status: Within  functional limits for tasks assessed   SENSATION: Impaired mid-shin to feet  COORDINATION: Alternating coordination limited by weakness  EDEMA:   none  MUSCLE TONE: hypotonic  MUSCLE LENGTH: Hamstrings: Right -5 deg; Left -5 deg     POSTURE: No Significant postural limitations  LOWER EXTREMITY ROM:     Active  Right Eval Left Eval  Hip flexion    Hip extension    Hip abduction    Hip adduction    Hip internal rotation    Hip external rotation    Knee flexion 120 120  Knee extension -5 -5  Ankle dorsiflexion 5 5  Ankle plantarflexion    Ankle inversion    Ankle eversion     (Blank rows = not tested)  Able to achieve plantigrade in PROM  LOWER EXTREMITY MMT:    MMT Right Eval Left Eval  Hip flexion 3 3  Hip extension    Hip abduction 3 3  Hip adduction 3 3  Hip internal rotation    Hip external rotation    Knee flexion 3 3  Knee extension 3 3  Ankle dorsiflexion 2+ 2+  Ankle plantarflexion 2 2  Ankle inversion    Ankle eversion    (Blank rows = not tested)  BED MOBILITY:  Indep  TRANSFERS: Assistive device utilized: None  Sit to stand: Complete Independence and Modified independence Stand to sit: Complete Independence and Modified independence Chair to chair: Complete Independence and Modified independence Floor: NT    CURB:  Level of Assistance: Modified independence Assistive device utilized: None Curb Comments:   STAIRS: NT  GAIT: Gait pattern: wide BOS, poor foot clearance- Right, and poor foot clearance- Left Distance walked:  Assistive device utilized: None Level of assistance: Complete Independence and Modified independence Comments:   FUNCTIONAL TESTS:  5 times sit to stand: 38 sec Timed up and go (TUG): 25 sec Berg Balance Scale: 37/56  M-CTSIB  Condition 1: Firm Surface, EO 30 Sec, Moderate Sway  Condition 2: Firm Surface, EC 5 Sec, Moderate and Severe Sway  Condition 3: Foam Surface, EO  Sec,  Sway  Condition 4: Foam Surface, EC  Sec,  Sway     TODAY'S TREATMENT:                                                                                                                               DATE: 01/21/23      GOALS: Goals reviewed with patient? Yes  SHORT TERM GOALS: Target date: 02/11/2023    Patient will be independent in HEP to improve functional outcomes Baseline: Goal status: MET  2.  Demo improved BLE strength per time of 30 sec 5xSTS Baseline: 38 sec; 26 sec Goal status: MET  3.  Demo improved safety with ambulation and lower risk for falls per time of 20 sec TUG test Baseline: 25 sec; 16 sec Goal status: MET    LONG TERM GOALS: Target date: 03/04/2023  Demo low risk for falls per score 45/56 Berg Balance Test Baseline: 37/56; (03/02/23) 49/56 Goal status: MET  2.  Demo lower risk for falls per time of 15 sec TUG test Baseline: 16 sec Goal status: NOT MET  3.  Demo improved BLE strength per time of 25 sec 5xSTS test Baseline: 16 sec Goal status: MET   ASSESSMENT:  CLINICAL IMPRESSION: POC details reviewed with improved performance Berg Balance Test indicating low risk for falls and improved performances with TUG test and 5xSTS.  Pt provided with relevant exercise physiology education for optimal load and dose to improve strength and avoiding over-exertion/fatigue.  Demonstrates excellent return demonstration of activities and principles and will D/C to HEP at this time.  Pt provided with script and statement from MD for AFO intervention to improve safety with transfers and gait.   OBJECTIVE IMPAIRMENTS: Abnormal gait, decreased activity tolerance, decreased balance, decreased endurance, decreased knowledge of use of DME, decreased mobility, difficulty walking, decreased ROM, decreased strength, impaired flexibility, impaired tone, and pain.   ACTIVITY LIMITATIONS: carrying, lifting, bending, standing, stairs, transfers, reach over head, and locomotion level  PARTICIPATION LIMITATIONS: meal prep, cleaning, shopping, community activity, and exercise routine  PERSONAL FACTORS: Past/current experiences and Time since  onset of injury/illness/exacerbation are also affecting patient's functional outcome.   REHAB POTENTIAL: Good  CLINICAL DECISION MAKING: Evolving/moderate complexity  EVALUATION COMPLEXITY: Moderate  PLAN:  PT FREQUENCY: 1x/week  PT DURATION: 6 weeks  PLANNED INTERVENTIONS: Therapeutic exercises, Therapeutic activity, Neuromuscular re-education, Balance training, Gait training, Patient/Family education, Self Care, Joint mobilization, Stair training, Vestibular training, Canalith repositioning, Orthotic/Fit training, DME instructions, Aquatic Therapy, Dry Needling, Electrical stimulation, Spinal mobilization, Cryotherapy, Moist heat, and Manual therapy  PLAN FOR NEXT SESSION: D/C to HEP   7:58 AM, 03/02/23 M. Shary Decamp, PT, DPT Physical Therapist- [place] Office Number: [phone]

## 2023-03-05 ENCOUNTER — Other Ambulatory Visit: Payer: Self-pay | Admitting: Neurology

## 2023-03-08 NOTE — Telephone Encounter (Signed)
Last seen on 01/13/23 Follow up scheduled on 04/27/23

## 2023-03-09 ENCOUNTER — Ambulatory Visit: Payer: Medicare Other

## 2023-03-09 MED ORDER — AMPHETAMINE-DEXTROAMPHET ER 20 MG PO CP24: 20.0000 mg | ORAL_CAPSULE | Freq: Every day | ORAL | 0 refills | Status: DC

## 2023-03-09 NOTE — Telephone Encounter (Signed)
Patient last seen on 01/13/23 Follow up scheduled on 04/27/23 Last filled on 02/09/23 #30 tablets (30 day supply) Rx pending to be signed

## 2023-03-09 NOTE — Addendum Note (Signed)
Addended by: Aura Camps on: 03/09/2023 01:03 PM   Modules accepted: Orders

## 2023-03-15 ENCOUNTER — Encounter (HOSPITAL_BASED_OUTPATIENT_CLINIC_OR_DEPARTMENT_OTHER): Payer: Self-pay | Admitting: Otolaryngology

## 2023-03-16 ENCOUNTER — Encounter (HOSPITAL_BASED_OUTPATIENT_CLINIC_OR_DEPARTMENT_OTHER): Payer: Self-pay | Admitting: Otolaryngology

## 2023-03-16 ENCOUNTER — Other Ambulatory Visit: Payer: Self-pay

## 2023-03-19 ENCOUNTER — Other Ambulatory Visit: Payer: Self-pay | Admitting: Otolaryngology

## 2023-03-23 ENCOUNTER — Encounter (HOSPITAL_BASED_OUTPATIENT_CLINIC_OR_DEPARTMENT_OTHER)
Admission: RE | Disposition: A | Discharge status: Home / Self Care | Payer: Self-pay | Source: Home / Self Care | Attending: Otolaryngology

## 2023-03-23 ENCOUNTER — Ambulatory Visit (HOSPITAL_BASED_OUTPATIENT_CLINIC_OR_DEPARTMENT_OTHER): Payer: Medicare Other | Admitting: Anesthesiology

## 2023-03-23 ENCOUNTER — Ambulatory Visit (HOSPITAL_BASED_OUTPATIENT_CLINIC_OR_DEPARTMENT_OTHER)
Admission: RE | Admit: 2023-03-23 | Discharge: 2023-03-23 | Disposition: A | Discharge status: Home / Self Care | Payer: Medicare Other | Attending: Otolaryngology | Admitting: Otolaryngology

## 2023-03-23 ENCOUNTER — Other Ambulatory Visit: Payer: Self-pay

## 2023-03-23 ENCOUNTER — Encounter (HOSPITAL_BASED_OUTPATIENT_CLINIC_OR_DEPARTMENT_OTHER): Payer: Self-pay | Admitting: Otolaryngology

## 2023-03-23 DIAGNOSIS — Z01818 Encounter for other preprocedural examination: Secondary | ICD-10-CM

## 2023-03-23 DIAGNOSIS — F419 Anxiety disorder, unspecified: Secondary | ICD-10-CM | POA: Diagnosis not present

## 2023-03-23 DIAGNOSIS — F32A Depression, unspecified: Secondary | ICD-10-CM | POA: Insufficient documentation

## 2023-03-23 DIAGNOSIS — I10 Essential (primary) hypertension: Secondary | ICD-10-CM | POA: Insufficient documentation

## 2023-03-23 DIAGNOSIS — G4733 Obstructive sleep apnea (adult) (pediatric): Secondary | ICD-10-CM | POA: Diagnosis not present

## 2023-03-23 DIAGNOSIS — Z789 Other specified health status: Secondary | ICD-10-CM

## 2023-03-23 HISTORY — PX: DRUG INDUCED ENDOSCOPY: SHX6808

## 2023-03-23 HISTORY — DX: Essential (primary) hypertension: I10

## 2023-03-23 HISTORY — DX: Depression, unspecified: F32.A

## 2023-03-23 HISTORY — DX: Unspecified hearing loss, unspecified ear: H91.90

## 2023-03-23 HISTORY — DX: Sleep apnea, unspecified: G47.30

## 2023-03-23 SURGERY — DRUG INDUCED SLEEP ENDOSCOPY
Anesthesia: Monitor Anesthesia Care | Site: Nose

## 2023-03-23 MED ORDER — ACETAMINOPHEN 10 MG/ML IV SOLN: 1000.0000 mg | Freq: Once | INTRAVENOUS | Status: DC | PRN

## 2023-03-23 MED ORDER — OXYMETAZOLINE HCL 0.05 % NA SOLN
NASAL | Status: AC
  Filled 2023-03-23: qty 30

## 2023-03-23 MED ORDER — KETOROLAC TROMETHAMINE 30 MG/ML IJ SOLN: 30.0000 mg | Freq: Once | INTRAMUSCULAR | Status: DC | PRN

## 2023-03-23 MED ORDER — LIDOCAINE 2% (20 MG/ML) 5 ML SYRINGE
INTRAMUSCULAR | Status: AC
  Filled 2023-03-23: qty 5

## 2023-03-23 MED ORDER — OXYCODONE HCL 5 MG PO TABS: 5.0000 mg | ORAL_TABLET | Freq: Once | ORAL | Status: DC | PRN

## 2023-03-23 MED ORDER — LACTATED RINGERS IV SOLN: INTRAVENOUS | Status: DC

## 2023-03-23 MED ORDER — PROPOFOL 500 MG/50ML IV EMUL
INTRAVENOUS | Status: DC | PRN
  Administered 2023-03-23: 100 ug/kg/min via INTRAVENOUS

## 2023-03-23 MED ORDER — ONDANSETRON HCL 4 MG/2ML IJ SOLN
INTRAMUSCULAR | Status: DC | PRN
  Administered 2023-03-23: 4 mg via INTRAVENOUS

## 2023-03-23 MED ORDER — LIDOCAINE HCL (CARDIAC) PF 100 MG/5ML IV SOSY
PREFILLED_SYRINGE | INTRAVENOUS | Status: DC | PRN
  Administered 2023-03-23: 50 mg via INTRAVENOUS

## 2023-03-23 MED ORDER — DEXMEDETOMIDINE HCL IN NACL 80 MCG/20ML IV SOLN
INTRAVENOUS | Status: DC | PRN
  Administered 2023-03-23: 12 ug via INTRAVENOUS

## 2023-03-23 MED ORDER — OXYCODONE HCL 5 MG/5ML PO SOLN: 5.0000 mg | Freq: Once | ORAL | Status: DC | PRN

## 2023-03-23 MED ORDER — AMISULPRIDE (ANTIEMETIC) 5 MG/2ML IV SOLN: 10.0000 mg | Freq: Once | INTRAVENOUS | Status: DC | PRN

## 2023-03-23 MED ORDER — FENTANYL CITRATE (PF) 100 MCG/2ML IJ SOLN: 25.0000 ug | INTRAMUSCULAR | Status: DC | PRN

## 2023-03-23 MED ORDER — OXYMETAZOLINE HCL 0.05 % NA SOLN
NASAL | Status: DC | PRN
  Administered 2023-03-23: 1 via TOPICAL

## 2023-03-23 SURGICAL SUPPLY — 13 items
ANTIFOG SOL W/FOAM PAD STRL (MISCELLANEOUS) ×1
CANISTER SUCT 1200ML W/VALVE (MISCELLANEOUS) ×1 IMPLANT
GLOVE BIO SURGEON STRL SZ7.5 (GLOVE) ×1 IMPLANT
GLOVE BIOGEL PI IND STRL 7.5 (GLOVE) IMPLANT
KIT CLEAN ENDO (MISCELLANEOUS) ×1 IMPLANT
NDL HYPO 27GX1-1/4 (NEEDLE) IMPLANT
NEEDLE HYPO 27GX1-1/4 (NEEDLE)
PATTIES SURGICAL .5 X3 (DISPOSABLE) ×1 IMPLANT
SHEET MEDIUM DRAPE 40X70 STRL (DRAPES) ×1 IMPLANT
SOLUTION ANTFG W/FOAM PAD STRL (MISCELLANEOUS) ×1 IMPLANT
SYR CONTROL 10ML LL (SYRINGE) IMPLANT
TOWEL GREEN STERILE FF (TOWEL DISPOSABLE) ×1 IMPLANT
TUBE CONNECTING 20X1/4 (TUBING) IMPLANT

## 2023-03-23 NOTE — Brief Op Note (Signed)
03/23/2023  12:46 PM  PATIENT:  Tommy Ellison  47 y.o. male  PRE-OPERATIVE DIAGNOSIS:  Obstructive sleep apnea  POST-OPERATIVE DIAGNOSIS:  same  PROCEDURE:  Procedure(s): DRUG INDUCED SLEEP ENDOSCOPY (N/A)  SURGEON:  Surgeons and Role:    Christia Reading, MD - Primary  PHYSICIAN ASSISTANT:   ASSISTANTS: none   ANESTHESIA:   IV sedation  EBL:  0 mL   BLOOD ADMINISTERED:none  DRAINS: none   LOCAL MEDICATIONS USED:  NONE  SPECIMEN:  No Specimen  DISPOSITION OF SPECIMEN:  N/A  COUNTS:  YES  TOURNIQUET:  * No tourniquets in log *  DICTATION: .Note written in EPIC  PLAN OF CARE: Discharge to home after PACU  PATIENT DISPOSITION:  PACU - hemodynamically stable.   Delay start of Pharmacological VTE agent (>24hrs) due to surgical blood loss or risk of bleeding: no

## 2023-03-23 NOTE — Anesthesia Preprocedure Evaluation (Addendum)
Anesthesia Evaluation  Patient identified by MRN, date of birth, ID band Patient awake    Reviewed: Allergy & Precautions, NPO status , Patient's Chart, lab work & pertinent test results  Airway Mallampati: II  TM Distance: >3 FB Neck ROM: Full    Dental no notable dental hx.    Pulmonary sleep apnea    Pulmonary exam normal        Cardiovascular hypertension, Pt. on home beta blockers Normal cardiovascular exam     Neuro/Psych  PSYCHIATRIC DISORDERS Anxiety Depression    Deaf  Neuromuscular disease    GI/Hepatic negative GI ROS, Neg liver ROS,,,  Endo/Other  negative endocrine ROS    Renal/GU negative Renal ROS     Musculoskeletal negative musculoskeletal ROS (+)    Abdominal   Peds  Hematology negative hematology ROS (+)   Anesthesia Other Findings Obstructive sleep apnea  Reproductive/Obstetrics                             Anesthesia Physical Anesthesia Plan  ASA: 2  Anesthesia Plan: MAC   Post-op Pain Management:    Induction: Intravenous  PONV Risk Score and Plan: 1 and Propofol infusion and Treatment may vary due to age or medical condition  Airway Management Planned: Natural Airway  Additional Equipment:   Intra-op Plan:   Post-operative Plan:   Informed Consent: I have reviewed the patients History and Physical, chart, labs and discussed the procedure including the risks, benefits and alternatives for the proposed anesthesia with the patient or authorized representative who has indicated his/her understanding and acceptance.     Dental advisory given  Plan Discussed with: CRNA  Anesthesia Plan Comments:        Anesthesia Quick Evaluation

## 2023-03-23 NOTE — Anesthesia Procedure Notes (Signed)
Procedure Name: MAC Date/Time: 03/23/2023 12:29 PM  Performed by: Cleda Clarks, CRNAPre-anesthesia Checklist: Patient identified, Emergency Drugs available, Suction available, Patient being monitored and Timeout performed Patient Re-evaluated:Patient Re-evaluated prior to induction Oxygen Delivery Method: Simple face mask Preoxygenation: Pre-oxygenation with 100% oxygen Placement Confirmation: positive ETCO2

## 2023-03-23 NOTE — H&P (Signed)
Tommy Ellison is an 47 y.o. male.   Chief Complaint: Sleep apnea HPI: 47 year old male with sleep apnea who has been unable to tolerate CPAP.  Past Medical History:  Diagnosis Date   Anxiety    Depression    Hearing loss    Hypersomnia    Hypertension    Rhinitis    Sleep apnea     Past Surgical History:  Procedure Laterality Date   COCHLEAR IMPLANT  03/06/2021   TOE SURGERY     WISDOM TOOTH EXTRACTION      Family History  Problem Relation Age of Onset   Arthritis Other    Depression Mother    Social History:  reports that he has never smoked. He has never used smokeless tobacco. He reports current alcohol use. He reports that he does not use drugs.  Allergies: No Known Allergies  Medications Prior to Admission  Medication Sig Dispense Refill   acetaminophen (TYLENOL) 500 MG tablet Take 500 mg by mouth every 6 (six) hours as needed.     amphetamine-dextroamphetamine (ADDERALL XR) 20 MG 24 hr capsule Take 1 capsule (20 mg total) by mouth daily. 30 capsule 0   Cholecalciferol (VITAMIN D3 PO) Take 5,000 Units by mouth daily.     citalopram (CELEXA) 20 MG tablet Take 1 tablet (20 mg total) by mouth daily. 90 tablet 1   Cyanocobalamin (VITAMIN B-12 PO) Take 1,000 mcg by mouth daily.     etodolac (LODINE) 400 MG tablet TAKE 1 TABLET (400 MG TOTAL) BY MOUTH 2 (TWO) TIMES DAILY. SCHEDULE FOLLOW UP FOR FUTURE REFILLS 60 tablet 5   famotidine (PEPCID) 40 MG tablet Take 1 tablet (40 mg total) by mouth daily. 30 tablet 3   ferrous sulfate 325 (65 FE) MG tablet Take 325 mg by mouth 2 (two) times daily with a meal.     FOLIC ACID PO Take 800 mcg by mouth daily.     gabapentin (NEURONTIN) 600 MG tablet TAKE 1 TABLET BY MOUTH THREE TIMES A DAY 90 tablet 0   lamoTRIgine (LAMICTAL) 100 MG tablet One po bid 60 tablet 5   metoprolol succinate (TOPROL-XL) 25 MG 24 hr tablet Take 1 tablet (25 mg total) by mouth daily. 90 tablet 1   Multiple Vitamin (MULTIVITAMIN) tablet Take 1 tablet by mouth  daily.     nortriptyline (PAMELOR) 25 MG capsule Take 2 capsules (50 mg total) by mouth at bedtime. 180 capsule 4   tadalafil (CIALIS) 20 MG tablet Take 1 tablet (20 mg total) by mouth daily as needed for erectile dysfunction. 12 tablet 5   Ferrous Sulfate (IRON PO) Take 65 mg by mouth 2 (two) times daily. (Patient not taking: Reported on 01/13/2023)     ondansetron (ZOFRAN) 8 MG tablet Take 1 tablet (8 mg total) by mouth every 8 (eight) hours as needed for nausea or vomiting. 20 tablet 3    No results found for this or any previous visit (from the past 48 hour(s)). No results found.  Review of Systems  All other systems reviewed and are negative.   Pulse (!) 109, temperature (!) 97.2 F (36.2 C), temperature source Temporal, resp. rate 16, height 5\' 9"  (1.753 m), weight 84.9 kg, SpO2 99%. Physical Exam Constitutional:      Appearance: Normal appearance. He is normal weight.  HENT:     Head: Normocephalic and atraumatic.     Right Ear: External ear normal.     Left Ear: External ear normal.  Nose: Nose normal.     Mouth/Throat:     Mouth: Mucous membranes are moist.     Pharynx: Oropharynx is clear.  Eyes:     Extraocular Movements: Extraocular movements intact.     Conjunctiva/sclera: Conjunctivae normal.     Pupils: Pupils are equal, round, and reactive to light.  Cardiovascular:     Rate and Rhythm: Normal rate.  Pulmonary:     Effort: Pulmonary effort is normal.  Musculoskeletal:     Cervical back: Normal range of motion.  Skin:    General: Skin is warm and dry.  Neurological:     General: No focal deficit present.     Mental Status: He is alert and oriented to person, place, and time.  Psychiatric:        Mood and Affect: Mood normal.        Behavior: Behavior normal.        Thought Content: Thought content normal.        Judgment: Judgment normal.      Assessment/Plan Obstructive sleep apnea and BMI 27.64.  To OR for sleep endoscopy.  Christia Reading,  MD 03/23/2023, 12:01 PM

## 2023-03-23 NOTE — Anesthesia Postprocedure Evaluation (Signed)
Anesthesia Post Note  Patient: Tommy Ellison  Procedure(s) Performed: DRUG INDUCED SLEEP ENDOSCOPY (Nose)     Patient location during evaluation: PACU Anesthesia Type: MAC Level of consciousness: awake Pain management: pain level controlled Vital Signs Assessment: post-procedure vital signs reviewed and stable Respiratory status: spontaneous breathing, nonlabored ventilation and respiratory function stable Cardiovascular status: blood pressure returned to baseline and stable Postop Assessment: no apparent nausea or vomiting Anesthetic complications: no   No notable events documented.  Last Vitals:  Vitals:   03/23/23 1101 03/23/23 1256  BP:  (!) 139/93  Pulse: (!) 109 98  Resp: 16 15  Temp: (!) 36.2 C 36.5 C  SpO2: 99% 98%    Last Pain:  Vitals:   03/23/23 1256  TempSrc:   PainSc: 0-No pain                 Davarius Ridener P Angad Nabers

## 2023-03-23 NOTE — Discharge Instructions (Signed)

## 2023-03-23 NOTE — Transfer of Care (Signed)
Immediate Anesthesia Transfer of Care Note  Patient: Tommy Ellison  Procedure(s) Performed: DRUG INDUCED SLEEP ENDOSCOPY (Nose)  Patient Location: PACU  Anesthesia Type:MAC  Level of Consciousness: awake, alert , and oriented  Airway & Oxygen Therapy: Patient Spontanous Breathing and Patient connected to nasal cannula oxygen  Post-op Assessment: Report given to RN and Post -op Vital signs reviewed and stable  Post vital signs: Reviewed and stable  Last Vitals:  Vitals Value Taken Time  BP    Temp    Pulse    Resp    SpO2      Last Pain:  Vitals:   03/23/23 1101  TempSrc: Temporal  PainSc: 3       Patients Stated Pain Goal: 3 (03/23/23 1101)  Complications: No notable events documented.

## 2023-03-23 NOTE — Op Note (Signed)
Preop diagnosis: Obstructive sleep apnea ?Postop diagnosis: same ?Procedure: Drug-induced sleep endoscopy ?Surgeon: Ashira Kirsten ?Anesth: IV sedation ?Compl: None ?Findings: There is 100% anterior-posterior collapse at the velum making him a candidate for hypoglossal nerve stimulator placement.  There was also anterior-posterior collapse at the tongue base. ?Description:  After discussing risks, benefits, and alternatives, the patient was brought to the operative suite and placed on the operative table in the supine position.  Anesthesia was induced and the patient was given light sedation to simulate natural sleep. When the proper level was reached, an Afrin-soaked pledget was placed in the right nasal passage for a couple of minutes and then removed.  The fiberoptic laryngoscope was then passed to view the pharynx and larynx.  Findings are noted above and the exam was recorded.  After completion, the scope was removed and the patient was returned to anesthesia for wakeup and was moved to the recovery room in stable condition. ? ?

## 2023-03-24 ENCOUNTER — Encounter (HOSPITAL_BASED_OUTPATIENT_CLINIC_OR_DEPARTMENT_OTHER): Payer: Self-pay | Admitting: Otolaryngology

## 2023-03-29 ENCOUNTER — Encounter: Payer: Self-pay | Admitting: Neurology

## 2023-03-29 ENCOUNTER — Telehealth: Payer: Self-pay | Admitting: Neurology

## 2023-03-29 NOTE — Telephone Encounter (Signed)
Letter faxed, received fax confirmation

## 2023-03-29 NOTE — Telephone Encounter (Signed)
[email or web address](Dr Jenne Pane) is calling stating Dr Jenne Pane would like to know if pt's Neurologic condition would prevent him from being a canidate for Inspire.  [place] states Dr Epimenio Foot can either call Dr Jenne Pane to discuss or send a letter or brief note with a response.  The # to call is [phone] fax# for Dr Jenne Pane is [phone]

## 2023-04-08 ENCOUNTER — Other Ambulatory Visit: Payer: Self-pay | Admitting: Neurology

## 2023-04-08 MED ORDER — GABAPENTIN 600 MG PO TABS: 600.0000 mg | ORAL_TABLET | Freq: Three times a day (TID) | ORAL | 5 refills | Status: DC

## 2023-04-08 MED ORDER — AMPHETAMINE-DEXTROAMPHET ER 20 MG PO CP24: 20.0000 mg | ORAL_CAPSULE | Freq: Every day | ORAL | 0 refills | Status: DC

## 2023-04-08 NOTE — Telephone Encounter (Signed)
I spoke to [place], as pt is deaf.  See other note.

## 2023-04-08 NOTE — Telephone Encounter (Signed)
Lamotrigine has refills available.  Pt has appt 04-27-2023, last fill adderall 03-12-2023 #30, gabapentin 10-14-202 #90

## 2023-04-14 ENCOUNTER — Encounter: Payer: Self-pay | Admitting: Neurology

## 2023-04-14 NOTE — Telephone Encounter (Signed)
Called [facility] at [phone]. Spoke w/ tech. They state pt used old rx# to request refill and that is why he got old rx for 1 po at bedtime #90. He will need to use this up before getting updated rx filled.

## 2023-04-17 IMAGING — XA DG SPINAL PUNCT LUMBAR DIAG WITH FL CT GUIDANCE
1 series · 1 of 1 positions shown · non-contrast
Comparison: None.

CLINICAL DATA: Progressive leg numbness and gait difficulties.

EXAM:
DIAGNOSTIC LUMBAR PUNCTURE UNDER FLUOROSCOPIC GUIDANCE

[Series 1: ortho adipose · 1 of 1 slices shown]
[im 1/1]
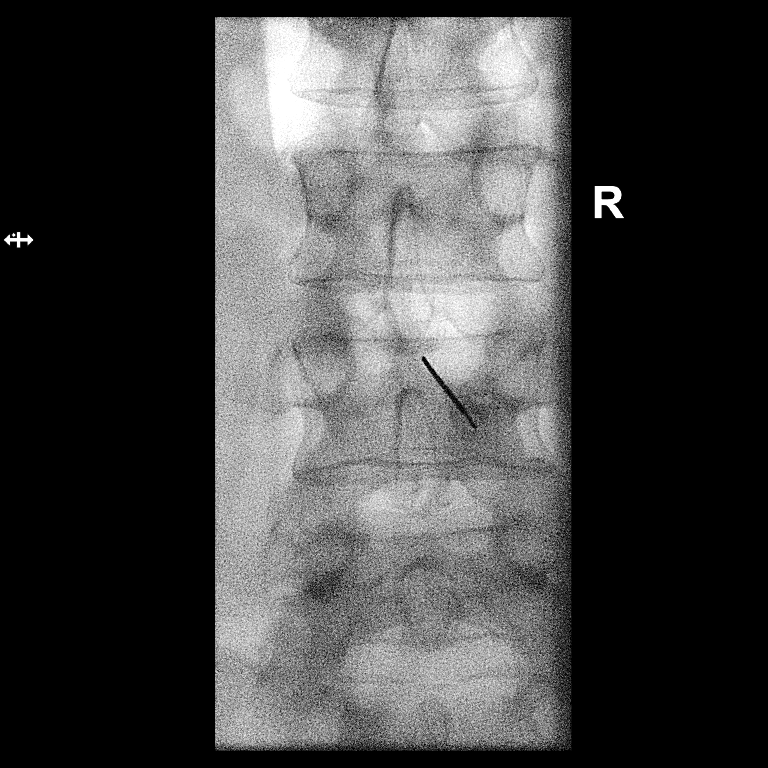

[1 of 1 positions shown; findings below may reference images not displayed]

FLUOROSCOPY TIME:  Fluoroscopy Time:  19 seconds

Radiation Exposure Index (if provided by the fluoroscopic device):
2.0 mGy

Number of Acquired Spot Images: 0

PROCEDURE:
Informed consent was obtained from the patient prior to the
procedure, including potential complications of headache, allergy,
and pain. With the patient in the left lateral decubitus position,
the lower back was prepped with Betadine. 1% Lidocaine was used for
local anesthesia. Lumbar puncture was performed at the L3-L4 level
via a right interlaminar approach using a 3.5 inch 20 gauge needle
with return of clear, colorless CSF with an opening pressure of 12
cm water. 4 ml of CSF were obtained for laboratory studies. The
patient tolerated the procedure well and there were no apparent
complications.
IMPRESSION: Technically successful fluoroscopically guided lumbar puncture.

## 2023-04-27 ENCOUNTER — Encounter: Payer: Self-pay | Admitting: Neurology

## 2023-04-27 ENCOUNTER — Ambulatory Visit (INDEPENDENT_AMBULATORY_CARE_PROVIDER_SITE_OTHER): Payer: Medicare Other | Admitting: Neurology

## 2023-04-27 VITALS — BP 135/92 | HR 105 | Ht 69.0 in | Wt 190.0 lb

## 2023-04-27 DIAGNOSIS — R200 Anesthesia of skin: Secondary | ICD-10-CM

## 2023-04-27 DIAGNOSIS — G4733 Obstructive sleep apnea (adult) (pediatric): Secondary | ICD-10-CM | POA: Diagnosis not present

## 2023-04-27 DIAGNOSIS — G6181 Chronic inflammatory demyelinating polyneuritis: Secondary | ICD-10-CM | POA: Diagnosis not present

## 2023-04-27 DIAGNOSIS — R29898 Other symptoms and signs involving the musculoskeletal system: Secondary | ICD-10-CM | POA: Diagnosis not present

## 2023-04-27 DIAGNOSIS — M21379 Foot drop, unspecified foot: Secondary | ICD-10-CM

## 2023-04-27 DIAGNOSIS — R269 Unspecified abnormalities of gait and mobility: Secondary | ICD-10-CM

## 2023-04-27 DIAGNOSIS — G47419 Narcolepsy without cataplexy: Secondary | ICD-10-CM

## 2023-04-27 MED ORDER — LAMOTRIGINE 150 MG PO TABS: 150.0000 mg | ORAL_TABLET | Freq: Every day | ORAL | 11 refills | Status: DC

## 2023-04-27 NOTE — Progress Notes (Addendum)
 [facility]  PATIENT: Tommy Ellison DOB: Oct 24, 1975  REFERRING DOCTOR OR PCP: Henrine Screws, MD SOURCE: Patient, notes from primary care  _________________________________   HISTORICAL  CHIEF COMPLAINT:  Chief Complaint  Patient presents with   Follow-up    Pt in room 10 wife in room. Here for polyneuropathy. Pt said feet has gotten worsen in last couple months, legs feel better. Both hands feel better. Does not use cpap machine.     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      Updat 04/27/2023 He feels strength and sensation are doing better.  He still has some weakness in his feet.  He has a foot drop when he walks.  However he is still sometimes able to go up up to half mile.  He does not keep up with others due to the foot drop.   Sensation is better but he has a lot of foot pain still.     Due to transportation issues (he does not drive), he has found it difficult to do the IVIG.  Additionally, blood pressure sometimes increases during the infusions.  His IVIG dose is  130 g (65 g x 2 days) every 4 weeks.  He generally feels better for 3 weeks after each IVIG but then the 4th week he is more numb, more off balanced and has much more pain.  He also has dysesthetic pain in the feet with both a cold sensation and pins and needle sensation.   Before the IVIG we had tried prednisone without benefit.  He takes gabapentin 3 times a day with some benefit  Tramadol just helped a little bit.     OSA/narcolepsy:   HST 08/13/2021 showed moderate OSA with AHI 24.8/hr, REM AHI 33.7/hr and O2 nadir 85%. AutoPAP was tried but he had poor compliance.     APAP upset his stomach and he swallowed a lot of air.   .  When he did not have nausea, he felt better using CPAP.   He has dreams of animals attacking him.   He takes naps x 2 for 1 hour each.   He has had a few spells of sleep paralysis but has not  had cataplexy (no spells associated with emotion).     Deafness: He now has a cochlear implant and is able to hear a little bit ;   History of neuropathy:  He had generally been in good health until 2020.  He became deaf over a few days in July 2020. However hearing had declined some in preceding month (mildly muffled).   He also became more ubbalanced that month and fell 12/03/2020.     He has had ED since 2020 as well.  Dysesthesias and numbness began around September or October 2020.  Balance worsened around that time as well and he had a fall at that time.  He feels symptoms have progressively worsened since the onset.   He notes mild weakness in his legs but not in his hands.      He notes no bladder changes.  He has a history of moderate alcohol abuse but drinks much less over the past couple of years.      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    CSF did not show elevated protein or  any other abnormality.   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      Labs 11/02/2019.  SPEP/IEF showed a polyclonal increase in IgM.  SSA/SSB, cryoglobulins and thiamine were normal.   Anti-mag IgM was negative.  Hepatitis labs were negative.      I reviewed lab work from primary care.  B12 and TSH were normal.  He has no family history of polyneuropathy that he is aware of.  REVIEW OF SYSTEMS: Constitutional: No fevers, chills, sweats, or change in appetite Eyes: No visual changes, double vision, eye pain it looks like the medicine is only $9 for 30 pills with good Rx it looks like the medicine is only $9 at [facility] with good Rx it if there is a Medicare appeal or preauthorization we can do that. Ear, nose and throat: No hearing loss, ear pain, nasal  congestion, sore throat Cardiovascular: No chest pain, palpitations Respiratory:  No shortness of breath at rest or with exertion.   No wheezes GastrointestinaI: No nausea, vomiting, diarrhea, abdominal pain, fecal incontinence Genitourinary:  No dysuria, urinary retention or frequency.  No nocturia. Musculoskeletal:  No neck pain, back pain Integumentary: No rash, pruritus, skin lesions Neurological: as above Psychiatric: No depression at this time.  No anxiety Endocrine: No palpitations, diaphoresis, change in appetite, change in weigh or increased thirst Hematologic/Lymphatic:  No anemia, purpura, petechiae. Allergic/Immunologic: No itchy/runny eyes, nasal congestion, recent allergic reactions, rashes  ALLERGIES: No Known Allergies  HOME MEDICATIONS:  Current Outpatient Medications:    acetaminophen (TYLENOL) 500 MG tablet, Take 500 mg by mouth every 6 (six) hours as needed., Disp: , Rfl:    amphetamine-dextroamphetamine (ADDERALL XR) 20 MG 24 hr capsule, Take 1 capsule (20 mg total) by mouth daily., Disp: 30 capsule, Rfl: 0   Cholecalciferol (VITAMIN D3 PO), Take 5,000 Units by mouth daily., Disp: , Rfl:    citalopram (CELEXA) 20 MG tablet, Take 1 tablet (20 mg total) by mouth daily., Disp: 90 tablet, Rfl: 1   Cyanocobalamin (VITAMIN B-12 PO), Take 1,000 mcg by mouth daily., Disp: , Rfl:    etodolac (LODINE) 400 MG tablet, TAKE 1 TABLET (400 MG TOTAL) BY MOUTH 2 (TWO) TIMES DAILY. SCHEDULE FOLLOW UP FOR FUTURE REFILLS, Disp: 60 tablet, Rfl: 5   Ferrous Sulfate (IRON PO), Take 65 mg by mouth 2 (two) times daily., Disp: , Rfl:    ferrous sulfate 325 (65 FE) MG tablet, Take 325 mg by mouth 2 (two) times daily with a meal., Disp: , Rfl:    FOLIC ACID PO, Take 800 mcg by mouth daily., Disp: , Rfl:    lamoTRIgine (LAMICTAL) 150 MG tablet, Take 1 tablet (150 mg total) by mouth daily., Disp: 60 tablet, Rfl: 11   metoprolol succinate (TOPROL-XL) 25 MG 24 hr tablet, Take 1 tablet (25 mg total)  by mouth daily., Disp: 90 tablet, Rfl: 1   Multiple Vitamin (MULTIVITAMIN) tablet, Take 1 tablet by mouth daily., Disp: , Rfl:    nortriptyline (PAMELOR) 25 MG capsule, Take 2 capsules (50 mg total) by mouth at bedtime., Disp: 180 capsule, Rfl: 4   ondansetron (ZOFRAN) 8 MG tablet, Take 1 tablet (8 mg total) by mouth every 8 (eight) hours as needed for nausea or vomiting., Disp: 20 tablet, Rfl: 3   pantoprazole (PROTONIX) 40 MG tablet, Take 40 mg by mouth daily., Disp: , Rfl:    tadalafil (CIALIS) 20 MG tablet,  Take 1 tablet (20 mg total) by mouth daily as needed for erectile dysfunction., Disp: 12 tablet, Rfl: 5   famotidine (PEPCID) 40 MG tablet, Take 1 tablet (40 mg total) by mouth daily. (Patient not taking: Reported on 04/27/2023), Disp: 30 tablet, Rfl: 3   gabapentin (NEURONTIN) 600 MG tablet, TAKE 1 TABLET BY MOUTH THREE TIMES A DAY, Disp: 90 tablet, Rfl: 0  PAST MEDICAL HISTORY: Past Medical History:  Diagnosis Date   Anxiety    Depression    Hearing loss    Hypersomnia    Hypertension    Rhinitis    Sleep apnea     PAST SURGICAL HISTORY: Past Surgical History:  Procedure Laterality Date   COCHLEAR IMPLANT  03/06/2021   DRUG INDUCED ENDOSCOPY N/A 03/23/2023   Procedure: DRUG INDUCED SLEEP ENDOSCOPY;  Surgeon: Christia Reading, MD;  Location: [facility];  Service: ENT;  Laterality: N/A;   TOE SURGERY     WISDOM TOOTH EXTRACTION      FAMILY HISTORY: Family History  Problem Relation Age of Onset   Arthritis Other    Depression Mother     SOCIAL HISTORY:  Social History   Socioeconomic History   Marital status: Single    Spouse name: Not on file   Number of children: Not on file   Years of education: Not on file   Highest education level: Not on file  Occupational History   Not on file  Tobacco Use   Smoking status: Never   Smokeless tobacco: Never  Vaping Use   Vaping status: Never Used  Substance and Sexual Activity   Alcohol use: Yes     Comment: daily    Drug use: No   Sexual activity: Not on file  Other Topics Concern   Not on file  Social History Narrative   Right handed   2 cokes per day   Social Determinants of Health   Financial Resource Strain: Not on file  Food Insecurity: Low Risk  (02/08/2023)   Received from [facility]   Hunger Vital Sign    Worried About Running Out of Food in the Last Year: Never true    Ran Out of Food in the Last Year: Never true  Transportation Needs: No Transportation Needs (02/08/2023)   Received from [facility]    In the past 12 months, has lack of reliable transportation kept you from medical appointments, meetings, work or from getting things needed for daily living? : No  Physical Activity: Not on file  Stress: Not on file  Social Connections: Not on file  Intimate Partner Violence: Not on file     PHYSICAL EXAM  Vitals:   04/27/23 1631  BP: (!) 135/92  Pulse: (!) 105  Weight: 190 lb (86.2 kg)  Height: 5\' 9"  (1.753 m)     Body mass index is 28.06 kg/m.   General: The patient is well-developed and well-nourished and in no acute distress  HEENT:  Head is [place]/AT.  Sclera are anicteric.   Neck/musculoskeletal: The neck is nontender with good range of motion.  He has some tenderness over the lumbar spine and paraspinal muscles  Skin: Extremities are without rash or  edema.  Neurologic Exam  Mental status: The patient is alert and oriented x 3 at the time of the examination. The patient has apparent normal recent and remote memory, with an apparently normal attention span and concentration ability.   Speech is normal  Cranial nerves: Extraocular  movements are full. Pupils are small and not definitely reactive.  Facial strength and sensation was normal.  Trapezius and sternocleidomastoid strength is normal. No dysarthria is noted.   He is deaf  Motor:  Muscle bulk is normal.   Tone is normal. Strength is  5 / 5 in all 4 extremities except 4/5  in the anke and toe extensors.   .   Sensory: He has normal vibration sensation  in the knees and fingertips relative to the proximal arms.  He has only 10-20% sensation at the ankles and < 10% vibration sensation at the toes.  There is reduced pinprick sensation below the knee worse in the feet  Coordination: Cerebellar testing reveals good finger-nose-finger and heel-to-shin bilaterally.  Gait and station: Station is normal.   Gait is wide.. Cannot tandem walk.  .  Romberg is borderline..   Reflexes: Deep tendon reflexes are symmetric and normal in the arms but absent in the legs.   .      ASSESSMENT AND PLAN  CIDP (chronic inflammatory demyelinating polyneuropathy) (HCC)  OSA on CPAP  Leg weakness, bilateral  Numbness  Primary narcolepsy without cataplexy  Foot-drop, unspecified laterality  Gait disorder   1.   He has polyneuropathy.  CSF showed normal protein but EMG/NCV is very consistent with CIDP.  He had benefit from IVIG but transportation for the infusions has become difficult.  Additionally blood pressure increases during the infusions.. We discussed a trial of Vyvgart and he is interested to try this.      2.  He has OSA and currently is not using CPAP.  He has EDS.  He does not have cataplexy but has some other signs of possible narcolepsy..  Nortriptyline and Adderall have helped the sleepiness and the hypnagogic hallucinations.     He saw Dr. Jenne Pane and had a DISE and was felt to be a candidate for the inspire device.-- I discussed with Dr. Vickey Huger who is able to see him before and after implant. 3.    He will continue gabapentin.  Increase lamotrigine to 100 mg po bid 4.  Due to the foot drop caused by his CIDP, he would benefit from obtaining bilateral AFO.  These will help for the foot drops to prevent falls and help him walk longer.    Return to see [place] in 4 months or sooner if there are new or worsening neurologic symptoms or based on the results of the  tests  This visit is part of a comprehensive longitudinal care medical relationship regarding the patients primary diagnosis of CIDP and related concerns.   Ilia Engelbert A. Epimenio Foot, MD, [facility] 04/27/2023, 5:11 PM Certified in Neurology, Clinical Neurophysiology, Sleep Medicine and Neuroimaging  [facility] [address] [place], [place] [address] [phone]

## 2023-04-29 ENCOUNTER — Telehealth: Payer: Self-pay | Admitting: *Deleted

## 2023-04-29 NOTE — Telephone Encounter (Signed)
Per Dr. Epimenio Foot, he wants to transition pt from IVIG to Vyvgart. He will still get IVIG infusion w/ [facility] 05/11/23 and 05/12/23 while we wait to see if Vyvgart is approved w/ [employer].  Faxed completed/signed Vyvgart form to [employer] at [phone]. Received fax confirmation.  Faxed signed orders to [facility] home infusion at  [phone]. Received fax confirmation.  Asked that they call girlfriend, Jennings Books since pt deaf.

## 2023-05-03 ENCOUNTER — Other Ambulatory Visit: Payer: Self-pay | Admitting: Neurology

## 2023-05-03 MED ORDER — AMPHETAMINE-DEXTROAMPHET ER 20 MG PO CP24: 20.0000 mg | ORAL_CAPSULE | Freq: Every day | ORAL | 0 refills | Status: DC

## 2023-05-03 NOTE — Addendum Note (Signed)
Addended by: Eather Colas E on: 05/03/2023 05:40 PM   Modules accepted: Orders

## 2023-05-04 ENCOUNTER — Telehealth: Payer: Self-pay | Admitting: *Deleted

## 2023-05-04 ENCOUNTER — Other Ambulatory Visit: Payer: Self-pay | Admitting: Neurology

## 2023-05-04 NOTE — Telephone Encounter (Signed)
Called pharmacy. Relayed per Dr. Epimenio Foot that [employer] is ok. They verbalized understanding and will process.

## 2023-05-04 NOTE — Telephone Encounter (Signed)
Last seen on 04/27/23 Follow up scheduled on 08/30/23

## 2023-05-05 NOTE — Telephone Encounter (Signed)
Received fax from [employer] that we have to submit auth on CMM. I submitted under Key: [id number]. Waiting on determination from [facility]

## 2023-05-10 ENCOUNTER — Other Ambulatory Visit: Payer: Self-pay | Admitting: Otolaryngology

## 2023-05-13 ENCOUNTER — Telehealth: Payer: Self-pay | Admitting: *Deleted

## 2023-05-13 ENCOUNTER — Other Ambulatory Visit: Payer: Self-pay | Admitting: Neurology

## 2023-05-13 DIAGNOSIS — G4733 Obstructive sleep apnea (adult) (pediatric): Secondary | ICD-10-CM

## 2023-05-27 ENCOUNTER — Telehealth: Payer: Self-pay | Admitting: Neurology

## 2023-05-27 NOTE — Telephone Encounter (Signed)
 This pt was getting IVIG wtih [facility] 12/17 and 12/18 while we waited for [employer] Vyvgart approval, per medicaid below not approved.

## 2023-05-27 NOTE — Telephone Encounter (Signed)
 [facility]) Medicaid will not cover Vyvgart, requesting patient do IVIG. Would like fax a new prescription to have Dr. Epimenio Foot sign.

## 2023-05-31 ENCOUNTER — Encounter (HOSPITAL_BASED_OUTPATIENT_CLINIC_OR_DEPARTMENT_OTHER): Payer: Self-pay | Admitting: Otolaryngology

## 2023-05-31 ENCOUNTER — Other Ambulatory Visit: Payer: Self-pay

## 2023-05-31 NOTE — Telephone Encounter (Signed)
 Per Dr. Vear: Please let him know that Vyvgart was not covered by Medicaid so we will need to continue the IVIG.  I think he is coming in to our office for the infusions.  However, if he qualifies for home health I would be happy to have him do it that way with any other home health agencies (I know transportation has been an issue)  I called and relayed this to his caregiver, Deanna.  She will speak to pt and let me know what he would like to do.  He has appt tomorrow for IVIG with [facility].

## 2023-06-03 NOTE — Telephone Encounter (Addendum)
 Sandy from [employer] walked into the office and said she has been speaking with Khadijah via email and was coming to get the infusion order, no order found.   Sandy emailed the order to me, order printed and Dr.Sater signed given to [place]. Copy signed and placed in folder in file cabinet.

## 2023-06-03 NOTE — Telephone Encounter (Signed)
 Received email form Particia Buster at [facility] about pt doing IVIG at home due to transportation issues and spikes in pt's blood pressure while getting infused at [facility]. Per Dr. Vear he has approved that pt can receive IVIG at home on 06/03/2023. Emailed Sandy from [employer] back about this matter.

## 2023-06-07 ENCOUNTER — Ambulatory Visit (HOSPITAL_COMMUNITY): Payer: Medicare Other

## 2023-06-07 ENCOUNTER — Encounter (HOSPITAL_BASED_OUTPATIENT_CLINIC_OR_DEPARTMENT_OTHER)
Admission: RE | Disposition: A | Discharge status: Home / Self Care | Payer: Self-pay | Source: Home / Self Care | Attending: Otolaryngology

## 2023-06-07 ENCOUNTER — Ambulatory Visit (HOSPITAL_BASED_OUTPATIENT_CLINIC_OR_DEPARTMENT_OTHER)
Admission: RE | Admit: 2023-06-07 | Discharge: 2023-06-07 | Disposition: A | Discharge status: Home / Self Care | Payer: Medicare Other | Attending: Otolaryngology | Admitting: Otolaryngology

## 2023-06-07 ENCOUNTER — Other Ambulatory Visit: Payer: Self-pay

## 2023-06-07 ENCOUNTER — Ambulatory Visit (HOSPITAL_BASED_OUTPATIENT_CLINIC_OR_DEPARTMENT_OTHER): Payer: Medicare Other | Admitting: Certified Registered Nurse Anesthetist

## 2023-06-07 ENCOUNTER — Encounter (HOSPITAL_BASED_OUTPATIENT_CLINIC_OR_DEPARTMENT_OTHER): Payer: Self-pay | Admitting: Otolaryngology

## 2023-06-07 DIAGNOSIS — Z6827 Body mass index (BMI) 27.0-27.9, adult: Secondary | ICD-10-CM | POA: Diagnosis not present

## 2023-06-07 DIAGNOSIS — G4733 Obstructive sleep apnea (adult) (pediatric): Secondary | ICD-10-CM | POA: Insufficient documentation

## 2023-06-07 DIAGNOSIS — Z79899 Other long term (current) drug therapy: Secondary | ICD-10-CM | POA: Diagnosis not present

## 2023-06-07 DIAGNOSIS — K219 Gastro-esophageal reflux disease without esophagitis: Secondary | ICD-10-CM | POA: Diagnosis not present

## 2023-06-07 DIAGNOSIS — F419 Anxiety disorder, unspecified: Secondary | ICD-10-CM | POA: Insufficient documentation

## 2023-06-07 DIAGNOSIS — I10 Essential (primary) hypertension: Secondary | ICD-10-CM | POA: Diagnosis not present

## 2023-06-07 DIAGNOSIS — F32A Depression, unspecified: Secondary | ICD-10-CM | POA: Insufficient documentation

## 2023-06-07 HISTORY — PX: IMPLANTATION OF HYPOGLOSSAL NERVE STIMULATOR: SHX6827

## 2023-06-07 SURGERY — INSERTION, HYPOGLOSSAL NERVE STIMULATOR
Anesthesia: General | Site: Chest | Laterality: Right

## 2023-06-07 MED ORDER — ACETAMINOPHEN 500 MG PO TABS: 1000.0000 mg | ORAL_TABLET | Freq: Once | ORAL | Status: DC

## 2023-06-07 MED ORDER — PROPOFOL 10 MG/ML IV BOLUS
INTRAVENOUS | Status: DC | PRN
  Administered 2023-06-07: 200 mg via INTRAVENOUS

## 2023-06-07 MED ORDER — HYDROCODONE-ACETAMINOPHEN 5-325 MG PO TABS: 1.0000 | ORAL_TABLET | Freq: Four times a day (QID) | ORAL | 0 refills | Status: DC | PRN

## 2023-06-07 MED ORDER — DEXMEDETOMIDINE HCL IN NACL 80 MCG/20ML IV SOLN
INTRAVENOUS | Status: DC | PRN
  Administered 2023-06-07: 8 ug via INTRAVENOUS

## 2023-06-07 MED ORDER — PHENYLEPHRINE HCL-NACL 20-0.9 MG/250ML-% IV SOLN
INTRAVENOUS | Status: DC | PRN
  Administered 2023-06-07: 50 ug/min via INTRAVENOUS

## 2023-06-07 MED ORDER — 0.9 % SODIUM CHLORIDE (POUR BTL) OPTIME
TOPICAL | Status: DC | PRN
  Administered 2023-06-07: 600 mL

## 2023-06-07 MED ORDER — LIDOCAINE 2% (20 MG/ML) 5 ML SYRINGE
INTRAMUSCULAR | Status: AC
  Filled 2023-06-07: qty 5

## 2023-06-07 MED ORDER — PROPOFOL 10 MG/ML IV BOLUS
INTRAVENOUS | Status: AC
  Filled 2023-06-07: qty 20

## 2023-06-07 MED ORDER — LIDOCAINE-EPINEPHRINE 1 %-1:100000 IJ SOLN
INTRAMUSCULAR | Status: DC | PRN
  Administered 2023-06-07: 6 mL

## 2023-06-07 MED ORDER — KETOROLAC TROMETHAMINE 30 MG/ML IJ SOLN
INTRAMUSCULAR | Status: AC
  Filled 2023-06-07: qty 1

## 2023-06-07 MED ORDER — ONDANSETRON HCL 4 MG/2ML IJ SOLN
INTRAMUSCULAR | Status: AC
  Filled 2023-06-07: qty 2

## 2023-06-07 MED ORDER — SUCCINYLCHOLINE CHLORIDE 200 MG/10ML IV SOSY
PREFILLED_SYRINGE | INTRAVENOUS | Status: AC
  Filled 2023-06-07: qty 10

## 2023-06-07 MED ORDER — SODIUM CHLORIDE 0.9 % IV SOLN
INTRAVENOUS | Status: DC | PRN
Start: 2023-06-07 — End: 2023-06-07

## 2023-06-07 MED ORDER — SUCCINYLCHOLINE CHLORIDE 200 MG/10ML IV SOSY
PREFILLED_SYRINGE | INTRAVENOUS | Status: DC | PRN
  Administered 2023-06-07: 100 mg via INTRAVENOUS

## 2023-06-07 MED ORDER — DEXAMETHASONE SODIUM PHOSPHATE 10 MG/ML IJ SOLN
INTRAMUSCULAR | Status: AC
  Filled 2023-06-07: qty 1

## 2023-06-07 MED ORDER — PROPOFOL 500 MG/50ML IV EMUL
INTRAVENOUS | Status: DC | PRN
  Administered 2023-06-07: 75 ug/kg/min via INTRAVENOUS

## 2023-06-07 MED ORDER — FENTANYL CITRATE (PF) 100 MCG/2ML IJ SOLN
INTRAMUSCULAR | Status: AC
  Filled 2023-06-07: qty 2

## 2023-06-07 MED ORDER — MIDAZOLAM HCL 5 MG/5ML IJ SOLN
INTRAMUSCULAR | Status: DC | PRN
  Administered 2023-06-07: 2 mg via INTRAVENOUS

## 2023-06-07 MED ORDER — LIDOCAINE HCL (CARDIAC) PF 100 MG/5ML IV SOSY
PREFILLED_SYRINGE | INTRAVENOUS | Status: DC | PRN
  Administered 2023-06-07: 50 mg via INTRAVENOUS

## 2023-06-07 MED ORDER — ACETAMINOPHEN 10 MG/ML IV SOLN
INTRAVENOUS | Status: DC | PRN
  Administered 2023-06-07: 1000 mg via INTRAVENOUS

## 2023-06-07 MED ORDER — DEXAMETHASONE SODIUM PHOSPHATE 4 MG/ML IJ SOLN
INTRAMUSCULAR | Status: DC | PRN
  Administered 2023-06-07: 10 mg via INTRAVENOUS

## 2023-06-07 MED ORDER — FENTANYL CITRATE (PF) 100 MCG/2ML IJ SOLN
INTRAMUSCULAR | Status: DC | PRN
  Administered 2023-06-07 (×2): 50 ug via INTRAVENOUS

## 2023-06-07 MED ORDER — LACTATED RINGERS IV SOLN: INTRAVENOUS | Status: DC

## 2023-06-07 MED ORDER — ACETAMINOPHEN 10 MG/ML IV SOLN
INTRAVENOUS | Status: AC
  Filled 2023-06-07: qty 100

## 2023-06-07 MED ORDER — ONDANSETRON HCL 4 MG/2ML IJ SOLN
INTRAMUSCULAR | Status: DC | PRN
  Administered 2023-06-07: 4 mg via INTRAVENOUS

## 2023-06-07 MED ORDER — KETOROLAC TROMETHAMINE 30 MG/ML IJ SOLN
INTRAMUSCULAR | Status: DC | PRN
  Administered 2023-06-07: 30 mg via INTRAVENOUS

## 2023-06-07 MED ORDER — CEFAZOLIN SODIUM-DEXTROSE 2-4 GM/100ML-% IV SOLN
2.0000 g | INTRAVENOUS | Status: AC
  Administered 2023-06-07: 2 g via INTRAVENOUS

## 2023-06-07 MED ORDER — MIDAZOLAM HCL 2 MG/2ML IJ SOLN
INTRAMUSCULAR | Status: AC
  Filled 2023-06-07: qty 2

## 2023-06-07 SURGICAL SUPPLY — 64 items
BLADE CLIPPER SURG (BLADE) IMPLANT
BLADE SURG 15 STRL LF DISP TIS (BLADE) ×1 IMPLANT
CANISTER SUCT 1200ML W/VALVE (MISCELLANEOUS) ×1 IMPLANT
CORD BIPOLAR FORCEPS 12FT (ELECTRODE) ×1 IMPLANT
COVER PROBE CYLINDRICAL 5X96 (MISCELLANEOUS) ×1 IMPLANT
DERMABOND ADVANCED .7 DNX12 (GAUZE/BANDAGES/DRESSINGS) ×2 IMPLANT
DRAPE C-ARM 35X43 STRL (DRAPES) ×1 IMPLANT
DRAPE HEAD BAR (DRAPES) IMPLANT
DRAPE INCISE IOBAN 66X45 STRL (DRAPES) ×1 IMPLANT
DRAPE MICROSCOPE WILD 40.5X102 (DRAPES) ×1 IMPLANT
DRAPE UTILITY XL STRL (DRAPES) ×1 IMPLANT
DRSG TEGADERM 2-3/8X2-3/4 SM (GAUZE/BANDAGES/DRESSINGS) ×2 IMPLANT
DRSG TEGADERM 4X4.75 (GAUZE/BANDAGES/DRESSINGS) IMPLANT
ELECT COATED BLADE 2.86 ST (ELECTRODE) ×1 IMPLANT
ELECT EMG 18 NIMS (NEUROSURGERY SUPPLIES) ×1
ELECT REM PT RETURN 9FT ADLT (ELECTROSURGICAL) ×1
ELECTRODE EMG 18 NIMS (NEUROSURGERY SUPPLIES) ×1 IMPLANT
ELECTRODE REM PT RTRN 9FT ADLT (ELECTROSURGICAL) ×1 IMPLANT
FORCEPS BIPOLAR SPETZLER 8 1.0 (NEUROSURGERY SUPPLIES) ×1 IMPLANT
GAUZE 4X4 16PLY ~~LOC~~+RFID DBL (SPONGE) ×1 IMPLANT
GAUZE SPONGE 4X4 12PLY STRL (GAUZE/BANDAGES/DRESSINGS) ×1 IMPLANT
GENERATOR PULSE INSPIRE (Generator) ×1 IMPLANT
GENERATOR PULSE INSPIRE IV (Generator) ×1 IMPLANT
GLOVE BIO SURGEON STRL SZ 6.5 (GLOVE) IMPLANT
GLOVE BIO SURGEON STRL SZ7.5 (GLOVE) ×1 IMPLANT
GLOVE BIOGEL PI IND STRL 7.0 (GLOVE) IMPLANT
GLOVE BIOGEL PI IND STRL 7.5 (GLOVE) IMPLANT
GLOVE SURG SS PI 7.0 STRL IVOR (GLOVE) IMPLANT
GOWN STRL REUS W/ TWL LRG LVL3 (GOWN DISPOSABLE) ×3 IMPLANT
GOWN STRL REUS W/ TWL XL LVL3 (GOWN DISPOSABLE) IMPLANT
IV CATH 18G SAFETY (IV SOLUTION) ×1 IMPLANT
KIT NEURO ACCESSORY W/WRENCH (MISCELLANEOUS) IMPLANT
LEAD SENSING RESP INSPIRE (Lead) ×1 IMPLANT
LEAD SENSING RESP INSPIRE IV (Lead) ×1 IMPLANT
LEAD SLEEP STIM INSPIRE IV/V (Lead) ×1 IMPLANT
LEAD SLEEP STIMULATION INSPIRE (Lead) ×1 IMPLANT
LOOP VASCLR MAXI BLUE 18IN ST (MISCELLANEOUS) ×1 IMPLANT
LOOP VESSEL MINI RED (MISCELLANEOUS) ×1 IMPLANT
MARKER SKIN DUAL TIP RULER LAB (MISCELLANEOUS) ×1 IMPLANT
NDL HYPO 25X1 1.5 SAFETY (NEEDLE) ×1 IMPLANT
NEEDLE HYPO 25X1 1.5 SAFETY (NEEDLE) ×1
NS IRRIG 1000ML POUR BTL (IV SOLUTION) ×1 IMPLANT
PACK BASIN DAY SURGERY FS (CUSTOM PROCEDURE TRAY) ×1 IMPLANT
PACK ENT DAY SURGERY (CUSTOM PROCEDURE TRAY) ×1 IMPLANT
PASSER CATH 36 CODMAN DISP (NEUROSURGERY SUPPLIES) IMPLANT
PASSER CATH 38CM DISP (INSTRUMENTS) IMPLANT
PENCIL SMOKE EVACUATOR (MISCELLANEOUS) ×1 IMPLANT
PROBE NERVE STIMULATOR (NEUROSURGERY SUPPLIES) ×1 IMPLANT
REMOTE CONTROL SLEEP INSPIRE (MISCELLANEOUS) ×1 IMPLANT
SET WALTER ACTIVATION W/DRAPE (SET/KITS/TRAYS/PACK) ×1 IMPLANT
SLEEVE SCD COMPRESS KNEE MED (STOCKING) ×1 IMPLANT
SPONGE INTESTINAL PEANUT (DISPOSABLE) ×1 IMPLANT
SUT SILK 2 0 SH (SUTURE) ×1 IMPLANT
SUT SILK 3 0 REEL (SUTURE) ×1 IMPLANT
SUT SILK 3 0 SH 30 (SUTURE) ×1 IMPLANT
SUT SILK 3-0 RB1 30XBRD (SUTURE) ×1
SUT VIC AB 3-0 SH 27X BRD (SUTURE) ×2 IMPLANT
SUT VIC AB 4-0 PS2 27 (SUTURE) ×2 IMPLANT
SUTURE SILK 3-0 RB1 30XBRD (SUTURE) ×1 IMPLANT
SYR 10ML LL (SYRINGE) ×1 IMPLANT
SYR BULB EAR ULCER 3OZ GRN STR (SYRINGE) ×1 IMPLANT
TIE VASCULAR MAXI BLUE 18IN ST (MISCELLANEOUS) ×1
TOWEL GREEN STERILE FF (TOWEL DISPOSABLE) ×2 IMPLANT
VASCULAR TIE MAXI BLUE 18IN ST (MISCELLANEOUS) ×1

## 2023-06-07 NOTE — Anesthesia Procedure Notes (Signed)
 Procedure Name: Intubation Date/Time: 06/07/2023 1:30 PM  Performed by: Buster Catheryn SAUNDERS, CRNAPre-anesthesia Checklist: Patient identified, Emergency Drugs available, Suction available and Patient being monitored Patient Re-evaluated:Patient Re-evaluated prior to induction Oxygen Delivery Method: Circle system utilized Preoxygenation: Pre-oxygenation with 100% oxygen Induction Type: IV induction Ventilation: Mask ventilation without difficulty Laryngoscope Size: Glidescope and 3 Tube type: Oral Tube size: 7.0 mm Number of attempts: 1 Airway Equipment and Method: Video-laryngoscopy and Stylet Placement Confirmation: ETT inserted through vocal cords under direct vision, positive ETCO2 and breath sounds checked- equal and bilateral Secured at: 22 cm Tube secured with: Tape Dental Injury: Teeth and Oropharynx as per pre-operative assessment  Difficulty Due To: Difficulty was anticipated, Difficult Airway- due to limited oral opening, Difficult Airway- due to reduced neck mobility and Difficult Airway- due to large tongue Future Recommendations: Recommend- induction with short-acting agent, and alternative techniques readily available

## 2023-06-07 NOTE — Transfer of Care (Signed)
 Immediate Anesthesia Transfer of Care Note  Patient: Tommy Ellison  Procedure(s) Performed: RIGHT IMPLANTATION OF HYPOGLOSSAL NERVE STIMULATOR (Right: Chest)  Patient Location: PACU  Anesthesia Type:General  Level of Consciousness: awake, alert , and oriented  Airway & Oxygen Therapy: Patient Spontanous Breathing and Patient connected to face mask oxygen  Post-op Assessment: Report given to RN and Post -op Vital signs reviewed and stable  Post vital signs: Reviewed and stable  Last Vitals:  Vitals Value Taken Time  BP 125/86 06/07/23 1523  Temp    Pulse 96 06/07/23 1524  Resp 18 06/07/23 1524  SpO2 100 % 06/07/23 1524  Vitals shown include unfiled device data.  Last Pain:  Vitals:   06/07/23 1210  TempSrc: Temporal      Patients Stated Pain Goal: (P) 5 (06/07/23 1210)  Complications:  Encounter Notable Events  Notable Event Outcome Phase Comment  Difficult to intubate - expected  Intraprocedure Filed from anesthesia note documentation.

## 2023-06-07 NOTE — Brief Op Note (Signed)
 06/07/2023  3:04 PM  PATIENT:  Tommy Ellison  48 y.o. male  PRE-OPERATIVE DIAGNOSIS:  Obstructive sleep apnea  POST-OPERATIVE DIAGNOSIS:  Obstructive sleep apnea  PROCEDURE:  Procedure(s): RIGHT IMPLANTATION OF HYPOGLOSSAL NERVE STIMULATOR (Right)  SURGEON:  Surgeons and Role:    DEWAINE Carlie Clark, MD - Primary  PHYSICIAN ASSISTANT: Benay  ASSISTANTS: none   ANESTHESIA:   general  EBL:  10 mL   BLOOD ADMINISTERED:none  DRAINS: none   LOCAL MEDICATIONS USED:  LIDOCAINE    SPECIMEN:  No Specimen  DISPOSITION OF SPECIMEN:  N/A  COUNTS:  YES  TOURNIQUET:  * No tourniquets in log *  DICTATION: .Note written in EPIC  PLAN OF CARE: Discharge to home after PACU  PATIENT DISPOSITION:  PACU - hemodynamically stable.   Delay start of Pharmacological VTE agent (>24hrs) due to surgical blood loss or risk of bleeding: no

## 2023-06-07 NOTE — Telephone Encounter (Signed)
   Faxed clinical notes, received fax confirmation.

## 2023-06-07 NOTE — Discharge Instructions (Addendum)
  Post Anesthesia Home Care Instructions  Activity: Get plenty of rest for the remainder of the day. A responsible individual must stay with you for 24 hours following the procedure.  For the next 24 hours, DO NOT: -Drive a car -Advertising copywriter -Drink alcoholic beverages -Take any medication unless instructed by your physician -Make any legal decisions or sign important papers.  Meals: Start with liquid foods such as gelatin or soup. Progress to regular foods as tolerated. Avoid greasy, spicy, heavy foods. If nausea and/or vomiting occur, drink only clear liquids until the nausea and/or vomiting subsides. Call your physician if vomiting continues.  Special Instructions/Symptoms: Your throat may feel dry or sore from the anesthesia or the breathing tube placed in your throat during surgery. If this causes discomfort, gargle with warm salt water. The discomfort should disappear within 24 hours.  If you had a scopolamine patch placed behind your ear for the management of post- operative nausea and/or vomiting:  1. The medication in the patch is effective for 72 hours, after which it should be removed.  Wrap patch in a tissue and discard in the trash. Wash hands thoroughly with soap and water. 2. You may remove the patch earlier than 72 hours if you experience unpleasant side effects which may include dry mouth, dizziness or visual disturbances. 3. Avoid touching the patch. Wash your hands with soap and water after contact with the patch.     Next dose of Tylenol  may be given at 7:30pm if needed. Next dose of NSAIDS (Ibuprofen/Motrin/Aleve) may be given at 8:45pm if needed.

## 2023-06-07 NOTE — Anesthesia Postprocedure Evaluation (Signed)
 Anesthesia Post Note  Patient: Tommy Ellison  Procedure(s) Performed: RIGHT IMPLANTATION OF HYPOGLOSSAL NERVE STIMULATOR (Right: Chest)     Patient location during evaluation: PACU Anesthesia Type: General Level of consciousness: awake and alert Pain management: pain level controlled Vital Signs Assessment: post-procedure vital signs reviewed and stable Respiratory status: spontaneous breathing, nonlabored ventilation, respiratory function stable and patient connected to nasal cannula oxygen Cardiovascular status: blood pressure returned to baseline and stable Postop Assessment: no apparent nausea or vomiting Anesthetic complications: yes  Encounter Notable Events  Notable Event Outcome Phase Comment  Difficult to intubate - expected  Intraprocedure Filed from anesthesia note documentation.    Last Vitals:  Vitals:   06/07/23 1545 06/07/23 1609  BP: 123/87 (!) 144/94  Pulse: (!) 101 98  Resp: 20 18  Temp:  36.4 C  SpO2: 95% 98%    Last Pain:  Vitals:   06/07/23 1609  TempSrc:   PainSc: 2                  Tommy Ellison

## 2023-06-07 NOTE — H&P (Signed)
 Tommy Ellison is an 48 y.o. male.   Chief Complaint: Obstructive sleep apnea HPI: 48 year old male with sleep apnea who has been unable to tolerate CPAP.  Past Medical History:  Diagnosis Date   Anxiety    Depression    Hearing loss    Hypersomnia    Hypertension    Rhinitis    Sleep apnea     Past Surgical History:  Procedure Laterality Date   COCHLEAR IMPLANT  03/06/2021   DRUG INDUCED ENDOSCOPY N/A 03/23/2023   Procedure: DRUG INDUCED SLEEP ENDOSCOPY;  Surgeon: Carlie Clark, MD;  Location: [facility];  Service: ENT;  Laterality: N/A;   TOE SURGERY     WISDOM TOOTH EXTRACTION      Family History  Problem Relation Age of Onset   Arthritis Other    Depression Mother    Social History:  reports that he has never smoked. He has never used smokeless tobacco. He reports current alcohol use. He reports that he does not use drugs.  Allergies: No Known Allergies  Medications Prior to Admission  Medication Sig Dispense Refill   amphetamine -dextroamphetamine  (ADDERALL XR) 20 MG 24 hr capsule Take 1 capsule (20 mg total) by mouth daily. 30 capsule 0   citalopram  (CELEXA ) 20 MG tablet Take 1 tablet (20 mg total) by mouth daily. 90 tablet 1   etodolac  (LODINE ) 400 MG tablet TAKE 1 TABLET (400 MG TOTAL) BY MOUTH 2 (TWO) TIMES DAILY. SCHEDULE FOLLOW UP FOR FUTURE REFILLS 60 tablet 3   FOLIC ACID  PO Take 800 mcg by mouth daily.     gabapentin  (NEURONTIN ) 600 MG tablet TAKE 1 TABLET BY MOUTH THREE TIMES A DAY 90 tablet 0   lamoTRIgine  (LAMICTAL ) 150 MG tablet Take 1 tablet (150 mg total) by mouth daily. 60 tablet 11   metoprolol  succinate (TOPROL -XL) 25 MG 24 hr tablet Take 1 tablet (25 mg total) by mouth daily. 90 tablet 1   Multiple Vitamin (MULTIVITAMIN) tablet Take 1 tablet by mouth daily.     nortriptyline  (PAMELOR ) 25 MG capsule Take 2 capsules (50 mg total) by mouth at bedtime. 180 capsule 4   pantoprazole  (PROTONIX ) 40 MG tablet Take 40 mg by mouth daily.      acetaminophen  (TYLENOL ) 500 MG tablet Take 500 mg by mouth every 6 (six) hours as needed.     Cholecalciferol (VITAMIN D3 PO) Take 5,000 Units by mouth daily.     Cyanocobalamin  (VITAMIN B-12 PO) Take 1,000 mcg by mouth daily.     famotidine  (PEPCID ) 40 MG tablet Take 1 tablet (40 mg total) by mouth daily. (Patient not taking: Reported on 04/27/2023) 30 tablet 3   Ferrous Sulfate (IRON PO) Take 65 mg by mouth 2 (two) times daily.     ferrous sulfate 325 (65 FE) MG tablet Take 325 mg by mouth 2 (two) times daily with a meal.     ondansetron  (ZOFRAN ) 8 MG tablet Take 1 tablet (8 mg total) by mouth every 8 (eight) hours as needed for nausea or vomiting. 20 tablet 3   tadalafil  (CIALIS ) 20 MG tablet Take 1 tablet (20 mg total) by mouth daily as needed for erectile dysfunction. 12 tablet 5    No results found for this or any previous visit (from the past 48 hours). No results found.  Review of Systems  All other systems reviewed and are negative.   Blood pressure (!) (P) 156/104, pulse (P) 100, temperature (!) 97.3 F (36.3 C), temperature source  Temporal, resp. rate 16, height (P) 5' 9 (1.753 m), weight (P) 85.5 kg, SpO2 (P) 100%. Physical Exam Constitutional:      Appearance: Normal appearance. He is normal weight.  HENT:     Head: Normocephalic and atraumatic.     Right Ear: External ear normal.     Left Ear: External ear normal.     Nose: Nose normal.     Mouth/Throat:     Mouth: Mucous membranes are moist.     Pharynx: Oropharynx is clear.  Eyes:     Extraocular Movements: Extraocular movements intact.     Conjunctiva/sclera: Conjunctivae normal.     Pupils: Pupils are equal, round, and reactive to light.  Cardiovascular:     Rate and Rhythm: Normal rate.  Pulmonary:     Effort: Pulmonary effort is normal.  Musculoskeletal:     Cervical back: Normal range of motion.  Skin:    General: Skin is warm and dry.  Neurological:     General: No focal deficit present.     Mental  Status: He is alert and oriented to person, place, and time.  Psychiatric:        Mood and Affect: Mood normal.        Behavior: Behavior normal.        Thought Content: Thought content normal.        Judgment: Judgment normal.      Assessment/Plan Obstructive sleep apnea and BMI 27.84  To OR for hypoglossal nerve stimulator placement.  Vaughan Ricker, MD 06/07/2023, 1:11 PM

## 2023-06-07 NOTE — Op Note (Signed)

## 2023-06-07 NOTE — Anesthesia Preprocedure Evaluation (Addendum)
 Anesthesia Evaluation  Patient identified by MRN, date of birth, ID band Patient awake    Reviewed: Allergy & Precautions, NPO status , Patient's Chart, lab work & pertinent test results  Airway Mallampati: III  TM Distance: >3 FB Neck ROM: Full    Dental  (+) Teeth Intact, Dental Advisory Given   Pulmonary sleep apnea    breath sounds clear to auscultation       Cardiovascular hypertension,  Rhythm:Regular Rate:Normal     Neuro/Psych  PSYCHIATRIC DISORDERS Anxiety Depression       GI/Hepatic Neg liver ROS,GERD  Medicated,,  Endo/Other  negative endocrine ROS    Renal/GU negative Renal ROS     Musculoskeletal negative musculoskeletal ROS (+)    Abdominal   Peds  Hematology negative hematology ROS (+)   Anesthesia Other Findings - Hearing Impaired  Reproductive/Obstetrics                             Anesthesia Physical Anesthesia Plan  ASA: 2  Anesthesia Plan: General   Post-op Pain Management: Tylenol  PO (pre-op)* and Toradol  IV (intra-op)*   Induction: Intravenous  PONV Risk Score and Plan: 3 and Ondansetron , Dexamethasone  and Midazolam   Airway Management Planned: LMA and Oral ETT  Additional Equipment: None  Intra-op Plan:   Post-operative Plan: Extubation in OR  Informed Consent: I have reviewed the patients History and Physical, chart, labs and discussed the procedure including the risks, benefits and alternatives for the proposed anesthesia with the patient or authorized representative who has indicated his/her understanding and acceptance.     Dental advisory given  Plan Discussed with: CRNA  Anesthesia Plan Comments:        Anesthesia Quick Evaluation

## 2023-06-08 ENCOUNTER — Other Ambulatory Visit: Payer: Self-pay | Admitting: *Deleted

## 2023-06-08 ENCOUNTER — Encounter: Payer: Self-pay | Admitting: Neurology

## 2023-06-08 ENCOUNTER — Encounter (HOSPITAL_BASED_OUTPATIENT_CLINIC_OR_DEPARTMENT_OTHER): Payer: Self-pay | Admitting: Otolaryngology

## 2023-06-08 MED ORDER — AMPHETAMINE-DEXTROAMPHET ER 20 MG PO CP24: 20.0000 mg | ORAL_CAPSULE | Freq: Every day | ORAL | 0 refills | Status: DC

## 2023-06-08 NOTE — Telephone Encounter (Signed)
 Last seen 04/27/23 and next f/u 08/30/23. Last refilled 05/08/23 #30.

## 2023-06-10 NOTE — Telephone Encounter (Signed)
[facility]) requesting IVIG products patient has been on in the past. Trying to figure out what product to get a PA for. Contact info: [phone]

## 2023-06-14 NOTE — Telephone Encounter (Signed)
Faxed signed order back to [facility] at [phone]. Received fax confirmation.

## 2023-06-14 NOTE — Telephone Encounter (Signed)
Called Agnes back. She confirmed pt approved for Vyvgart. Copay 4.80 via Medicare. Dx not covered under Medicare so they had to go via pharmacy. Nursing/supplies covered, he will not be billed for this. They spoke w/ his girlfriend Duwayne Heck also. Informed her Medicaid showing inactive. Tentative first infusion date 06/17/23. They need Dr. Epimenio Foot to sign order they faxed before shipping med out. Aware I will have him sign today and fax back.   I called [facility]. Cx orders for IVIG. Spoke w/ Gerilyn Pilgrim there.

## 2023-06-14 NOTE — Telephone Encounter (Signed)
Agnus with [facility]    Has returned call to RN

## 2023-06-14 NOTE — Telephone Encounter (Signed)
Agnus with [facility] returning call. Requesting call back to [phone]. [phone]

## 2023-06-14 NOTE — Telephone Encounter (Signed)
LVM for Agnus to call office back to discuss.

## 2023-06-14 NOTE — Telephone Encounter (Signed)
Per Ashley/phone room over lunch timeframe: "Agnus with [facility] returning call. Requesting call back to [phone] ext. [phone] "

## 2023-06-14 NOTE — Telephone Encounter (Signed)
Called [facility] [place] at [phone], confirmed PA approved for pt to receive Vyvgart through them in home. Unsure of cost. Transferred me to intake team. LVM for Nicole Kindred to call office back today before 5pm.  Per their records, pt received last IVIG 06/01/23, 06/02/23.

## 2023-06-15 NOTE — Telephone Encounter (Signed)
Morrie Sheldon from [facility] has called to provide pt's home infusion start date of 1-23 if there are questions their call back # is [phone]

## 2023-06-28 ENCOUNTER — Other Ambulatory Visit: Payer: Self-pay | Admitting: Neurology

## 2023-06-29 ENCOUNTER — Telehealth: Payer: Self-pay | Admitting: *Deleted

## 2023-06-29 NOTE — Telephone Encounter (Signed)
Faxed signed orders back to [facility] [place] at [phone]. Received fax confirmation.

## 2023-06-30 ENCOUNTER — Other Ambulatory Visit: Payer: Self-pay | Admitting: Neurology

## 2023-07-01 ENCOUNTER — Other Ambulatory Visit: Payer: Self-pay

## 2023-07-02 DIAGNOSIS — H903 Sensorineural hearing loss, bilateral: Secondary | ICD-10-CM | POA: Insufficient documentation

## 2023-07-04 ENCOUNTER — Other Ambulatory Visit: Payer: Self-pay | Admitting: Neurology

## 2023-07-06 ENCOUNTER — Telehealth: Payer: Self-pay | Admitting: *Deleted

## 2023-07-06 NOTE — Telephone Encounter (Signed)
I called [facility] and spoke w/ Shanda Bumps. She sees notes that pt received Vyvgart 06/17/23 and 06/24/23. Does not see anything after that. She transferred me to Boneta Lucks who was incorrect person. Should go to contact for [place], [place]. She placed me on hold and transferred me to [place]. She transferred me to pharmacist, [place]. She was [place] location. She transferred me to [place], [place]. Spoke w/ [id number]. Confirmed he received treatment 06/17/23 and notes showing he was scheduled for 06/24/23 but no confirmation he got treatment this day. She placed me on hold to reach out to [facility] who helps him to try and confirm. They have been having trouble reaching him.   Duwayne Heck confirmed he received treatment on 06/24/23, 07/01/23 and next scheduled treatment 07/08/23. They will fax treatment updates to Dr. Epimenio Foot in the future to fax [phone].

## 2023-07-07 ENCOUNTER — Other Ambulatory Visit: Payer: Self-pay | Admitting: Neurology

## 2023-07-07 MED ORDER — METHYLPHENIDATE HCL ER (LA) 20 MG PO CP24: 20.0000 mg | ORAL_CAPSULE | ORAL | 0 refills | Status: DC

## 2023-07-12 ENCOUNTER — Other Ambulatory Visit (HOSPITAL_COMMUNITY): Payer: Self-pay

## 2023-07-12 ENCOUNTER — Other Ambulatory Visit: Payer: Self-pay | Admitting: *Deleted

## 2023-07-12 MED ORDER — METHYLPHENIDATE HCL ER (LA) 20 MG PO CP24
20.0000 mg | ORAL_CAPSULE | ORAL | 0 refills | Status: DC
  Filled 2023-07-12 – 2023-07-13 (×4): qty 30, 30d supply, fill #0

## 2023-07-12 NOTE — Telephone Encounter (Signed)
[facility] out of stock. Pt requesting rx go to [facility].

## 2023-07-13 ENCOUNTER — Other Ambulatory Visit (HOSPITAL_COMMUNITY): Payer: Self-pay

## 2023-07-14 ENCOUNTER — Other Ambulatory Visit: Payer: Self-pay | Admitting: Neurology

## 2023-07-14 ENCOUNTER — Telehealth: Payer: Self-pay

## 2023-07-14 ENCOUNTER — Telehealth: Payer: Self-pay | Admitting: *Deleted

## 2023-07-14 ENCOUNTER — Encounter: Payer: Self-pay | Admitting: Neurology

## 2023-07-14 ENCOUNTER — Other Ambulatory Visit (HOSPITAL_COMMUNITY): Payer: Self-pay

## 2023-07-14 MED ORDER — METHYLPHENIDATE HCL 10 MG PO TABS: 10.0000 mg | ORAL_TABLET | Freq: Two times a day (BID) | ORAL | 0 refills | Status: DC

## 2023-07-14 MED ORDER — LAMOTRIGINE 150 MG PO TABS: 150.0000 mg | ORAL_TABLET | Freq: Two times a day (BID) | ORAL | 11 refills | Status: AC

## 2023-07-14 NOTE — Telephone Encounter (Signed)
PA Methylphenidate needed

## 2023-07-14 NOTE — Telephone Encounter (Signed)
   Pharmacy Patient Advocate Encounter   Received notification from RX Request Messages that prior authorization for Methylphenidate HCl ER (LA) 20MG  er capsules is required/requested.   Insurance verification completed.   The patient is insured through [place] .   Per test claim:  see above is preferred by the insurance.  If suggested medication is appropriate, Please send in a new RX and discontinue this one. If not, please advise as to why it's not appropriate so that we may request a Prior Authorization. Please note, some preferred medications may still require a PA.  If the suggested medications have not been trialed and there are no contraindications to their use, the PA will not be submitted, as it will not be approved.

## 2023-07-14 NOTE — Telephone Encounter (Signed)
Dr. Epimenio Foot stated:  Lets see if the immediate release methylphenidate 10 mg is covered     one po twice a day  #60  Dr. Epimenio Foot,  I think you will have to send this because controlled. Thanks,  Production assistant, radio

## 2023-07-14 NOTE — Telephone Encounter (Signed)
Pharmacy Patient Advocate Encounter   Received notification from Physician's Office that prior authorization for Methylpenidate 10MG  Tablet is required/requested.   Insurance verification completed.   The patient is insured through [place] .   Per test claim: The current 30 day co-pay is, $0.  No PA needed at this time. This test claim was processed through [facility]- copay amounts may vary at other pharmacies due to pharmacy/plan contracts, or as the patient moves through the different stages of their insurance plan.

## 2023-07-20 ENCOUNTER — Encounter: Payer: Self-pay | Admitting: Neurology

## 2023-07-20 ENCOUNTER — Telehealth: Payer: Self-pay | Admitting: Neurology

## 2023-07-20 ENCOUNTER — Ambulatory Visit (INDEPENDENT_AMBULATORY_CARE_PROVIDER_SITE_OTHER): Payer: Medicare Other | Admitting: Neurology

## 2023-07-20 VITALS — Ht 69.0 in | Wt 196.0 lb

## 2023-07-20 DIAGNOSIS — R1310 Dysphagia, unspecified: Secondary | ICD-10-CM | POA: Diagnosis not present

## 2023-07-20 DIAGNOSIS — R296 Repeated falls: Secondary | ICD-10-CM

## 2023-07-20 DIAGNOSIS — Z9682 Presence of neurostimulator: Secondary | ICD-10-CM

## 2023-07-20 DIAGNOSIS — R400 Somnolence: Secondary | ICD-10-CM

## 2023-07-20 DIAGNOSIS — G6181 Chronic inflammatory demyelinating polyneuritis: Secondary | ICD-10-CM | POA: Diagnosis not present

## 2023-07-20 NOTE — Telephone Encounter (Signed)
 Patient was seen today for inspire.

## 2023-07-20 NOTE — Addendum Note (Signed)
 Addended by: Melvyn Novas on: 07/20/2023 05:03 PM   Modules accepted: Orders

## 2023-07-20 NOTE — Progress Notes (Signed)
 [facility]EP [facility]    Provider:  Melvyn Novas, MD  Primary Care Physician:  Jackelyn Poling, DO [address] [place] [address]     Referring Provider: Jackelyn Poling, Do [address] [place],  [place] [address]          Chief Complaint according to patient   Patient presents with:     New Patient (Initial Visit)           HISTORY OF PRESENT ILLNESS:  Tommy Ellison is a 48 y.o. male patient who is seen upon referral from Dr. Epimenio Foot on 07/20/2023 for Inspire activation.  Chief concern according to patient :  " I am sleepy and fatigued" I sleep  normally 10-12 hours from 11 PM until 4-5 AM and watch TV 1-2 hours and sleep again until 11 AM.  I can't work anymore because of my legs and have time to sleep and nap. I was found to have sleep apnea but have  been unable to tolerate CPAP.    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 The patient had fallen asleep in the infusion center and RN had witnessed apnea right there.  Sleep relevant medical history: Nocturia/ Enuresis , obesity, CIDP, gait impairment,  hearing loss.  Here for polyneuropathy. Pt said feet has gotten worsen in last couple months, legs feel better. Both hands feel better. Does not use cpap machine.       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       Updat 04/27/2023 He feels strength and sensation are doing better.  He still has some weakness in his feet.   He can walk 1/2 a mile without a rest.   Sensation is better but he has a lot of foot pain still.     Due to transportation issues (he does not drive), he has found it difficult to do the IVIG.  Additionally, blood pressure sometimes increases during the infusions.   His IVIG dose is  130 g (65 g x 2 days) every 4 weeks.  He generally feels better for 3 weeks after each IVIG but then the 4th  week he is more numb, more off balanced and has much more pain.  He also has dysesthetic pain in the feet with both a cold sensation and pins and needle sensation.   Before the IVIG we had tried prednisone without benefit.   He takes gabapentin 3 times a day with some benefit  Tramadol just helped a little bit.      OSA/narcolepsy:   HST 08/13/2021 showed moderate OSA with AHI 24.8/hr, REM AHI 33.7/hr and O2 nadir 85%. AutoPAP was tried but he had poor compliance.     APAP upset his stomach and he swallowed a lot of air.   .  When he did not have nausea, he felt better using CPAP.   He has dreams of animals attacking him.   He takes naps x 2 for 1 hour each.   He has had a few spells of sleep paralysis but has not had cataplexy (no spells associated with emotion).      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   Social history:  Patient  is medically retired from work.   Tommy Ellison is a 48 year old man with deafness and polyneuropathy since 2020.     He feels the numbness, pain and balance are doing worse.   Pain in the lower back and feet is the worst.   The back pain has a more aching quality while the foot pain is dysesthetic with a painful cold sensation as well as pins-and-needles.He had generally been in good health until 2020.  He became deaf over a few days in July 2020. However hearing had declined some in preceding month (mildly muffled).   He also became more ubbalanced that month and fell 12/03/2020.     He has had ED since 2020 as well.  Dysesthesias and numbness began around September or October 2020.  Balance worsened around that time as well and he had a fall at that time.  He feels symptoms have progressively worsened since the onset.   He notes mild weakness in his legs but not in his hands.      He notes no bladder changes.  He has a history of moderate alcohol abuse but drinks much less over the past  couple of years.       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     CSF did not show elevated protein or any other abnormality.   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       Labs 11/02/2019.  SPEP/IEF showed a polyclonal increase in IgM.  SSA/SSB, cryoglobulins and thiamine were normal.   Anti-mag IgM was negative.  Hepatitis labs were negative.      I reviewed lab work from primary care.  B12 and TSH were normal.   He has no family history of polyneuropathy that he is aware of. He has only 10-20% sensation at the ankles and < 10% vibration sensation at the toes.  There is reduced pinprick sensation below the knee worse in the feet  Review of Systems: Out of a complete 14 system review, the patient complains of only the following symptoms, and all other reviewed systems are negative.:  Fatigue, sleepiness , snoring, fragmented sleep, Insomnia, RLS, Nocturia    How likely are you to doze in the following situations: 0 = not likely, 1 = slight chance, 2 = moderate chance, 3 = high chance   Sitting and Reading? Watching Television? Sitting inactive in a public place (theater or meeting)? As a passenger in a car for an hour without a break? Lying down in the afternoon when circumstances permit? Sitting and talking to someone? Sitting quietly after lunch without alcohol? In a car, while stopped for a few minutes in traffic?   Total = 22/ 24 points   FSS endorsed at 61/ 63 points.    Deafness  Leg numbness and weakness.   Social History   Socioeconomic History   Marital status: Single    Spouse name: Not on file   Number of children: Not on file   Years of education: Not on file   Highest education level: Not on file   Occupational History   Not on file  Tobacco Use   Smoking status: Never   Smokeless tobacco: Never  Vaping Use   Vaping status: Never Used  Substance and Sexual Activity   Alcohol use:  Yes    Comment: daily    Drug use: No   Sexual activity: Not on file  Other Topics Concern   Not on file  Social History Narrative   Right handed   2 cokes per day   Social Drivers of Health   Financial Resource Strain: Not on file  Food Insecurity: Low Risk  (02/08/2023)   Received from [facility]   Hunger Vital Sign    Worried About Running Out of Food in the Last Year: Never true    Ran Out of Food in the Last Year: Never true  Transportation Needs: No Transportation Needs (02/08/2023)   Received from [facility]    In the past 12 months, has lack of reliable transportation kept you from medical appointments, meetings, work or from getting things needed for daily living? : No  Physical Activity: Not on file  Stress: Not on file  Social Connections: Not on file    Family History  Problem Relation Age of Onset   Arthritis Other    Depression Mother     Past Medical History:  Diagnosis Date   Anxiety    Depression    Hearing loss    Hypersomnia    Hypertension    Rhinitis    Sleep apnea     Past Surgical History:  Procedure Laterality Date   COCHLEAR IMPLANT  03/06/2021   DRUG INDUCED ENDOSCOPY N/A 03/23/2023   Procedure: DRUG INDUCED SLEEP ENDOSCOPY;  Surgeon: Christia Reading, MD;  Location: [facility];  Service: ENT;  Laterality: N/A;   IMPLANTATION OF HYPOGLOSSAL NERVE STIMULATOR Right 06/07/2023   Procedure: RIGHT IMPLANTATION OF HYPOGLOSSAL NERVE STIMULATOR;  Surgeon: Christia Reading, MD;  Location: [facility];  Service: ENT;  Laterality: Right;   TOE SURGERY     WISDOM TOOTH EXTRACTION       Current Outpatient Medications on File Prior to Visit  Medication Sig Dispense Refill   acetaminophen (TYLENOL) 500 MG tablet  Take 500 mg by mouth every 6 (six) hours as needed.     Cholecalciferol (VITAMIN D3 PO) Take 5,000 Units by mouth daily.     citalopram (CELEXA) 20 MG tablet Take 1 tablet (20 mg total) by mouth daily. 90 tablet 1   Cyanocobalamin (VITAMIN B-12 PO) Take 1,000 mcg by mouth daily.     etodolac (LODINE) 400 MG tablet TAKE 1 TABLET (400 MG TOTAL) BY MOUTH 2 (TWO) TIMES DAILY. SCHEDULE FOLLOW UP FOR FUTURE REFILLS 60 tablet 3   famotidine (PEPCID) 40 MG tablet Take 1 tablet (40 mg total) by mouth daily. 30 tablet 3   Ferrous Sulfate (IRON PO) Take 65 mg by mouth 2 (two) times daily.     ferrous sulfate 325 (65 FE) MG tablet Take 325 mg by mouth 2 (two) times daily with a meal.     FOLIC ACID PO Take 800 mcg by mouth daily.     gabapentin (NEURONTIN) 600 MG tablet TAKE 1 TABLET BY MOUTH THREE TIMES A DAY 90 tablet 0   HYDROcodone-acetaminophen (NORCO/VICODIN) 5-325 MG tablet Take 1-2 tablets by mouth every 6 (six) hours as needed for moderate pain (pain score 4-6). 12 tablet 0   lamoTRIgine (LAMICTAL) 150 MG tablet Take 1 tablet (150 mg total) by mouth 2 (two) times daily. 60 tablet 11   methylphenidate (RITALIN) 10 MG tablet Take 1 tablet (10 mg total) by mouth 2 (two) times daily. 60 tablet 0   metoprolol succinate (TOPROL-XL)  25 MG 24 hr tablet Take 1 tablet (25 mg total) by mouth daily. 90 tablet 1   Multiple Vitamin (MULTIVITAMIN) tablet Take 1 tablet by mouth daily.     nortriptyline (PAMELOR) 25 MG capsule Take 2 capsules (50 mg total) by mouth at bedtime. 180 capsule 4   ondansetron (ZOFRAN) 8 MG tablet Take 1 tablet (8 mg total) by mouth every 8 (eight) hours as needed for nausea or vomiting. 20 tablet 3   pantoprazole (PROTONIX) 40 MG tablet Take 40 mg by mouth daily.     tadalafil (CIALIS) 20 MG tablet Take 1 tablet (20 mg total) by mouth daily as needed for erectile dysfunction. 12 tablet 5   No current facility-administered medications on file prior to visit.    No Known Allergies    DIAGNOSTIC DATA (LABS, IMAGING, TESTING) - I reviewed patient records, labs, notes, testing and imaging myself where available.  Lab Results  Component Value Date   WBC 6.8 11/10/2022   HGB 13.4 11/10/2022   HCT 38.0 11/10/2022   MCV 108 (H) 11/10/2022   PLT 295 11/10/2022      Component Value Date/Time   NA 125 (L) 01/18/2023 1156   K 4.4 01/18/2023 1156   CL 90 (L) 01/18/2023 1156   CO2 20 01/18/2023 1156   GLUCOSE 86 01/18/2023 1156   GLUCOSE 105 (H) 04/30/2011 1109   BUN 4 (L) 01/18/2023 1156   CREATININE 0.59 (L) 01/18/2023 1156   CALCIUM 8.8 01/18/2023 1156   PROT 9.9 (H) 08/13/2021 1438   ALBUMIN 4.4 01/18/2023 1156   AST CANCELED 08/13/2021 1438   ALT 22 08/13/2021 1438   ALKPHOS 100 08/13/2021 1438   BILITOT 1.1 08/13/2021 1438   GFRNONAA >90 04/30/2011 1109   GFRAA >90 04/30/2011 1109   No results found for: "CHOL", "HDL", "LDLCALC", "LDLDIRECT", "TRIG", "CHOLHDL" No results found for: "HGBA1C" No results found for: "VITAMINB12" No results found for: "TSH"  PHYSICAL EXAM:  Today's Vitals   07/20/23 1601  Weight: 196 lb (88.9 kg)  Height: 5\' 9"  (1.753 m)   Body mass index is 28.94 kg/m.   Wt Readings from Last 3 Encounters:  07/20/23 196 lb (88.9 kg)  06/07/23 (P) 188 lb 7.9 oz (85.5 kg)  04/27/23 190 lb (86.2 kg)     Ht Readings from Last 3 Encounters:  07/20/23 5\' 9"  (1.753 m)  06/07/23 (P) 5\' 9"  (1.753 m)  04/27/23 5\' 9"  (1.753 m)      General: The patient is awake, alert and appears not in acute distress. The patient  has facial hair , reads lips.  Head: Normocephalic, atraumatic. Neck is supple.  Mallampati 3,  neck circumference:18" inches .  Nasal airflow patent.  Retrognathia is not seen.  Dental status:  biological  Cardiovascular:  Regular rate and cardiac rhythm by pulse,  without distended neck veins. Respiratory: Lungs are clear to auscultation.  Skin:  Without evidence of ankle edema, or rash. Trunk: The patient's posture is  erect.   NEUROLOGIC EXAM: The patient is awake and alert, oriented to place and time.   Speech is fluent,  with dysphonia . Mood and affect are appropriate.   Cranial nerves: no loss of smell or taste reported  Pupils are equal and briskly reactive to light.  Extraocular movements in vertical and horizontal planes were intact and without nystagmus. No Diplopia. Visual fields by finger perimetry are intact. Hearing impaired   Facial motor strength is symmetric and tongue and uvula move midline.  Neck ROM : rotation, tilt and flexion extension were normal for age and shoulder shrug was symmetrical.    Normal grip strength and dexterity.   wide based gait.   ASSESSMENT AND PLAN 48 y.o. year old male  here with:    1) recently implanted Inspire hypoglossal nerve stimulator  for OSA dx by Dr Epimenio Foot. The patient had great difficulties with CPAP compliance and had discontinue treatment. He remained excessively fatigued and daytime sleepy  on and off CPAP.    He was activated today, the settings will be from 1.4 v to 2.4 V . He documented good tongue motion on 1.4 V here in the lab  Attached are the wave forms.  The patient did get exp tensive one on one teaching today. He was instructed to bring his remote control to all appointments in the future.    2)  May 22 nd appointment for RV   3) return in June 2025 for in lab titration of inspire device.   No orders of the defined types were placed in this encounter.    I plan to follow up either personally or through our NP within 3 months.   I would like to thank Jackelyn Poling, DO and Jackelyn Poling, Do [address] [place],  [place] [address] for allowing me to meet with and to take care of this pleasant patient.   CC: I will share my notes with Dr Epimenio Foot .  After spending a total time of  45  minutes face to face and additional time for physical and neurologic examination, review of laboratory studies,  personal review of imaging  studies, reports and results of other testing and review of referral information / records as far as provided in visit,   Electronically signed by: Melvyn Novas, MD 07/20/2023 4:20 PM  [facility] Board certified by The [employer] of Sleep Medicine and Diplomate of the [employer] of Sleep Medicine. Board certified In Neurology through the ABPN, Fellow of the [employer] of Neu[employer].

## 2023-07-21 ENCOUNTER — Telehealth: Payer: Self-pay | Admitting: Neurology

## 2023-07-21 ENCOUNTER — Ambulatory Visit: Payer: Medicare Other | Admitting: Neurology

## 2023-07-21 NOTE — Telephone Encounter (Signed)
 [place] Medicare/Medicaid no auth req   Patient is schedule at [facility] for 10/25/23 at 8 pm.  Mailed packet to the patient.

## 2023-07-21 NOTE — Telephone Encounter (Signed)
 Marland Kitchen

## 2023-08-03 ENCOUNTER — Other Ambulatory Visit: Payer: Self-pay | Admitting: Neurology

## 2023-08-05 ENCOUNTER — Encounter: Payer: Self-pay | Admitting: Neurology

## 2023-08-09 ENCOUNTER — Other Ambulatory Visit: Payer: Self-pay | Admitting: *Deleted

## 2023-08-09 DIAGNOSIS — R296 Repeated falls: Secondary | ICD-10-CM

## 2023-08-09 DIAGNOSIS — R269 Unspecified abnormalities of gait and mobility: Secondary | ICD-10-CM

## 2023-08-16 ENCOUNTER — Telehealth: Payer: Self-pay | Admitting: *Deleted

## 2023-08-16 MED ORDER — METHYLPHENIDATE HCL 10 MG PO TABS: 10.0000 mg | ORAL_TABLET | Freq: Two times a day (BID) | ORAL | 0 refills | Status: DC

## 2023-08-16 NOTE — Telephone Encounter (Signed)
 Faxed signed order below to [facility], received fax confirmation.

## 2023-08-16 NOTE — Addendum Note (Signed)
 Addended by: Aura Camps on: 08/16/2023 01:14 PM   Modules accepted: Orders

## 2023-08-16 NOTE — Telephone Encounter (Signed)
 Last seen 05/17/23 Follow up scheduled on 08/30/23 Last filled on 07/16/23 #30 tablets  Rx pending to be signed

## 2023-08-17 NOTE — Telephone Encounter (Addendum)
 Faxed signed CMN order to [facility] at [phone]. Received fax confirmation.

## 2023-08-18 NOTE — Telephone Encounter (Signed)
 Dr. Epimenio Foot- they are requesting notes to include medical necessity for bilateral AFO's. Can you update last note to include this and then I will fax? Thank you

## 2023-08-18 NOTE — Telephone Encounter (Signed)
 Faxed order/updated note to [facility] at [phone]. Received fax confirmation.

## 2023-08-30 ENCOUNTER — Ambulatory Visit (INDEPENDENT_AMBULATORY_CARE_PROVIDER_SITE_OTHER): Payer: Medicare Other | Admitting: Neurology

## 2023-08-30 ENCOUNTER — Encounter: Payer: Self-pay | Admitting: Neurology

## 2023-08-30 VITALS — BP 136/96 | HR 110 | Ht 69.0 in | Wt 185.0 lb

## 2023-08-30 DIAGNOSIS — R269 Unspecified abnormalities of gait and mobility: Secondary | ICD-10-CM

## 2023-08-30 DIAGNOSIS — G6181 Chronic inflammatory demyelinating polyneuritis: Secondary | ICD-10-CM

## 2023-08-30 DIAGNOSIS — R200 Anesthesia of skin: Secondary | ICD-10-CM

## 2023-08-30 DIAGNOSIS — M21379 Foot drop, unspecified foot: Secondary | ICD-10-CM | POA: Diagnosis not present

## 2023-08-30 DIAGNOSIS — Z9682 Presence of neurostimulator: Secondary | ICD-10-CM

## 2023-08-30 MED ORDER — METOPROLOL SUCCINATE ER 50 MG PO TB24: 50.0000 mg | ORAL_TABLET | Freq: Every day | ORAL | 3 refills | Status: AC

## 2023-08-30 NOTE — Progress Notes (Signed)
 [facility]  PATIENT: Tommy Ellison DOB: 08-05-1975  REFERRING DOCTOR OR PCP: Henrine Screws, MD SOURCE: Patient, notes from primary care  _________________________________   HISTORICAL  CHIEF COMPLAINT:  Chief Complaint  Patient presents with   Follow-up    Pt in 10 alone Pt here for CIDP f/u pt states elevated BP , Pt states night pain ,Pt states wants to discuss infusions, Pt wants to discuss medication     HISTORY OF PRESENT ILLNESS:  Tommy Ellison is a 48 year old man with deafness and polyneuropathy since 2020.      Update 08/30/2023: He switched from IVIg to Vyvgart and is tolerating it well.   He feels he is doing at least as well as IVIg.  Home health comes to his house to do.    He has had 5-6 of the injections so far.     Before the IVIG we had tried prednisone without benefit.  Gait is stable.  He is walking without his cane now.  He tries to walk outdoors daily.  No recent fall.   He will be getting AFOs bilaterally.   He feels strength and sensation are doing slightly better on Vyvgart.  He can walkup to half mile.  He does not keep up with others due to the foot drop.   Sensation is better but he has a lot of foot pain still.  He also has dysesthetic pain in the feet with both a cold sensation and pins and needle sensation.   He takes gabapentin 3 times a day with some benefit  Tramadol just helped a little bit.     OSA/narcolepsy:   HST 08/13/2021 showed moderate OSA with AHI 24.8/hr, REM AHI 33.7/hr and O2 nadir 85%. AutoPAP was tried but he had poor compliance.     APAP upset his stomach and he swallowed a lot of air.  As he could not tolerate, he was evaluated for [facility] and did the surgery late 2024 (Dr. Vickey Huger will follow)   He has dreams of animals attacking him.   He takes naps x 2 for 1 hour each.   He has had a few spells of sleep paralysis but has not had cataplexy (no spells associated with emotion).     Hypnagogic  hallucinations  are better on nortriptyline and lamotrigine.   Deafness: He now has a cochlear implant and is able to hear a little bit ;   History of neuropathy:  He had generally been in good health until 2020.  He became deaf over a few days in July 2020. However hearing had declined some in preceding month (mildly muffled).   He also became more ubbalanced that month and fell 12/03/2020.     He has had ED since 2020 as well.  Dysesthesias and numbness began around September or October 2020.  Balance worsened around that time as well and he had a fall at that time.  He feels symptoms have progressively worsened since the onset.   He notes mild weakness in his legs but not in his hands.      He notes no bladder changes.  He has a history of moderate alcohol abuse but drinks much less over the past couple of years.      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    CSF did not show elevated protein or any  other abnormality.   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      Labs 11/02/2019.  SPEP/IEF showed a polyclonal increase in IgM.  SSA/SSB, cryoglobulins and thiamine were normal.   Anti-mag IgM was negative.  Hepatitis labs were negative.      I reviewed lab work from primary care.  B12 and TSH were normal.  He has no family history of polyneuropathy that he is aware of.  REVIEW OF SYSTEMS: Constitutional: No fevers, chills, sweats, or change in appetite Eyes: No visual changes, double vision, eye pain it looks like the medicine is only $9 for 30 pills with good Rx it looks like the medicine is only $9 at [facility] with good Rx it if there is a Medicare appeal or preauthorization we can do that. Ear, nose and throat: No hearing loss, ear pain, nasal congestion,  sore throat Cardiovascular: No chest pain, palpitations Respiratory:  No shortness of breath at rest or with exertion.   No wheezes GastrointestinaI: No nausea, vomiting, diarrhea, abdominal pain, fecal incontinence Genitourinary:  No dysuria, urinary retention or frequency.  No nocturia. Musculoskeletal:  No neck pain, back pain Integumentary: No rash, pruritus, skin lesions Neurological: as above Psychiatric: No depression at this time.  No anxiety Endocrine: No palpitations, diaphoresis, change in appetite, change in weigh or increased thirst Hematologic/Lymphatic:  No anemia, purpura, petechiae. Allergic/Immunologic: No itchy/runny eyes, nasal congestion, recent allergic reactions, rashes  ALLERGIES: No Known Allergies  HOME MEDICATIONS:  Current Outpatient Medications:    acetaminophen (TYLENOL) 500 MG tablet, Take 500 mg by mouth every 6 (six) hours as needed., Disp: , Rfl:    Cholecalciferol (VITAMIN D3 PO), Take 5,000 Units by mouth daily., Disp: , Rfl:    citalopram (CELEXA) 20 MG tablet, Take 1 tablet (20 mg total) by mouth daily., Disp: 90 tablet, Rfl: 1   Cyanocobalamin (VITAMIN B-12 PO), Take 1,000 mcg by mouth daily., Disp: , Rfl:    etodolac (LODINE) 400 MG tablet, TAKE 1 TABLET (400 MG TOTAL) BY MOUTH 2 (TWO) TIMES DAILY. SCHEDULE FOLLOW UP FOR FUTURE REFILLS, Disp: 60 tablet, Rfl: 3   famotidine (PEPCID) 40 MG tablet, Take 1 tablet (40 mg total) by mouth daily., Disp: 30 tablet, Rfl: 3   Ferrous Sulfate (IRON PO), Take 65 mg by mouth 2 (two) times daily., Disp: , Rfl:    ferrous sulfate 325 (65 FE) MG tablet, Take 325 mg by mouth 2 (two) times daily with a meal., Disp: , Rfl:    FOLIC ACID PO, Take 800 mcg by mouth daily., Disp: , Rfl:    gabapentin (NEURONTIN) 600 MG tablet, TAKE 1 TABLET BY MOUTH THREE TIMES A DAY, Disp: 90 tablet, Rfl: 0   HYDROcodone-acetaminophen (NORCO/VICODIN) 5-325 MG tablet, Take 1-2 tablets by mouth every 6 (six) hours as needed for moderate  pain (pain score 4-6)., Disp: 12 tablet, Rfl: 0   lamoTRIgine (LAMICTAL) 150 MG tablet, Take 1 tablet (150 mg total) by mouth 2 (two) times daily., Disp: 60 tablet, Rfl: 11   methylphenidate (RITALIN) 10 MG tablet, Take 1 tablet (10 mg total) by mouth 2 (two) times daily., Disp: 60 tablet, Rfl: 0   metoprolol succinate (TOPROL-XL) 25 MG 24 hr tablet, Take 1 tablet (25 mg total) by mouth daily., Disp: 90 tablet, Rfl: 1   Multiple Vitamin (MULTIVITAMIN) tablet, Take 1 tablet by mouth daily., Disp: , Rfl:    nortriptyline (  PAMELOR) 25 MG capsule, TAKE 1 CAPSULE BY MOUTH EVERYDAY AT BEDTIME, Disp: 90 capsule, Rfl: 4   ondansetron (ZOFRAN) 8 MG tablet, Take 1 tablet (8 mg total) by mouth every 8 (eight) hours as needed for nausea or vomiting., Disp: 20 tablet, Rfl: 3   pantoprazole (PROTONIX) 40 MG tablet, Take 40 mg by mouth daily., Disp: , Rfl:    tadalafil (CIALIS) 20 MG tablet, Take 1 tablet (20 mg total) by mouth daily as needed for erectile dysfunction., Disp: 12 tablet, Rfl: 5  PAST MEDICAL HISTORY: Past Medical History:  Diagnosis Date   Anxiety    Depression    Hearing loss    Hypersomnia    Hypertension    Rhinitis    Sleep apnea     PAST SURGICAL HISTORY: Past Surgical History:  Procedure Laterality Date   COCHLEAR IMPLANT  03/06/2021   DRUG INDUCED ENDOSCOPY N/A 03/23/2023   Procedure: DRUG INDUCED SLEEP ENDOSCOPY;  Surgeon: Christia Reading, MD;  Location: [facility];  Service: ENT;  Laterality: N/A;   IMPLANTATION OF HYPOGLOSSAL NERVE STIMULATOR Right 06/07/2023   Procedure: RIGHT IMPLANTATION OF HYPOGLOSSAL NERVE STIMULATOR;  Surgeon: Christia Reading, MD;  Location: [facility];  Service: ENT;  Laterality: Right;   TOE SURGERY     WISDOM TOOTH EXTRACTION      FAMILY HISTORY: Family History  Problem Relation Age of Onset   Arthritis Other    Depression Mother     SOCIAL HISTORY:  Social History   Socioeconomic History   Marital status:  Single    Spouse name: Not on file   Number of children: Not on file   Years of education: Not on file   Highest education level: Not on file  Occupational History   Not on file  Tobacco Use   Smoking status: Never   Smokeless tobacco: Never  Vaping Use   Vaping status: Never Used  Substance and Sexual Activity   Alcohol use: Yes    Comment: occ beer   Drug use: No   Sexual activity: Not on file  Other Topics Concern   Not on file  Social History Narrative   Right handed   2 cokes per    Pt disability   Pt lives alone    Social Drivers of Health   Financial Resource Strain: Not on file  Food Insecurity: Low Risk  (02/08/2023)   Received from [facility]   Hunger Vital Sign    Worried About Running Out of Food in the Last Year: Never true    Ran Out of Food in the Last Year: Never true  Transportation Needs: No Transportation Needs (02/08/2023)   Received from [facility]    In the past 12 months, has lack of reliable transportation kept you from medical appointments, meetings, work or from getting things needed for daily living? : No  Physical Activity: Not on file  Stress: Not on file  Social Connections: Not on file  Intimate Partner Violence: Not on file     PHYSICAL EXAM  Vitals:   08/30/23 1419  BP: (!) 136/96  Pulse: (!) 110  Weight: 185 lb (83.9 kg)  Height: 5\' 9"  (1.753 m)     Body mass index is 27.32 kg/m.   General: The patient is well-developed and well-nourished and in no acute distress  HEENT:  Head is [place]/AT.  Sclera are anicteric.   Neck/musculoskeletal: The neck is nontender with good range of motion.  He has some tenderness over the lumbar spine and paraspinal muscles  Skin: Extremities are without rash or  edema.  Neurologic Exam  Mental status: The patient is alert and oriented x 3 at the time of the examination. The patient has apparent normal recent and remote memory, with an apparently normal attention span  and concentration ability.   Speech is normal  Cranial nerves: Extraocular movements are full. Pupils are small and not definitely reactive.  Facial strength and sensation was normal.  Trapezius and sternocleidomastoid strength is normal. No dysarthria is noted.   He is deaf  Motor:  Muscle bulk is normal.   Tone is normal. Strength is  5 / 5 in all 4 extremities except 4/5 in the anke and toe extensors.   .   Reduced fine motor in hands.    Sensory: He has normal vibration sensation  in the knees and fingertips relative to the proximal arms.  He has only 20% sensation at the ankles and < 10% vibration sensation at the toes.  There is reduced pinprick sensation below the knee worse in the feet  Coordination: Cerebellar testing reveals good finger-nose-finger and heel-to-shin bilaterally.  Gait and station: Station is normal.   Gait is wide.. Cannot tandem walk.  .  Romberg is borderline..   Reflexes: Deep tendon reflexes are symmetric and normal in the arms but absent in the legs.   .      ASSESSMENT AND PLAN  CIDP (chronic inflammatory demyelinating polyneuropathy) (HCC)  S/P placement of hypoglossal nerve stimulator  Gait disorder  Foot-drop, unspecified laterality  Numbness   1.   He has an inflammatory polyneuropathy.  He started Vyvgart abd has a home nurse give the infusion.  He has had 5-6 injections so far and tolerates it well. 2.  He has OSA and unable to use CPAP.  He got the [facility] Device (Dr. Jenne Pane and Dr. Vickey Huger).  He feels it has helped.  He does not have cataplexy but has some other signs of possible narcolepsy..  Nortriptyline and Adderall have helped the sleepiness and the hypnagogic hallucinations.  3.    He will continue gabapentin.  Continue lamotrigine to 100 mg po bid 4.  Due to the foot drop caused by his CIDP, he would benefit from obtaining bilateral AFO.  These will help for the foot drops to prevent falls and help him walk longer.    Return to see [place]  in 4 months or sooner if there are new or worsening neurologic symptoms or based on the results of the tests  This visit is part of a comprehensive longitudinal care medical relationship regarding the patients primary diagnosis of CIDP and related concerns.   Jenness Stemler A. Epimenio Foot, MD, [facility] 08/30/2023, 2:44 PM Certified in Neurology, Clinical Neurophysiology, Sleep Medicine and Neuroimaging  [facility] [address], [address] [place], [place] [address] [phone]

## 2023-09-05 ENCOUNTER — Other Ambulatory Visit: Payer: Self-pay | Admitting: Neurology

## 2023-09-07 NOTE — Telephone Encounter (Signed)
 Dr. Godwin Lat- I was going to refill but I got this alert below. Ok to send in?

## 2023-09-10 ENCOUNTER — Other Ambulatory Visit: Payer: Self-pay | Admitting: Neurology

## 2023-09-13 ENCOUNTER — Other Ambulatory Visit: Payer: Self-pay | Admitting: Neurology

## 2023-09-13 NOTE — Telephone Encounter (Signed)
 Last seen on 08/30/23 Follow up scheduled on 10/14/23   See my chart message in this encounter.

## 2023-09-14 MED ORDER — METHYLPHENIDATE HCL 10 MG PO TABS: 10.0000 mg | ORAL_TABLET | Freq: Two times a day (BID) | ORAL | 0 refills | Status: DC

## 2023-09-14 NOTE — Telephone Encounter (Signed)
 Last seen on 08/30/23 Follow up scheduled on 10/14/23   Dispensed Days Supply Quantity Provider Pharmacy  METHYLPHENIDATE  10 MG TABLET 08/16/2023 30 60 each Sater, Sherida Dimmer, MD [facility] [phone] [facility] - ...      Rx pending to be signed

## 2023-10-05 ENCOUNTER — Telehealth: Payer: Self-pay | Admitting: *Deleted

## 2023-10-05 NOTE — Telephone Encounter (Signed)
 Prescriber plan of care faxed to [facility], faxed confirmation received [phone].  Copy placed in filing cabinet next to [employer]

## 2023-10-07 ENCOUNTER — Other Ambulatory Visit: Payer: Self-pay | Admitting: Neurology

## 2023-10-07 NOTE — Telephone Encounter (Signed)
 Last seen on 08/30/23  Follow up scheduled on 02/29/24 with Dr.Sater   Per 06/08/23 patient advise message

## 2023-10-10 ENCOUNTER — Encounter: Payer: Self-pay | Admitting: Neurology

## 2023-10-11 MED ORDER — METHYLPHENIDATE HCL 10 MG PO TABS: 10.0000 mg | ORAL_TABLET | Freq: Two times a day (BID) | ORAL | 0 refills | Status: DC

## 2023-10-11 NOTE — Telephone Encounter (Signed)
 Last seen on 08/30/23 Follow up scheduled on 02/29/24   METHYLPHENIDATE  10 MG TABLET 09/14/2023 30 60 each Sater, Sherida Dimmer, MD [facility] [phone] [facility] - ...    Rx pending to be filled on 10/14/23

## 2023-10-14 ENCOUNTER — Telehealth: Payer: Self-pay | Admitting: Neurology

## 2023-10-14 ENCOUNTER — Encounter: Payer: Self-pay | Admitting: Neurology

## 2023-10-14 ENCOUNTER — Ambulatory Visit (INDEPENDENT_AMBULATORY_CARE_PROVIDER_SITE_OTHER): Payer: Medicare Other | Admitting: Neurology

## 2023-10-14 VITALS — BP 158/96

## 2023-10-14 DIAGNOSIS — G6181 Chronic inflammatory demyelinating polyneuritis: Secondary | ICD-10-CM | POA: Diagnosis not present

## 2023-10-14 DIAGNOSIS — H838X3 Other specified diseases of inner ear, bilateral: Secondary | ICD-10-CM | POA: Diagnosis not present

## 2023-10-14 DIAGNOSIS — Z9682 Presence of neurostimulator: Secondary | ICD-10-CM

## 2023-10-14 NOTE — Progress Notes (Addendum)
 Provider:  Neomia Banner, MD  Primary Care Physician:  Mordechai April, DO [address] [place] [address]     Referring Provider: Dr Hortensia Ma, MD PhD           Chief Complaint according to patient   Patient presents with:                HISTORY OF PRESENT ILLNESS:  Tommy Ellison is a 48 y.o. male patient  of Dr Thom Fleeting  with autoimmune hearing loss, cochlear inplants, polyneuropathy, and OSA - he was not tolerating CPAP and opted for an inspire device - activated in 06-2023 .  He is here for revisit 10/14/2023 for  INSPIRE compliance and function testing.  Chief concern according to patient :  none.  Patient reportedly sleeps for 7 hours but needs a longer start delay, he is not asleep when the device stars working . We reset that from 30  to 45 He has advanced his settings since his activation visit in February and  at home he is using a 2.2 V setting. I had the opportunity to see his compliance report- excellent , daily user.  Started at 1.7 V and advanced by march 30 to 2.5 V strength -and then returned to 2.2V. for the last months from April 20th on.   Patient was seen today for a inspire follow up. Inspire rep was Sherline Distel                             He can now sleep supine an feels his airway is not obstructing. He lives alone but is sure that he snores much less.  He has few bathroom breaks and does not use the pause setting , goes easily back to sleep.   Review of Systems: Out of a complete 14 system review, the patient complains of only the following symptoms, and all other reviewed systems are negative.:   Severe hearing loss, acquired deafness-  autoimmune polyneuropathy. .was treated with IVIG and gabapentin .    How likely are you to doze in the following situations: 0 = not likely, 1 = slight chance, 2 = moderate chance, 3 = high chance  Sitting and Reading? Watching Television? Sitting inactive in a public place  (theater or meeting)? Lying down in the afternoon when circumstances permit? Sitting and talking to someone? Sitting quietly after lunch without alcohol? In a car, while stopped for a few minutes in traffic? As a passenger in a car for an hour without a break?  Total = 4 Epworth SS at 4 / 24 and FSS endorsed at 18 points/ 63.   He is sleepy by 6 or 7 PM already.       I have the pleasure of seeing Tommy Ellison 07/20/23 . He is a CIDP patient of Dr. Thom Fleeting who underwent a HST in March 2023.  The patient had fallen asleep in the infusion center and RN had witnessed apnea right there.  Sleep relevant medical history: Nocturia/ Enuresis , obesity, CIDP, gait impairment,  hearing loss.  Tommy Ellison is a 48 y.o. male patient who is seen upon referral from Dr. Godwin Lat on 07/20/2023 for Inspire activation.  I  Chief concern according to patient :  " I am sleepy and fatigued" I sleep  normally 10-12 hours from 11 PM until 4-5 AM and watch TV 1-2 hours  and sleep again until 11 AM.  I can't work anymore because of my legs and have time to sleep and nap. I was found to have sleep apnea but have  been unable to tolerate CPAP.    relevant medical history: Nocturia/ Enuresis , obesity, CIDP, gait impairment,  hearing loss.            Updat 04/27/2023, Dr Godwin Lat  Tommy Ellison is a 48 year old man with deafness and CIDP auto-immune polyneuropathy since 2020.  He feels strength and sensation are doing better.  He still has some weakness in his feet.   He can walk 1/2 a mile without a rest.   Sensation is better but he has a lot of foot pain still.     Due to transportation issues (he does not drive), he has found it difficult to do the IVIG.  Additionally, blood pressure sometimes increases during the infusions.   His IVIG dose is  130 g (65 g x 2 days) every 4 weeks.  He generally feels better for 3 weeks after each IVIG but then the 4th week he is more numb, more off balanced and has much more pain.   He also has dysesthetic pain in the feet with both a cold sensation and pins and needle sensation.   Before the IVIG we had tried prednisone  without benefit.   He takes gabapentin  3 times a day with some benefit  Tramadol  just helped a little bit.      OSA/narcolepsy:   HST 08/13/2021 showed moderate OSA with AHI 24.8/hr, REM AHI 33.7/hr and O2 nadir 85%. AutoPAP was tried but he had poor compliance.     APAP upset his stomach and he swallowed a lot of air.   .  When he did not have nausea, he felt better using CPAP.   He has dreams of animals attacking him.   He takes naps x 2 for 1 hour each.   He has had a few spells of sleep paralysis but has not had cataplexy (no spells associated with emotion).           Social History   Socioeconomic History   Marital status: Single    Spouse name: Not on file   Number of children: Not on file   Years of education: Not on file   Highest education level: Not on file  Occupational History   Not on file  Tobacco Use   Smoking status: Never   Smokeless tobacco: Never  Vaping Use   Vaping status: Never Used  Substance and Sexual Activity   Alcohol use: Yes    Comment: occ beer   Drug use: No   Sexual activity: Not on file  Other Topics Concern   Not on file  Social History Narrative   Right handed   2 cokes per    Pt disability   Pt lives alone    Social Drivers of Health   Financial Resource Strain: Not on file  Food Insecurity: Low Risk  (02/08/2023)   Received from [facility]   Hunger Vital Sign    Worried About Running Out of Food in the Last Year: Never true    Ran Out of Food in the Last Year: Never true  Transportation Needs: No Transportation Needs (02/08/2023)   Received from [facility]    In the past 12 months, has lack of reliable transportation kept you from medical appointments, meetings, work or from getting things needed  for daily living? : No  Physical Activity: Not on file  Stress: Not on  file  Social Connections: Not on file    Family History  Problem Relation Age of Onset   Arthritis Other    Depression Mother     Past Medical History:  Diagnosis Date   Anxiety    Depression    Hearing loss    Hypersomnia    Hypertension    Rhinitis    Sleep apnea     Past Surgical History:  Procedure Laterality Date   COCHLEAR IMPLANT  03/06/2021   DRUG INDUCED ENDOSCOPY N/A 03/23/2023   Procedure: DRUG INDUCED SLEEP ENDOSCOPY;  Surgeon: Virgina Grills, MD;  Location: [facility];  Service: ENT;  Laterality: N/A;   IMPLANTATION OF HYPOGLOSSAL NERVE STIMULATOR Right 06/07/2023   Procedure: RIGHT IMPLANTATION OF HYPOGLOSSAL NERVE STIMULATOR;  Surgeon: Virgina Grills, MD;  Location: [facility];  Service: ENT;  Laterality: Right;   TOE SURGERY     WISDOM TOOTH EXTRACTION       Current Outpatient Medications on File Prior to Visit  Medication Sig Dispense Refill   acetaminophen  (TYLENOL ) 500 MG tablet Take 500 mg by mouth every 6 (six) hours as needed.     Cholecalciferol (VITAMIN D3 PO) Take 5,000 Units by mouth daily.     citalopram  (CELEXA ) 20 MG tablet Take 1 tablet (20 mg total) by mouth daily. 90 tablet 1   Cyanocobalamin  (VITAMIN B-12 PO) Take 1,000 mcg by mouth daily.     etodolac  (LODINE ) 400 MG tablet TAKE 1 TABLET (400 MG TOTAL) BY MOUTH 2 (TWO) TIMES DAILY. SCHEDULE FOLLOW UP FOR FUTURE REFILLS 60 tablet 3   famotidine  (PEPCID ) 40 MG tablet Take 1 tablet (40 mg total) by mouth daily. 30 tablet 3   Ferrous Sulfate (IRON PO) Take 65 mg by mouth 2 (two) times daily.     ferrous sulfate 325 (65 FE) MG tablet Take 325 mg by mouth 2 (two) times daily with a meal.     FOLIC ACID  PO Take 800 mcg by mouth daily.     gabapentin  (NEURONTIN ) 600 MG tablet TAKE 1 TABLET BY MOUTH THREE TIMES A DAY 90 tablet 0   HYDROcodone -acetaminophen  (NORCO/VICODIN) 5-325 MG tablet Take 1-2 tablets by mouth every 6 (six) hours as needed for moderate pain (pain score  4-6). 12 tablet 0   lamoTRIgine  (LAMICTAL ) 150 MG tablet Take 1 tablet (150 mg total) by mouth 2 (two) times daily. 60 tablet 11   methylphenidate  (RITALIN ) 10 MG tablet Take 1 tablet (10 mg total) by mouth 2 (two) times daily. 60 tablet 0   metoprolol  succinate (TOPROL -XL) 50 MG 24 hr tablet Take 1 tablet (50 mg total) by mouth daily. 90 tablet 3   Multiple Vitamin (MULTIVITAMIN) tablet Take 1 tablet by mouth daily.     nortriptyline  (PAMELOR ) 25 MG capsule TAKE 2 CAPSULES (50 MG TOTAL) BY MOUTH EVERY DAY AT BEDTIME 180 capsule 3   ondansetron  (ZOFRAN ) 8 MG tablet Take 1 tablet (8 mg total) by mouth every 8 (eight) hours as needed for nausea or vomiting. 20 tablet 3   pantoprazole  (PROTONIX ) 40 MG tablet Take 40 mg by mouth daily.     tadalafil  (CIALIS ) 20 MG tablet Take 1 tablet (20 mg total) by mouth daily as needed for erectile dysfunction. 12 tablet 5   No current facility-administered medications on file prior to visit.    No Known Allergies   DIAGNOSTIC DATA (LABS,  IMAGING, TESTING) - I reviewed patient records, labs, notes, testing and imaging myself where available.  Lab Results  Component Value Date   WBC 6.8 11/10/2022   HGB 13.4 11/10/2022   HCT 38.0 11/10/2022   MCV 108 (H) 11/10/2022   PLT 295 11/10/2022      Component Value Date/Time   NA 125 (L) 01/18/2023 1156   K 4.4 01/18/2023 1156   CL 90 (L) 01/18/2023 1156   CO2 20 01/18/2023 1156   GLUCOSE 86 01/18/2023 1156   GLUCOSE 105 (H) 04/30/2011 1109   BUN 4 (L) 01/18/2023 1156   CREATININE 0.59 (L) 01/18/2023 1156   CALCIUM 8.8 01/18/2023 1156   PROT 9.9 (H) 08/13/2021 1438   ALBUMIN 4.4 01/18/2023 1156   AST CANCELED 08/13/2021 1438   ALT 22 08/13/2021 1438   ALKPHOS 100 08/13/2021 1438   BILITOT 1.1 08/13/2021 1438   GFRNONAA >90 04/30/2011 1109   GFRAA >90 04/30/2011 1109   No results found for: "CHOL", "HDL", "LDLCALC", "LDLDIRECT", "TRIG", "CHOLHDL" No results found for: "HGBA1C" No results found  for: "VITAMINB12" No results found for: "TSH"  PHYSICAL EXAM:  Today's Vitals   10/14/23 1058  BP: (!) 158/96   There is no height or weight on file to calculate BMI.   Wt Readings from Last 3 Encounters:  08/30/23 185 lb (83.9 kg)  07/20/23 196 lb (88.9 kg)  06/07/23 (P) 188 lb 7.9 oz (85.5 kg)     Ht Readings from Last 3 Encounters:  08/30/23 5\' 9"  (1.753 m)  07/20/23 5\' 9"  (1.753 m)  06/07/23 (P) 5\' 9"  (1.753 m)      General: The patient is awake, alert and appears not in acute distress. The patient is well groomed. Head: Normocephalic, atraumatic. Neck is supple.  Mallampati 3,  neck circumference:18" inches .  Nasal airflow patent.  Retrognathia is not seen.  Dental status:  biological  Trunk: The patient's posture is erect.   NEUROLOGIC EXAM: The patient is awake and alert, oriented to place and time.   Memory subjective described as intact.  Attention span & concentration ability appears normal.  Speech is fluent, but impaired by hearing deficit.   Mood and affect are appropriate.   Cranial nerves: no loss of smell or taste reported  Pupils are equal and briskly reactive to light.    ASSESSMENT AND PLAN:   48 y.o. year old male  here with:    Current  Inspire setting at 2.2 V - will continue to increase weekly as tolerated- can go up to 2.4 V .  Sleep study for INSPIRE in lab titration 10-25-2023.  Revisit with me June 11 th 2025.  We changed the therapy delay time.  We could not decrease the pause time and had to leave it at 15 minutes -there is no shorter setting.    I would like to thank Mordechai April, DO and Dr Godwin Lat for allowing me to meet with and to take care of this pleasant patient.   After spending a total time of  25  minutes face to face and additional time for physical and neurologic examination, review of laboratory studies,  personal review of imaging studies, reports and results of other testing and review of referral information /  records as far as provided in visit,   Electronically signed by: Neomia Banner, MD 10/14/2023 11:34 AM  [facility] Board certified by The [employer] of Sleep Medicine and Diplomate of the [employer]  of Sleep Medicine. Board certified In Neurology through the ABPN, Fellow of the [employer] of Neurology.           [email or web address]

## 2023-10-14 NOTE — Telephone Encounter (Signed)
 Patient was seen today for a inspire follow up. Inspire rep was [employer]

## 2023-10-14 NOTE — Patient Instructions (Signed)
 ASSESSMENT AND PLAN:   48 y.o. year old male inspire patient with OSA , here with:  subjective report of benefit from using INSPIRE.     Current Inspire setting at 2.2 V , he advanced at home from the activation setting at 1.7 V- and - will continue to increase weekly as tolerated- can go up to 2.4 V .  Sleep study for INSPIRE in lab titration 10-25-2023.  Revisit with me June 11 th 2025.  We changed the therapy delay time.  We could not decrease the pause time and had to leave it at 15 minutes -there is no shorter setting.    I would like to thank Tommy April, DO and Dr Tommy Ellison for allowing me to meet with and to take care of this pleasant patient.

## 2023-10-25 ENCOUNTER — Other Ambulatory Visit: Payer: Self-pay | Admitting: Family Medicine

## 2023-10-25 ENCOUNTER — Ambulatory Visit (INDEPENDENT_AMBULATORY_CARE_PROVIDER_SITE_OTHER): Payer: Medicare Other | Admitting: Neurology

## 2023-10-25 DIAGNOSIS — R400 Somnolence: Secondary | ICD-10-CM

## 2023-10-25 DIAGNOSIS — Z9682 Presence of neurostimulator: Secondary | ICD-10-CM | POA: Diagnosis not present

## 2023-10-25 DIAGNOSIS — G4733 Obstructive sleep apnea (adult) (pediatric): Secondary | ICD-10-CM | POA: Diagnosis not present

## 2023-10-25 DIAGNOSIS — R296 Repeated falls: Secondary | ICD-10-CM

## 2023-10-25 DIAGNOSIS — R222 Localized swelling, mass and lump, trunk: Secondary | ICD-10-CM

## 2023-10-25 DIAGNOSIS — G6181 Chronic inflammatory demyelinating polyneuritis: Secondary | ICD-10-CM

## 2023-10-25 DIAGNOSIS — R1310 Dysphagia, unspecified: Secondary | ICD-10-CM

## 2023-10-27 ENCOUNTER — Telehealth: Payer: Self-pay | Admitting: Neurology

## 2023-10-27 NOTE — Telephone Encounter (Signed)
 LVM and sent mychart msg informing pt of time change for 6/11 appt- MD in meeting.

## 2023-10-31 ENCOUNTER — Ambulatory Visit: Payer: Self-pay | Admitting: Neurology

## 2023-10-31 NOTE — Procedures (Signed)
 Name: Adewale, Pucillo BMI: 66.44 Physician: Neomia Banner, MD  ID: [id number] Height: 69.0 in Technician: Octavia Belton  Sex: Male Weight: 196.0 lbs Record: [id number]  Age: 48 [1975-10-17] Date: 10/25/2023 Scorer: Octavia Belton  Medical & Medication History      IMARI SIVERTSEN is a 48 y.o. male patient of Dr Thom Fleeting with CIDP-autoimmune hearing loss, cochlear implants, CID - STUDY DATE: 10/25/2023      PATIENT NAME:  Tommy Ellison         DATE OF BIRTH:  25-Dec-1975  PATIENT ID:  [id number]    TYPE OF STUDY:  Suzan Erm.  PHYSICIAN: Neomia Banner, MD ATTENDING PHYSICIAN : Hortensia Ma , MD PHD  SCORING TECHNICIAN: Octavia Belton, RPSGT   HISTORY: This 48 year-old Male has an autoimmune mediated o demyelinating polyneuropathy.  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 he was unable to use CPAP. The Epworth Sleepiness Scale was 22 / 24 (scores above or equal to 10 are suggestive of hypersomnolence).  DESCRIPTION: A sleep technologist was in attendance for the duration of the recording.  Data collection, scoring, video monitoring, and reporting were performed in compliance with the AASM Manual for the Scoring of Sleep and Associated Events; (Hypopnea is scored based on the criteria listed in Section VIII D. 1b in the AASM Manual V2.6 using a 4% oxygen desaturation rule or Hypopnea is scored based on the criteria listed in Section VIII D. 1a in the AASM Manual V2.6 using 3% oxygen desaturation and /or arousal rule).  A physician certified by the American Board of Sleep Medicine reviewed each epoch of the study.  ADDITIONAL INFORMATION:  Height: 69.0 in Weight: 196 lb (BMI 28) Neck Size: 18.0 in    MEDICATIONS: Tylenol , Vitamin D3, Celexa , Vitamin B12, Lodine , Pepcid , Iron, Folic Acid , Neurontin , Norco, Lamictal , Ritalin , Toprol -XL, Multivitamins, Pamelor , Zofran , Protonix , Cialis    SLEEP CONTINUITY AND SLEEP  ARCHITECTURE:  Lights off was at 21:26: and lights on 04:51: (7.4 hours in bed). Total sleep time was 329.0 minutes (84.3% supine;  15.7% lateral;  0.0% prone sleep), with a decreased sleep efficiency at 73.9%.  Sleep latency was normal at 25.0 minutes.   The Voltage setting on INSPIRE was started at  2.2V and  did not improve  the moderate snoring at all, increasing V to a final setting of 3 V still didn't control  snoring.  There was no AHI control achieved at any voltage  setting !    Of the total sleep time, the percentage of stage N1 sleep was 23.1%, stage N2 sleep was 74.6%, stage N3 sleep was 0.0%, and REM sleep was reduced at 2.3%. There was 1 Stage R period observed on this study night, 69 awakenings (i.e. transitions to Stage W from any sleep stage), and 199.0 total stage transitions.  Wake after sleep onset (WASO) time accounted for 91 minutes.  AROUSAL: There was an arousal index of 26.4 arousals/hour.  Of these, 149 were identified as respiratory-related arousals (27.2 /h), 0 were PLM-related arousals (0.0 /h), and 31 were non-specific arousals (5.7 /h)  RESPIRATORY MONITORING:  Based on CMS criteria (using a 4% oxygen desaturation rule for scoring hypopneas), there were 239 apneas (237 obstructive; 0 central; 2 mixed), and 80 hypopneas.   Apnea index was 43.6.  Hypopnea index was 14.6.  The AHI  (apnea-hypopnea index)  was 58.2/h overall (59.5 supine, 0.0 non-supine; 48.0 REM, 48.0 supine REM).  There were 0  respiratory effort-related arousals (RERAs) 48.0 /h, supine REM RDI 48.0 /h.   Based on AASM criteria (using a 3% oxygen desaturation and /or arousal rule for scoring hypopneas), there were 239 apneas (237 obstructive; 0 central; 2 mixed), and 92 hypopneas. Apnea index was 43.6. Hypopnea index was 16.8.  The apnea-hypopnea index was 60.4/h overall (60.8 supine, 0.0 non-supine; 48.0 REM, 48.0 supine REM). There were 0 respiratory effort-related arousals (RERAs).   The RERA index was  0.0 events/h. Total respiratory disturbance index (RDI) was 60.4 events/h. RDI results showed: supine RDI  60.8 /h; non-supine RDI 58.3 /h; REM RDI 48.0 /h, supine REM RDI 48.0 /h.  Respiratory events were associated with oxyhemoglobin desaturations  to  nadir while asleep at  55% from a mean of 89%.  OXIMETRY: Total sleep time spent at, or below 88% was 104.8 minutes, or 31.9% of total sleep time.  BODY POSITION: Duration of total sleep and percent of total sleep in their respective position is as follows: supine 277 minutes (84.3%), non-supine 51.5 minutes (15.7%); right 51 minutes (15.7%), left 00 minutes (0.0%), and prone 00 minutes (0.0%). Total supine REM sleep time was 07 minutes (100.0% of total REM sleep). LIMB MOVEMENTS: There were 0 periodic limb movements of sleep (0.0/h), of which 0 (0.0/h) were associated with an arousal. CARDIAC: The electrocardiogram documented NSR   The average heart rate during sleep was 84 bpm.  The maximum heart rate during sleep was 91 bpm. The maximum heart rate during recording was 111.      IMPRESSION:   Sleep-disordered breathing  did not improve under INSPIRE at various tried settings. The patient's baseline study showed a lower AHI than any AHI seen during the INSPIRE treatment titration.  2. Total sleep time was within normal limits.       Recommended Settings:  none identified.   This patient will have to return for a change in electrode settings, not just voltage.    Neomia Banner,  MD           polyneuropathy, and OSA - he was not tolerating CPAP and opted for an inspire device - activated in 06-2023 . He is here for revisit 10/14/2023 for INSPIRE compliance and function testing. Chief concern : none. Patient reportedly sleeps for 7 hours but needs a longer start delay, he is not asleep when the device starts working . We reset Start delay  from 30 to 45 minutes-  He has advanced his settings since his activation visit in February and at  home he is using a 2.2 V setting. I had the opportunity to see his compliance report- excellent , daily user. Started at 1.7 V and advanced by March 30th  to 2.5 V strength -and then returned to 2.2V. for the last months from April 20th on.   Tylenol , Vitamin D3, Celexa , Vitamin B12, Lodine , Pepcid , Iron, Folic Acid , Neurontin , Norco, Lamictal , Ritalin , Toprol -XL, Multivitamins, Pamelor , Zofran , Protonix , Cialis    Sleep Disorder      Comments   Patient arrived for a therapeutic Inspire Fine Tune polysomnogram. Procedure explained and all questions answered. Standard paste setup without complications. Patient slept supine and right. Mild to loud snoring was noted. Respiratory events observed, worse while supine. Inspire was started at 2.2 volts, and increased to 3.0 volts in an attempt to control obstructive respiratory events and abolish snoring. After speaking with tech support during the night, we turned down Inspire to 1.4 volts to check for over stimulation. Then increased up to 2.2  volts, in an effort to control obstructive respiratory events and abolish snoring. No significant cardiac arrhythmias noted. No significant PLMS observed. Two restroom visits.    Lights out: 09:26:35 PM Lights on: 04:51:19 AM   Time Total Supine Side Prone Upright  Recording (TRT) 7h 25.35m 5h 57.37m 1h 27.74m 0h 0.1m 0h 0.10m  Sleep (TST) 5h 29.57m 4h 37.24m 0h 51.10m 0h 0.104m 0h 0.6m   Latency N1 N2 N3 REM Onset Per. Slp. Eff.  Actual 0h 0.37m 0h 3.70m 0h 0.44m 4h 39.11m 0h 25.78m 0h 25.20m 73.93%   Stg Dur Wake N1 N2 N3 REM  Total 116.0 76.0 245.5 0.0 7.5  Supine 80.0 66.0 204.0 0.0 7.5  Side 36.0 10.0 41.5 0.0 0.0  Prone 0.0 0.0 0.0 0.0 0.0  Upright 0.0 0.0 0.0 0.0 0.0   Stg % Wake N1 N2 N3 REM  Total 26.1 23.1 74.6 0.0 2.3  Supine 18.0 20.1 62.0 0.0 2.3  Side 8.1 3.0 12.6 0.0 0.0  Prone 0.0 0.0 0.0 0.0 0.0  Upright 0.0 0.0 0.0 0.0 0.0     Apnea Summary Sub Supine Side Prone Upright  Total 239 Total 239 215 24 0  0    REM 4 4 0 0 0    NREM 235 211 24 0 0  Obs 237 REM 4 4 0 0 0    NREM 233 209 24 0 0  Mix 2 REM 0 0 0 0 0    NREM 2 2 0 0 0  Cen 0 REM 0 0 0 0 0    NREM 0 0 0 0 0   Rera Summary Sub Supine Side Prone Upright  Total 0 Total 0 0 0 0 0    REM 0 0 0 0 0    NREM 0 0 0 0 0   Hypopnea Summary Sub Supine Side Prone Upright  Total 92 Total 92 66 26 0 0    REM 2 2 0 0 0    NREM 90 64 26 0 0   4% Hypopnea Summary Sub Supine Side Prone Upright  Total (4%) 80 Total 80 60 20 0 0    REM 2 2 0 0 0    NREM 78 58 20 0 0     AHI Total Obs Mix Cen  60.36 Apnea 43.59 43.22 0.36 0.00   Hypopnea 16.78 -- -- --  58.18 Hypopnea (4%) 14.59 -- -- --    Total Supine Side Prone Upright  Position AHI 60.36 60.76 58.25 0.00 0.00  REM AHI 48.00   NREM AHI 60.65   Position RDI 60.36 60.76 58.25 0.00 0.00  REM RDI 48.00   NREM RDI 60.65    4% Hypopnea Total Supine Side Prone Upright  Position AHI (4%) 58.18 59.46 51.26 0.00 0.00  REM AHI (4%) 48.00   NREM AHI (4%) 58.41   Position RDI (4%) 58.18 59.46 51.26 0.00 0.00  REM RDI (4%) 48.00   NREM RDI (4%) 58.41    Desaturation Information Threshold: 2% <100% <90% <80% <70% <60% <50% <40%  Supine 340.0 259.0 133.0 24.0 1.0 0.0 0.0  Side 73.0 34.0 5.0 0.0 0.0 0.0 0.0  Prone 0.0 0.0 0.0 0.0 0.0 0.0 0.0  Upright 0.0 0.0 0.0 0.0 0.0 0.0 0.0  Total 413.0 293.0 138.0 24.0 1.0 0.0 0.0  Index 59.1 41.9 19.7 3.4 0.1 0.0 0.0   Threshold: 3% <100% <90% <80% <70% <60% <50% <40%  Supine 298.0 258.0 133.0 24.0 1.0 0.0 0.0  Side 58.0 34.0 5.0 0.0 0.0 0.0 0.0  Prone 0.0 0.0 0.0 0.0 0.0 0.0 0.0  Upright 0.0 0.0 0.0 0.0 0.0 0.0 0.0  Total 356.0 292.0 138.0 24.0 1.0 0.0 0.0  Index 50.9 41.8 19.7 3.4 0.1 0.0 0.0   Threshold: 4% <100% <90% <80% <70% <60% <50% <40%  Supine 275.0 256.0 133.0 24.0 1.0 0.0 0.0  Side 47.0 33.0 5.0 0.0 0.0 0.0 0.0  Prone 0.0 0.0 0.0 0.0 0.0 0.0 0.0  Upright 0.0 0.0 0.0 0.0 0.0 0.0 0.0  Total 322.0 289.0 138.0 24.0 1.0 0.0 0.0   Index 46.1 41.3 19.7 3.4 0.1 0.0 0.0   Threshold: 3% <100% <90% <80% <70% <60% <50% <40%  Supine 298 258 133 24 1 0 0  Side 58 34 5 0 0 0 0  Prone 0 0 0 0 0 0 0  Upright 0 0 0 0 0 0 0  Total 356 292 138 24 1 0 0   Awakening/Arousal Information # of Awakenings 69  Wake after sleep onset 91.52m  Wake after persistent sleep 91.27m   Arousal Assoc. Arousals Index  Apneas 127 23.2  Hypopneas 22 4.0  Leg Movements 0 0.0  Snore 0 0.0  PTT Arousals 0 0.0  Spontaneous 31 5.7  Total 180 32.8  Leg Movement Information PLMS LMs Index  Total LMs during PLMS 0 0.0  LMs w/ Microarousals 0 0.0   LM LMs Index  w/ Microarousal 0 0.0  w/ Awakening 0 0.0  w/ Resp Event 0 0.0  Spontaneous 0 0.0  Total 0 0.0     Desaturation threshold setting: 3% Minimum desaturation setting: 10 seconds SaO2 nadir: 55% The longest event was a 69 sec obstructive Apnea with a minimum SaO2 of 71%. The lowest SaO2 was 59% associated with a 44 sec obstructive Apnea. EKG Rates EKG Avg Max Min  Awake 85 111 78  Asleep 84 91 77  EKG       General Information: Attending  Hortensia Ma MD, PhD   Name: Gatsby, Chismar BMI: 28 Physician: Neomia Banner, MD  ID: [id number] Height: 69 in Technician: Octavia Belton  Sex: Male Weight: 196 lbs Record: [id number]  Age: 67 [05-12-1976] Date: 10/25/2023 Scorer: Octavia Belton   Recommended Settings IPAP: N/A cmH20 EPAP: N/A cmH2O AHI: N/A AHI (4%): N/A   Inspire Voltage 00 1.4 2.2 2.3 2.4 2.6 2.7 2.8   O2 Vol 0.0 0.0 0.0 0.0 0.0 0.0 0.0 0.0  Time TRT 97.85m 15.43m 9.31m 15.13m 19.75m 37.36m 14.78m 15.19m   TST 23.22m 14.93m 9.53m 15.70m 12.66m 30.44m 14.45m 14.53m  Sleep Stage % Wake 75.8 6.5 0.0 0.0 36.8 18.7 0.0 3.3   % REM 0.0 0.0 0.0 0.0 0.0 0.0 0.0 0.0   % N1 53.2 17.2 0.0 0.0 70.8 31.1 0.0 3.4   % N2 46.8 82.8 100.0 100.0 29.2 68.9 100.0 96.6   % N3 0.0 0.0 0.0 0.0 0.0 0.0 0.0 0.0  Respiratory Total Events 29 13 13 17 14  36 11 12   Obs. Apn. 23 12 4 12 7 20 8 5     Mixed Apn. 0 0 0 0 1 0 0 0   Cen. Apn. 0 0 0 0 0 0 0 0   Hypopneas 6 1 9 5 6 16 3 7    AHI 74.04 53.79 82.11 68.00 70.00 70.82 47.14 49.66   Supine AHI 80.00 53.79 82.11 68.00 70.00 74.12 0.00 51.43   Prone AHI 0.00 0.00 0.00 0.00 0.00 0.00 0.00 0.00  Side AHI 69.23 0.00 0.00 0.00 0.00 66.67 47.14 49.09  Respiratory (4%) Hypopneas (4%) 3.00 1.00 9.00 5.00 5.00 13.00 3.00 7.00   AHI (4%) 66.38 53.79 82.11 68.00 65.00 64.92 47.14 49.66   Supine AHI (4%) 80.00 53.79 82.11 68.00 65.00 74.12 0.00 51.43   Prone AHI (4%) 0.00 0.00 0.00 0.00 0.00 0.00 0.00 0.00   Side AHI (4%) 55.38 0.00 0.00 0.00 0.00 53.33 47.14 49.09  Desat Profile <= 90% 17.45m 7.55m 4.2m 7.37m 5.53m 9.67m 5.65m 5.55m   <= 80% 12.57m 3.58m 0.22m 1.49m 1.41m 1.61m 0.46m 0.35m   <= 70% 12.74m 0.59m 0.31m 0.50m 0.26m 0.69m 0.38m 0.64m   <= 60% 12.60m 0.109m 0.98m 0.64m 0.59m 0.52m 0.72m 0.8m  Arousal Index Apnea 28.1 24.8 12.6 20.0 30.0 31.5 4.3 8.3   Hypopnea 5.1 4.1 0.0 4.0 10.0 9.8 0.0 0.0   LM 0.0 0.0 0.0 0.0 0.0 0.0 0.0 0.0   Spontaneous 20.4 4.1 0.0 4.0 10.0 7.9 0.0 4.1   Inspire Voltage 2.8 2.9 3.0 1.5 1.6 1.7 1.8 1.9   O2 Vol 0.0 0.0 0.0 0.0 0.0 0.0 0.0 0.0  Time TRT 15.86m 41.46m 18.9m 18.26m 24.32m 22.68m 27.53m 8.46m   TST 15.81m 36.56m 13.110m 18.58m 22.27m 22.26m 20.69m 6.29m  Sleep Stage % Wake 3.2 11.0 25.0 2.7 10.2 0.0 25.5 18.8   % REM 0.0 0.0 0.0 11.1 0.0 0.0 4.9 0.0   % N1 3.3 12.3 22.2 19.4 6.8 0.0 73.2 23.1   % N2 96.7 87.7 77.8 69.4 93.2 100.0 22.0 76.9   % N3 0.0 0.0 0.0 0.0 0.0 0.0 0.0 0.0  Respiratory Total Events 15 36 14 16 18 20 25 6    Obs. Apn. 8 25 14 16 11 17 22 6    Mixed Apn. 0 0 0 0 0 0 0 0   Cen. Apn. 0 0 0 0 0 0 0 0   Hypopneas 7 11 0 0 7 3 3  0   AHI 60.00 59.18 62.22 53.33 49.09 54.55 73.17 55.38   Supine AHI 60.00 59.18 62.22 53.33 49.09 54.55 73.17 55.38   Prone AHI 0.00 0.00 0.00 0.00 0.00 0.00 0.00 0.00   Side AHI 0.00 0.00 0.00 0.00 0.00 0.00 0.00 0.00  Respiratory (4%) Hypopneas (4%) 7.00 10.00 0.00 0.00 4.00 2.00 3.00  0.00   AHI (4%) 60.00 57.53 62.22 53.33 40.91 51.82 73.17 55.38   Supine AHI (4%) 60.00 57.53 62.22 53.33 40.91 51.82 73.17 55.38   Prone AHI (4%) 0.00 0.00 0.00 0.00 0.00 0.00 0.00 0.00   Side AHI (4%) 0.00 0.00 0.00 0.00 0.00 0.00 0.00 0.00  Desat Profile <= 90% 5.64m 16.39m 7.90m 9.10m 6.75m 7.29m 15.42m 5.26m   <= 80% 0.53m 2.83m 1.29m 3.31m 1.68m 0.49m 5.14m 2.25m   <= 70% 0.71m 0.51m 0.63m 0.25m 0.51m 0.6m 0.51m 0.23m   <= 60% 0.37m 0.9m 0.54m 0.34m 0.37m 0.68m 0.28m 0.68m  Arousal Index Apnea 20.0 21.4 53.3 6.7 16.4 2.7 29.3 55.4   Hypopnea 4.0 1.6 0.0 0.0 0.0 0.0 8.8 0.0   LM 0.0 0.0 0.0 0.0 0.0 0.0 0.0 0.0   Spontaneous 4.0 4.9 8.9 3.3 5.5 0.0 2.9 0.0   Inspire Voltage 2.0 2.1 2.2   O2 Vol 0.0 0.0 0.0  Time TRT 10.31m 26.58m 11.60m   TST 8.76m 23.44m 9.61m  Sleep Stage % Wake 15.0 9.6 17.4   % REM 23.5 0.0 26.3   % N1 64.7 21.3 26.3   % N2 11.8 78.7 47.4   % N3 0.0  0.0 0.0  Respiratory Total Events 7 23 6    Obs. Apn. 2 19 6    Mixed Apn. 1 0 0   Cen. Apn. 0 0 0   Hypopneas 4 4 0   AHI 49.41 58.72 37.89   Supine AHI 49.41 58.72 37.89   Prone AHI 0.00 0.00 0.00   Side AHI 0.00 0.00 0.00  Respiratory (4%) Hypopneas (4%) 4.00 4.00 0.00   AHI (4%) 49.41 58.72 37.89   Supine AHI (4%) 49.41 58.72 37.89   Prone AHI (4%) 0.00 0.00 0.00   Side AHI (4%) 0.00 0.00 0.00  Desat Profile <= 90% 6.45m 14.21m 6.91m   <= 80% 3.56m 4.68m 3.95m   <= 70% 0.6m 0.60m 1.106m   <= 60% 0.38m 0.5m 0.24m  Arousal Index Apnea 28.2 35.7 31.6   Hypopnea 21.2 7.7 0.0   LM 0.0 0.0 0.0   Spontaneous 0.0 5.1 12.6

## 2023-11-02 ENCOUNTER — Ambulatory Visit
Admission: RE | Admit: 2023-11-02 | Discharge: 2023-11-02 | Disposition: A | Discharge status: Home / Self Care | Source: Ambulatory Visit | Attending: Family Medicine

## 2023-11-02 DIAGNOSIS — R222 Localized swelling, mass and lump, trunk: Secondary | ICD-10-CM

## 2023-11-02 MED ORDER — IOPAMIDOL (ISOVUE-300) INJECTION 61%
75.0000 mL | Freq: Once | INTRAVENOUS | Status: AC | PRN
  Administered 2023-11-02: 75 mL via INTRAVENOUS

## 2023-11-03 ENCOUNTER — Ambulatory Visit (INDEPENDENT_AMBULATORY_CARE_PROVIDER_SITE_OTHER): Admitting: Neurology

## 2023-11-03 ENCOUNTER — Telehealth: Payer: Self-pay | Admitting: Neurology

## 2023-11-03 ENCOUNTER — Encounter: Payer: Self-pay | Admitting: Neurology

## 2023-11-03 VITALS — BP 132/62 | Wt 186.4 lb

## 2023-11-03 DIAGNOSIS — G4733 Obstructive sleep apnea (adult) (pediatric): Secondary | ICD-10-CM | POA: Diagnosis not present

## 2023-11-03 DIAGNOSIS — G6181 Chronic inflammatory demyelinating polyneuritis: Secondary | ICD-10-CM | POA: Diagnosis not present

## 2023-11-03 DIAGNOSIS — Z9682 Presence of neurostimulator: Secondary | ICD-10-CM | POA: Diagnosis not present

## 2023-11-03 NOTE — Progress Notes (Addendum)
 Provider:  Neomia Banner, MD  Primary Care Physician:  Mordechai April, DO [address] [place] [address]     Referring Provider: Mordechai April, Do [address] [place],  [place] [address]          Chief Complaint according to patient   Patient presents with:                HISTORY OF PRESENT ILLNESS:  Tommy Ellison is a 48 y.o. male patient who is here for revisit 11/03/2023 for  ELECTRODE  ARRANGEMENT , ELECTRODE B 1.3- 1.9 Volts.   [name] showed better lift and protrusion. Follow up in 8 weeks.  DISCUSSED THE SLEEP STUDY FROM 6--06-2023     This 48 year-old Male has an autoimmune mediated o demyelinating polyneuropathy.  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 he was unable to use CPAP. The Epworth Sleepiness Scale was 22 / 24 (scores above or equal to 10 are suggestive of hypersomnolence).   IMPRESSION:   Sleep-disordered breathing  did not improve under INSPIRE at various tried settings. The patient's baseline study showed a lower AHI than any AHI seen during the INSPIRE treatment titration.  2. Total sleep time was within normal limits.       Recommended Settings:  none identified.    This patient will have to return for a change in electrode settings, not just voltage.     Neomia Banner,  MD            Sherline Distel & Larinda Plover the [place] reps were here.                             Review of Systems: Out of a complete 14 system review, the patient complains of only the following symptoms, and all other reviewed systems are negative.:   Social History   Socioeconomic History   Marital status: Single    Spouse name: Not on file   Number of children: Not on file   Years of education: Not on file   Highest education level: Not on file  Occupational History   Not on file  Tobacco Use   Smoking status: Never   Smokeless tobacco: Never  Vaping Use   Vaping  status: Never Used  Substance and Sexual Activity   Alcohol use: Yes    Comment: occ beer   Drug use: No   Sexual activity: Not on file  Other Topics Concern   Not on file  Social History Narrative   Right handed   2 cokes per    Pt disability   Pt lives alone    Social Drivers of Health   Financial Resource Strain: Not on file  Food Insecurity: Low Risk  (02/08/2023)   Received from [facility]   Hunger Vital Sign    Worried About Running Out of Food in the Last Year: Never true    Ran Out of Food in the Last Year: Never true  Transportation Needs: No Transportation Needs (02/08/2023)   Received from [facility]    In the past 12 months, has lack of reliable transportation kept you from medical appointments, meetings, work or from getting things needed for daily living? : No  Physical Activity: Not on file  Stress: Not on file  Social Connections: Not on file  Family History  Problem Relation Age of Onset   Arthritis Other    Depression Mother     Past Medical History:  Diagnosis Date   Anxiety    Depression    Hearing loss    Hypersomnia    Hypertension    Rhinitis    Sleep apnea     Past Surgical History:  Procedure Laterality Date   COCHLEAR IMPLANT  03/06/2021   DRUG INDUCED ENDOSCOPY N/A 03/23/2023   Procedure: DRUG INDUCED SLEEP ENDOSCOPY;  Surgeon: Virgina Grills, MD;  Location: [facility];  Service: ENT;  Laterality: N/A;   IMPLANTATION OF HYPOGLOSSAL NERVE STIMULATOR Right 06/07/2023   Procedure: RIGHT IMPLANTATION OF HYPOGLOSSAL NERVE STIMULATOR;  Surgeon: Virgina Grills, MD;  Location: [facility];  Service: ENT;  Laterality: Right;   TOE SURGERY     WISDOM TOOTH EXTRACTION       Current Outpatient Medications on File Prior to Visit  Medication Sig Dispense Refill   acetaminophen  (TYLENOL ) 500 MG tablet Take 500 mg by mouth every 6 (six) hours as needed.     Cholecalciferol (VITAMIN D3 PO) Take  5,000 Units by mouth daily.     citalopram  (CELEXA ) 20 MG tablet Take 1 tablet (20 mg total) by mouth daily. 90 tablet 1   Cyanocobalamin  (VITAMIN B-12 PO) Take 1,000 mcg by mouth daily.     etodolac  (LODINE ) 400 MG tablet TAKE 1 TABLET (400 MG TOTAL) BY MOUTH 2 (TWO) TIMES DAILY. SCHEDULE FOLLOW UP FOR FUTURE REFILLS 60 tablet 3   famotidine  (PEPCID ) 40 MG tablet Take 1 tablet (40 mg total) by mouth daily. 30 tablet 3   Ferrous Sulfate (IRON PO) Take 65 mg by mouth 2 (two) times daily.     ferrous sulfate 325 (65 FE) MG tablet Take 325 mg by mouth 2 (two) times daily with a meal.     FOLIC ACID  PO Take 800 mcg by mouth daily.     gabapentin  (NEURONTIN ) 600 MG tablet TAKE 1 TABLET BY MOUTH THREE TIMES A DAY 90 tablet 0   HYDROcodone -acetaminophen  (NORCO/VICODIN) 5-325 MG tablet Take 1-2 tablets by mouth every 6 (six) hours as needed for moderate pain (pain score 4-6). 12 tablet 0   lamoTRIgine  (LAMICTAL ) 150 MG tablet Take 1 tablet (150 mg total) by mouth 2 (two) times daily. 60 tablet 11   methylphenidate  (RITALIN ) 10 MG tablet Take 1 tablet (10 mg total) by mouth 2 (two) times daily. 60 tablet 0   metoprolol  succinate (TOPROL -XL) 50 MG 24 hr tablet Take 1 tablet (50 mg total) by mouth daily. 90 tablet 3   Multiple Vitamin (MULTIVITAMIN) tablet Take 1 tablet by mouth daily.     nortriptyline  (PAMELOR ) 25 MG capsule TAKE 2 CAPSULES (50 MG TOTAL) BY MOUTH EVERY DAY AT BEDTIME 180 capsule 3   ondansetron  (ZOFRAN ) 8 MG tablet Take 1 tablet (8 mg total) by mouth every 8 (eight) hours as needed for nausea or vomiting. 20 tablet 3   pantoprazole  (PROTONIX ) 40 MG tablet Take 40 mg by mouth daily.     tadalafil  (CIALIS ) 20 MG tablet Take 1 tablet (20 mg total) by mouth daily as needed for erectile dysfunction. 12 tablet 5   No current facility-administered medications on file prior to visit.    No Known Allergies   DIAGNOSTIC DATA (LABS, IMAGING, TESTING) - I reviewed patient records, labs, notes,  testing and imaging myself where available.  Lab Results  Component Value Date   WBC  6.8 11/10/2022   HGB 13.4 11/10/2022   HCT 38.0 11/10/2022   MCV 108 (H) 11/10/2022   PLT 295 11/10/2022      Component Value Date/Time   NA 125 (L) 01/18/2023 1156   K 4.4 01/18/2023 1156   CL 90 (L) 01/18/2023 1156   CO2 20 01/18/2023 1156   GLUCOSE 86 01/18/2023 1156   GLUCOSE 105 (H) 04/30/2011 1109   BUN 4 (L) 01/18/2023 1156   CREATININE 0.59 (L) 01/18/2023 1156   CALCIUM 8.8 01/18/2023 1156   PROT 9.9 (H) 08/13/2021 1438   ALBUMIN 4.4 01/18/2023 1156   AST CANCELED 08/13/2021 1438   ALT 22 08/13/2021 1438   ALKPHOS 100 08/13/2021 1438   BILITOT 1.1 08/13/2021 1438   GFRNONAA >90 04/30/2011 1109   GFRAA >90 04/30/2011 1109   No results found for: CHOL, HDL, LDLCALC, LDLDIRECT, TRIG, CHOLHDL No results found for: MVHQ4O No results found for: VITAMINB12 No results found for: TSH  PHYSICAL EXAM:  Today's Vitals   11/03/23 1211  BP: 132/62  Weight: 186 lb 6.4 oz (84.6 kg)   Body mass index is 27.53 kg/m.   Wt Readings from Last 3 Encounters:  11/03/23 186 lb 6.4 oz (84.6 kg)  08/30/23 185 lb (83.9 kg)  07/20/23 196 lb (88.9 kg)     Ht Readings from Last 3 Encounters:  08/30/23 5' 9 (1.753 m)  07/20/23 5' 9 (1.753 m)  06/07/23 (P) 5' 9 (1.753 m)      General: The patient is awake, alert and appears not in acute distress. The patient is well groomed. Head: Normocephalic, atraumatic. ASSESSMENT AND PLAN 48 y.o. year old male  here with:    1) Changing  ELECTRODE PLUS- ZERO- PLUS , CHANGED TO ELECTRODE setting B .   SEE YOU IN 8 - 12 WEEKS ,  We are SCHEDULING ANOTHER IN - LAB TITRATIONON  with NEW ELECTRODE configuration.     I would like to thank Mordechai April, DO and Mordechai April, Do [address] [place],  [place] [address] for allowing me to meet with and to take care of this pleasant patient.     After spending a total time of  21   minutes face to face and additional time for physical and neurologic examination, review of laboratory studies,  personal review of imaging studies, reports and results of other testing and review of referral information / records as far as provided in visit,   Electronically signed by: Neomia Banner, MD 11/03/2023 12:36 PM  [facility] Board certified by The [employer] and Diplomate of the [employer]. Board certified In Neurology through the [employer]BP[employer], Fellow of the [employer].

## 2023-11-03 NOTE — Patient Instructions (Signed)
 HISTORY OF PRESENT ILLNESS:  Tommy Ellison is a 48 y.o. male patient who is here for revisit 11/03/2023 for  ELECTRODE  ARRANGEMENT , ELECTRODE B 1.3- 1.9 Volts.    [name] showed better lift and protrusion. Follow up in 8 weeks.  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 he was unable to use CPAP. The Epworth Sleepiness Scale was 22 / 24 (scores above or equal to 10 are suggestive of hypersomnolence).    IMPRESSION:   Sleep-disordered breathing  did not improve under INSPIRE at various tried settings. The patient's baseline study showed a lower AHI than any AHI seen during the INSPIRE treatment titration.  2. Total sleep time was within normal limits.       Recommended Settings:  none identified.    This patient will have to return for a change in electrode settings, not just voltage.     Neomia Banner,  MD

## 2023-11-03 NOTE — Telephone Encounter (Signed)
 Park Bolk the [facility] reps were here.

## 2023-11-08 ENCOUNTER — Ambulatory Visit (INDEPENDENT_AMBULATORY_CARE_PROVIDER_SITE_OTHER): Admitting: Podiatry

## 2023-11-08 DIAGNOSIS — D509 Iron deficiency anemia, unspecified: Secondary | ICD-10-CM | POA: Insufficient documentation

## 2023-11-08 DIAGNOSIS — E871 Hypo-osmolality and hyponatremia: Secondary | ICD-10-CM | POA: Insufficient documentation

## 2023-11-08 DIAGNOSIS — R1319 Other dysphagia: Secondary | ICD-10-CM | POA: Insufficient documentation

## 2023-11-08 DIAGNOSIS — B351 Tinea unguium: Secondary | ICD-10-CM | POA: Diagnosis not present

## 2023-11-08 DIAGNOSIS — K279 Peptic ulcer, site unspecified, unspecified as acute or chronic, without hemorrhage or perforation: Secondary | ICD-10-CM | POA: Insufficient documentation

## 2023-11-08 DIAGNOSIS — F411 Generalized anxiety disorder: Secondary | ICD-10-CM | POA: Insufficient documentation

## 2023-11-08 DIAGNOSIS — N411 Chronic prostatitis: Secondary | ICD-10-CM | POA: Insufficient documentation

## 2023-11-08 DIAGNOSIS — I10 Essential (primary) hypertension: Secondary | ICD-10-CM | POA: Insufficient documentation

## 2023-11-08 DIAGNOSIS — F1010 Alcohol abuse, uncomplicated: Secondary | ICD-10-CM | POA: Insufficient documentation

## 2023-11-08 DIAGNOSIS — R413 Other amnesia: Secondary | ICD-10-CM | POA: Insufficient documentation

## 2023-11-08 NOTE — Progress Notes (Unsigned)
      Subjective:  Patient ID: Tommy Ellison, male    DOB: Aug 13, 1975,  MRN: [id number]  Tommy Ellison presents to clinic today with c/o fungal nails to the bilateral great toenails.  He noted he is very hard of hearing, and tries to read lips. He does not know sign language.  He has cochlear implants.  He stated he has tried topical prescriptions in the past with no improvement.    PCP is Mordechai April, DO.  Past Medical History:  Diagnosis Date   Anxiety    Depression    Hearing loss    Hypersomnia    Hypertension    Rhinitis    Sleep apnea    Past Surgical History:  Procedure Laterality Date   COCHLEAR IMPLANT  03/06/2021   DRUG INDUCED ENDOSCOPY N/A 03/23/2023   Procedure: DRUG INDUCED SLEEP ENDOSCOPY;  Surgeon: Virgina Grills, MD;  Location:  [facility];  Service: ENT;  Laterality: N/A;   IMPLANTATION OF HYPOGLOSSAL NERVE STIMULATOR Right 06/07/2023   Procedure: RIGHT IMPLANTATION OF HYPOGLOSSAL NERVE STIMULATOR;  Surgeon: Virgina Grills, MD;  Location: [facility];  Service: ENT;  Laterality: Right;   TOE SURGERY     WISDOM TOOTH EXTRACTION     No Known Allergies  Review of Systems: Negative except as noted in the HPI.  Objective:  Vascular Examination: Capillary refill time is 3-5 seconds to toes bilateral. Palpable pedal pulses b/l LE. Digital hair present b/l.    Dermatological Examination: Pedal skin with normal turgor, texture and tone b/l. No open wounds. No interdigital macerations b/l.  The b/l hallux toenails are 3mm thick, discolored, dystrophic with subungual debris. There is pain with compression of the nail plates.    Assessment/Plan: 1. Tinea unguium     Clippings of the affected bilateral hallux toenails were obtained and sent to [facility] laboratory for fungal nail culture.  Informed patient it may take up to 3 weeks to receive the final report.  Will contact the patient to review the results.  We will discuss treatment plan at  that time and follow-up.  Order written for hepatic function panel, as the plan would be to start him on oral medication since topicals were ineffective.   Discussed topical, oral, and laser nail treatment options with the patient today.  Discussed risks involved as well.  Will message via MyChart once we get the fungal culture back.      Joe Murders, DPM, FACFAS [facility]     [address] [place], [place] [address]                Office [phone]  Fax [phone]

## 2023-11-10 ENCOUNTER — Ambulatory Visit: Payer: Medicare Other | Admitting: Neurology

## 2023-11-11 ENCOUNTER — Other Ambulatory Visit: Payer: Self-pay | Admitting: Neurology

## 2023-11-22 MED ORDER — METHYLPHENIDATE HCL 10 MG PO TABS: 10.0000 mg | ORAL_TABLET | Freq: Two times a day (BID) | ORAL | 0 refills | Status: DC

## 2023-11-22 NOTE — Telephone Encounter (Signed)
 Last seen on 08/30/23 Follow up scheduled on 02/29/24   Dispensed Days Supply Quantity Provider Pharmacy  METHYLPHENIDATE  10 MG TABLET 10/15/2023 30 60 each Sater, Charlie LABOR, MD [facility] [phone] [facility] - ..     Rx pending to be signed

## 2023-11-22 NOTE — Addendum Note (Signed)
 Addended by: DOUGLASS DELON CROME on: 11/22/2023 07:47 AM   Modules accepted: Orders

## 2023-11-23 ENCOUNTER — Encounter: Payer: Self-pay | Admitting: Podiatry

## 2023-12-16 ENCOUNTER — Other Ambulatory Visit: Payer: Self-pay | Admitting: *Deleted

## 2023-12-16 MED ORDER — METHYLPHENIDATE HCL 10 MG PO TABS: 10.0000 mg | ORAL_TABLET | Freq: Two times a day (BID) | ORAL | 0 refills | Status: DC

## 2023-12-16 NOTE — Telephone Encounter (Signed)
 Pt sent mychart asking for refill on Ritalin . Last saw Dr. Vear who manages this on 08/30/23 and next f/u 02/29/24. Last refilled 11/22/23 #60.  Looks like last note mentioned adderall but I see update from pt that he takes Ritalin  and this is what was refilled last.

## 2023-12-18 ENCOUNTER — Other Ambulatory Visit: Payer: Self-pay | Admitting: Neurology

## 2023-12-20 NOTE — Telephone Encounter (Signed)
 Last seen on 11/03/23 Follow up scheduled on 02/29/24   Dispensed Days Supply Quantity Provider Pharmacy  GABAPENTIN  600 MG TABLET 11/11/2023 30 90 each Vear Charlie LABOR, MD [facility] [phone] [facility] -

## 2023-12-21 ENCOUNTER — Ambulatory Visit (INDEPENDENT_AMBULATORY_CARE_PROVIDER_SITE_OTHER): Admitting: Neurology

## 2023-12-21 ENCOUNTER — Encounter: Payer: Self-pay | Admitting: Neurology

## 2023-12-21 ENCOUNTER — Telehealth: Payer: Self-pay | Admitting: Neurology

## 2023-12-21 VITALS — BP 148/97 | HR 104 | Ht 69.0 in | Wt 178.4 lb

## 2023-12-21 DIAGNOSIS — G6181 Chronic inflammatory demyelinating polyneuritis: Secondary | ICD-10-CM | POA: Diagnosis not present

## 2023-12-21 DIAGNOSIS — H903 Sensorineural hearing loss, bilateral: Secondary | ICD-10-CM | POA: Diagnosis not present

## 2023-12-21 DIAGNOSIS — Z9682 Presence of neurostimulator: Secondary | ICD-10-CM | POA: Diagnosis not present

## 2023-12-21 DIAGNOSIS — G4733 Obstructive sleep apnea (adult) (pediatric): Secondary | ICD-10-CM

## 2023-12-21 NOTE — Patient Instructions (Signed)
Quality Sleep Information, Adult Quality sleep is important for your mental and physical health. It also improves your quality of life. Quality sleep means you: Are asleep for most of the time you are in bed. Fall asleep within 30 minutes. Wake up no more than once a night. Are awake for no longer than 20 minutes if you do wake up during the night. Most adults need 7-8 hours of quality sleep each night. How can poor sleep affect me? If you do not get enough quality sleep, you may have: Mood swings. Daytime sleepiness. Decreased alertness, reaction time, and concentration. Sleep disorders, such as insomnia and sleep apnea. Difficulty with: Solving problems. Coping with stress. Paying attention. These issues may affect your performance and productivity at work, school, and home. Lack of sleep may also put you at higher risk for accidents, suicide, and risky behaviors. If you do not get quality sleep, you may also be at higher risk for several health problems, including: Infections. Type 2 diabetes. Heart disease. High blood pressure. Obesity. Worsening of long-term conditions, like arthritis, kidney disease, depression, Parkinson's disease, and epilepsy. What actions can I take to get more quality sleep? Sleep schedule and routine Stick to a sleep schedule. Go to sleep and wake up at about the same time each day. Do not try to sleep less on weekdays and make up for lost sleep on weekends. This does not work. Limit naps during the day to 30 minutes or less. Do not take naps in the late afternoon. Make time to relax before bed. Reading, listening to music, or taking a hot bath promotes quality sleep. Make your bedroom a place that promotes quality sleep. Keep your bedroom dark, quiet, and at a comfortable room temperature. Make sure your bed is comfortable. Avoid using electronic devices that give off bright blue light for 30 minutes before bedtime. Your brain perceives bright blue light  as sunlight. This includes television, phones, and computers. If you are lying awake in bed for longer than 20 minutes, get up and do a relaxing activity until you feel sleepy. Lifestyle     Try to get at least 30 minutes of exercise on most days. Do not exercise 2-3 hours before going to bed. Do not use any products that contain nicotine or tobacco. These products include cigarettes, chewing tobacco, and vaping devices, such as e-cigarettes. If you need help quitting, ask your health care provider. Do not drink caffeinated beverages for at least 8 hours before going to bed. Coffee, tea, and some sodas contain caffeine. Do not drink alcohol or eat large meals close to bedtime. Try to get at least 30 minutes of sunlight every day. Morning sunlight is best. Medical concerns Work with your health care provider to treat medical conditions that may affect sleeping, such as: Nasal obstruction. Snoring. Sleep apnea and other sleep disorders. Talk to your health care provider if you think any of your prescription medicines may cause you to have difficulty falling or staying asleep. If you have sleep problems, talk with a sleep consultant. If you think you have a sleep disorder, talk with your health care provider about getting evaluated by a specialist. Where to find more information Sleep Foundation: sleepfoundation.org American Academy of Sleep Medicine: aasm.org Centers for Disease Control and Prevention (CDC): cdc.gov Contact a health care provider if: You have trouble getting to sleep or staying asleep. You often wake up very early in the morning and cannot get back to sleep. You have daytime sleepiness. You   have daytime sleep attacks of suddenly falling asleep and sudden muscle weakness (narcolepsy). You have a tingling sensation in your legs with a strong urge to move your legs (restless legs syndrome). You stop breathing briefly during sleep (sleep apnea). You think you have a sleep  disorder or are taking a medicine that is affecting your quality of sleep. Summary Most adults need 7-8 hours of quality sleep each night. Getting enough quality sleep is important for your mental and physical health. Make your bedroom a place that promotes quality sleep, and avoid things that may cause you to have poor sleep, such as alcohol, caffeine, smoking, or large meals. Talk to your health care provider if you have trouble falling asleep or staying asleep. This information is not intended to replace advice given to you by your health care provider. Make sure you discuss any questions you have with your health care provider. Document Revised: 09/03/2021 Document Reviewed: 09/03/2021 Elsevier Patient Education  2024 Elsevier Inc.  

## 2023-12-21 NOTE — Telephone Encounter (Signed)
 Patient was seen for a inspire follow Rep Damien was here today.

## 2023-12-21 NOTE — Progress Notes (Addendum)
 Provider:  Dedra Gores, MD  Primary Care Physician:  Dayna Motto, DO [address] [place] [address]     Referring Provider: Dayna Motto, Do [address] [place],  [place] [address]          Chief Complaint according to patient   Patient presents with:                HISTORY OF PRESENT ILLNESS:  Tommy Ellison is a 48 y.o. male patient who is here for revisit 12/21/2023 for INSPIRE .  Chief concern according to patient :    No concern, but discussion of subjective benefit with inspire Rep;  he is using the device 11 hours , but sleeps fragmented.  Current setting of  electrodes:  B , 1.3- 1.9 Volts, now reached 1.9, his level 7.   Tongue motion was seen 1.9 and tested at 2 V and the patient felt this was not un-comfortable but  definitely stronger.  The new settings will be 1.5- 2.1 V . He will work on slow progression over the next 8 weeks.   Today's ( 12-21-2023) Epworth SS 12/ 2. FSS at 41/ 63 points.   Patient was seen for a inspire follow Rep Damien was here today.                                      Tommy Ellison is a 48 y.o. male patient who is here for revisit 11/03/2023 for  ELECTRODE  ARRANGEMENT , ELECTRODE B 1.3- 1.9 Volts.  This showed better lift and protrusion. We arranged for a follow up in 8 weeks. DISCUSSED THE SLEEP STUDY FROM 6--06-2023  : This 48 year-old Male has an autoimmune mediated o demyelinating polyneuropathy.  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 he was unable to use CPAP. The Epworth Sleepiness Scale was 22 / 24 (scores above or equal to 10 are suggestive of hypersomnolence).    IMPRESSION:  Sleep-disordered breathing  did not improve under INSPIRE at various tried settings. The patient's baseline study showed a lower AHI than any AHI seen during the INSPIRE treatment titration.  2. Total sleep time was within normal limits.     Recommended Settings:  none identified.   This patient will have to return for a change in electrode settings, not just voltage.        Review of Systems: Out of a complete 14 system review, the patient complains of only the following symptoms, and all other reviewed systems are negative.:   SLEEPINESS ?  How likely are you to doze in the following situations: 0 = not likely, 1 = slight chance, 2 = moderate chance, 3 = high chance  Sitting and Reading? Watching Television? Sitting inactive in a public place (theater or meeting)? Lying down in the afternoon when circumstances permit? Sitting and talking to someone? Sitting quietly after lunch without alcohol? In a car, while stopped for a few minutes in traffic? As a passenger in a car for an hour without a break?  Total = 12       Social History   Socioeconomic History   Marital status: Single    Spouse name: Not on file   Number of children: Not on file   Years of education: Not on file  Highest education level: Not on file  Occupational History   Not on file  Tobacco Use   Smoking status: Never   Smokeless tobacco: Never  Vaping Use   Vaping status: Never Used  Substance and Sexual Activity   Alcohol use: Yes    Comment: occ beer   Drug use: No   Sexual activity: Not on file  Other Topics Concern   Not on file  Social History Narrative   Right handed   2 cokes per    Pt disability   Pt lives alone    Social Drivers of Health   Financial Resource Strain: Not on file  Food Insecurity: Low Risk  (02/08/2023)   Received from [facility]   Hunger Vital Sign    Within the past 12 months, you worried that your food would run out before you got money to buy more: Never true    Within the past 12 months, the food you bought just didn't last and you didn't have money to get more. : Never true  Transportation Needs: No Transportation Needs (02/08/2023)   Received from [facility]    In  the past 12 months, has lack of reliable transportation kept you from medical appointments, meetings, work or from getting things needed for daily living? : No  Physical Activity: Not on file  Stress: Not on file  Social Connections: Not on file    Family History  Problem Relation Age of Onset   Arthritis Other    Depression Mother     Past Medical History:  Diagnosis Date   Anxiety    Depression    Hearing loss    Hypersomnia    Hypertension    Rhinitis    Sleep apnea     Past Surgical History:  Procedure Laterality Date   COCHLEAR IMPLANT  03/06/2021   DRUG INDUCED ENDOSCOPY N/A 03/23/2023   Procedure: DRUG INDUCED SLEEP ENDOSCOPY;  Surgeon: Carlie Clark, MD;  Location: [facility];  Service: ENT;  Laterality: N/A;   IMPLANTATION OF HYPOGLOSSAL NERVE STIMULATOR Right 06/07/2023   Procedure: RIGHT IMPLANTATION OF HYPOGLOSSAL NERVE STIMULATOR;  Surgeon: Carlie Clark, MD;  Location: [facility];  Service: ENT;  Laterality: Right;   TOE SURGERY     WISDOM TOOTH EXTRACTION       Current Outpatient Medications on File Prior to Visit  Medication Sig Dispense Refill   acetaminophen  (TYLENOL ) 500 MG tablet Take 500 mg by mouth every 6 (six) hours as needed.     amphetamine -dextroamphetamine  (ADDERALL XR) 20 MG 24 hr capsule 1 capsule in the morning Orally Once a day     bacitracin ointment Apply topically.     chlorhexidine (PERIDEX) 0.12 % solution RINSE MOUTH WITH (1 CAPFUL) FOR 30 SECONDS IN MORNING AND EVENING AFTER BRUSHING, THEN SPIT OUT     Cholecalciferol (VITAMIN D3 PO) Take 5,000 Units by mouth daily.     citalopram  (CELEXA ) 20 MG tablet Take 1 tablet (20 mg total) by mouth daily. 90 tablet 1   Cyanocobalamin  (VITAMIN B-12 PO) Take 1,000 mcg by mouth daily.     etodolac  (LODINE ) 400 MG tablet TAKE 1 TABLET (400 MG TOTAL) BY MOUTH 2 (TWO) TIMES DAILY. SCHEDULE FOLLOW UP FOR FUTURE REFILLS 60 tablet 3   famotidine  (PEPCID ) 40 MG tablet  Take 1 tablet (40 mg total) by mouth daily. 30 tablet 3   Ferrous Sulfate (IRON PO) Take 65 mg by mouth 2 (two)  times daily.     ferrous sulfate 325 (65 FE) MG tablet Take 325 mg by mouth 2 (two) times daily with a meal.     FOLIC ACID  PO Take 800 mcg by mouth daily.     gabapentin  (NEURONTIN ) 600 MG tablet TAKE 1 TABLET BY MOUTH THREE TIMES A DAY 90 tablet 2   HYDROcodone -acetaminophen  (NORCO/VICODIN) 5-325 MG tablet Take 1-2 tablets by mouth every 6 (six) hours as needed for moderate pain (pain score 4-6). 12 tablet 0   ibuprofen (ADVIL) 800 MG tablet TAKE 1 TABLET BY MOUTH EVERY 6 TO 8 HOURS AS NEEDED     lamoTRIgine  (LAMICTAL ) 150 MG tablet Take 1 tablet (150 mg total) by mouth 2 (two) times daily. 60 tablet 11   loratadine (CLARITIN) 10 MG tablet 1 tablet Orally Once a day     methylphenidate  (RITALIN ) 10 MG tablet Take 1 tablet (10 mg total) by mouth 2 (two) times daily. 60 tablet 0   metoprolol  succinate (TOPROL -XL) 50 MG 24 hr tablet Take 1 tablet (50 mg total) by mouth daily. 90 tablet 3   Multiple Vitamin (MULTIVITAMIN) tablet Take 1 tablet by mouth daily.     mupirocin ointment (BACTROBAN) 2 % Apply topically 2 (two) times daily.     nortriptyline  (PAMELOR ) 25 MG capsule TAKE 2 CAPSULES (50 MG TOTAL) BY MOUTH EVERY DAY AT BEDTIME 180 capsule 3   ondansetron  (ZOFRAN ) 8 MG tablet Take 1 tablet (8 mg total) by mouth every 8 (eight) hours as needed for nausea or vomiting. 20 tablet 3   pantoprazole  (PROTONIX ) 40 MG tablet Take 40 mg by mouth daily.     sildenafil  (VIAGRA ) 100 MG tablet See Admin Instructions. PLEASE SEE ATTACHED FOR DETAILED DIRECTIONS     tadalafil  (CIALIS ) 20 MG tablet Take 1 tablet (20 mg total) by mouth daily as needed for erectile dysfunction. 12 tablet 5   VYVGART HYTRULO [phone] MG-UNIT/ML SOLN injection Subcutaneous for 28 Days     No current facility-administered medications on file prior to visit.    No Known Allergies   DIAGNOSTIC DATA (LABS, IMAGING,  TESTING) - I reviewed patient records, labs, notes, testing and imaging myself where available.  Lab Results  Component Value Date   WBC 6.8 11/10/2022   HGB 13.4 11/10/2022   HCT 38.0 11/10/2022   MCV 108 (H) 11/10/2022   PLT 295 11/10/2022      Component Value Date/Time   NA 125 (L) 01/18/2023 1156   K 4.4 01/18/2023 1156   CL 90 (L) 01/18/2023 1156   CO2 20 01/18/2023 1156   GLUCOSE 86 01/18/2023 1156   GLUCOSE 105 (H) 04/30/2011 1109   BUN 4 (L) 01/18/2023 1156   CREATININE 0.59 (L) 01/18/2023 1156   CALCIUM 8.8 01/18/2023 1156   PROT 9.9 (H) 08/13/2021 1438   ALBUMIN 4.4 01/18/2023 1156   AST CANCELED 08/13/2021 1438   ALT 22 08/13/2021 1438   ALKPHOS 100 08/13/2021 1438   BILITOT 1.1 08/13/2021 1438   GFRNONAA >90 04/30/2011 1109   GFRAA >90 04/30/2011 1109   No results found for: CHOL, HDL, LDLCALC, LDLDIRECT, TRIG, CHOLHDL No results found for: YHAJ8R No results found for: VITAMINB12 No results found for: TSH  PHYSICAL EXAM:  Vitals:   12/21/23 1121  BP: (!) 148/97  Pulse: (!) 104  SpO2: 98%   No data found. Body mass index is 26.35 kg/m.   Wt Readings from Last 3 Encounters:  12/21/23 178 lb 6.4 oz (80.9 kg)  11/03/23 186 lb 6.4 oz (84.6 kg)  08/30/23 185 lb (83.9 kg)     Ht Readings from Last 3 Encounters:  12/21/23 5' 9 (1.753 m)  08/30/23 5' 9 (1.753 m)  07/20/23 5' 9 (1.753 m)      General: The patient is awake, alert and appears not in acute distress and groomed. Head: Normocephalic, atraumatic.  NThe patient is awake, alert and appears not in acute distress. The patient  has facial hair , reads lips.  Head: Normocephalic, atraumatic. Neck is supple.  Mallampati 3,  neck circumference:18 inches .  Nasal airflow patent.  Retrognathia is not seen.  Dental status:  biological  Skin:  Without evidence of ankle edema, or rash. Trunk: The patient's posture is erect.   NEUROLOGIC EXAM: The patient is awake and alert,  oriented to place and time.   Speech is fluent,  with dysphonia - volume is high due to hearing loss . Mood and affect are appropriate.   Cranial nerves: no loss of smell or taste reported  Pupils are equal and briskly reactive to light.  Extraocular movements in vertical and horizontal planes were intact and without nystagmus. No Diplopia. Visual fields by finger perimetry are intact. Hearing impaired   Facial motor strength is symmetric and tongue and uvula move midline.  Neck ROM : rotation, tilt and flexion extension were normal for age and shoulder shrug was symmetrical.    Normal grip strength and dexterity.  DTR attenuated- Ataxic,  wide based gait.     ASSESSMENT AND PLAN :   48 y.o. year old male  here with:    1) Sleep study will not be needed now- he is highly compliant.    2)   recheck in 3 months, after further up titration in Voltage -   he has 2 more levels now to reach.     I would like to thank Dayna Motto, DO and Dayna Motto, Do [address] [place],  [place] [address] for allowing me to meet with this pleasant patient.     Sleep Clinic Patients are generally offered input on sleep hygiene, life style changes and how to improve compliance with medical treatment where applicable. Review and reiteration of good sleep hygiene measures is offered to any sleep clinic patient, be it in the first consultation or with any follow up visits.   The patient's condition requires frequent monitoring and adjustments in the treatment plan, reflecting the ongoing complexity of care.  This provider is the continuing focal point for all needed services for this condition.  After spending a total time of  25  minutes face to face and time for  history taking, physical and neurologic examination, review of laboratory studies,  personal review of imaging studies, reports and results of other testing and review of referral information / records as far as provided in visit,    Electronically signed by: Dedra Gores, MD 12/21/2023 11:52 AM  [facility] Board certified by The [employer] of Sleep Medicine and Diplomate of the [employer] of Sleep Medicine. Board certified In Neurology through the ABPN, Fellow of the [employer] of Neurology.

## 2024-01-10 ENCOUNTER — Other Ambulatory Visit: Payer: Self-pay | Admitting: Neurology

## 2024-01-11 ENCOUNTER — Encounter: Payer: Self-pay | Admitting: Neurology

## 2024-01-11 NOTE — Telephone Encounter (Signed)
 Last seen on 08/30/23 Follow up scheduled on 02/29/24

## 2024-01-20 ENCOUNTER — Encounter: Payer: Self-pay | Admitting: Neurology

## 2024-01-21 ENCOUNTER — Other Ambulatory Visit: Payer: Self-pay | Admitting: *Deleted

## 2024-01-21 ENCOUNTER — Other Ambulatory Visit: Payer: Self-pay | Admitting: Podiatry

## 2024-01-21 MED ORDER — METHYLPHENIDATE HCL 10 MG PO TABS: 10.0000 mg | ORAL_TABLET | Freq: Two times a day (BID) | ORAL | 0 refills | Status: DC

## 2024-01-21 NOTE — Telephone Encounter (Signed)
 Last seen 08/30/23 and next f/u 02/29/24. Last refilled methylphenidate  12/20/23 #60

## 2024-01-24 ENCOUNTER — Ambulatory Visit: Payer: Self-pay | Admitting: Podiatry

## 2024-02-07 ENCOUNTER — Ambulatory Visit (INDEPENDENT_AMBULATORY_CARE_PROVIDER_SITE_OTHER): Admitting: Podiatry

## 2024-02-07 ENCOUNTER — Encounter: Payer: Self-pay | Admitting: Podiatry

## 2024-02-07 DIAGNOSIS — M79675 Pain in left toe(s): Secondary | ICD-10-CM | POA: Diagnosis not present

## 2024-02-07 DIAGNOSIS — B351 Tinea unguium: Secondary | ICD-10-CM | POA: Diagnosis not present

## 2024-02-07 DIAGNOSIS — M79674 Pain in right toe(s): Secondary | ICD-10-CM

## 2024-02-07 DIAGNOSIS — R748 Abnormal levels of other serum enzymes: Secondary | ICD-10-CM

## 2024-02-07 MED ORDER — CICLOPIROX 8 % EX SOLN: Freq: Every day | CUTANEOUS | 11 refills | Status: AC

## 2024-02-07 MED ORDER — FAMOTIDINE 40 MG PO TABS: 40.0000 mg | ORAL_TABLET | Freq: Every day | ORAL | 0 refills | Status: DC

## 2024-02-07 NOTE — Progress Notes (Unsigned)
 Subjective:  Patient ID: Tommy Ellison, male    DOB: [birth year],  MRN: [id number]  TEAGHAN FORMICA presents to clinic today for:  Chief Complaint  Patient presents with   Nail Problem    3 month fungal nail recheck. Fungal culture taken. Unable to continue Lamisil due to elevated enzymes. Non diabetic.   Patient notes nails are thick, discolored, elongated and painful in shoegear when trying to ambulate.  He is here to discuss his bloodwork results and treatment options for the fungal nails.   PCP is Dayna Motto, DO.  Past Medical History:  Diagnosis Date   Anxiety    Depression    Hearing loss    Hypersomnia    Hypertension    Rhinitis    Sleep apnea    Past Surgical History:  Procedure Laterality Date   COCHLEAR IMPLANT  03/06/2021   DRUG INDUCED ENDOSCOPY N/A 03/23/2023   Procedure: DRUG INDUCED SLEEP ENDOSCOPY;  Surgeon: Carlie Clark, MD;  Location: [facility];  Service: ENT;  Laterality: N/A;   IMPLANTATION OF HYPOGLOSSAL NERVE STIMULATOR Right 06/07/2023   Procedure: RIGHT IMPLANTATION OF HYPOGLOSSAL NERVE STIMULATOR;  Surgeon: Carlie Clark, MD;  Location: [facility];  Service: ENT;  Laterality: Right;   TOE SURGERY     WISDOM TOOTH EXTRACTION     No Known Allergies  Review of Systems: Negative except as noted in the HPI.  Objective:  Vascular Examination: Capillary refill time is 3-5 seconds to toes bilateral. Palpable pedal pulses b/l LE. Digital hair present b/l.  Skin temperature gradient WNL b/l. No varicosities b/l. No cyanosis noted b/l.   Dermatological Examination: Pedal skin with normal turgor, texture and tone b/l. No open wounds. No interdigital macerations b/l. Toenails x10 are 3mm thick, discolored, dystrophic with subungual debris. There is pain with compression of the nail plates.  They are elongated x10    Assessment/Plan: 1. Pain due to onychomycosis of toenails of both feet     Meds ordered this encounter   Medications   ciclopirox  (PENLAC ) 8 % solution    Sig: Apply topically at bedtime. Apply thin layer over nail. Apply daily over previous coat. Remove weekly with polish remover.    Dispense:  6.6 mL    Refill:  11   The mycotic toenails were sharply debrided x10 with sterile nail nippers and a power debriding burr to decrease bulk/thickness and length.    Discussed his high liver enzymes.  He has not yet contacted his PCP to follow up about the abnormal blood test results.  He may need ultrasound of liver or additional bloodwork to evaluate cause of abnormal levels.  Will not prescribe oral medication due to elevated liver enzymes.  Will send in Rx topical antifungal for him to apply every day for up to one year.  Recheck in a few months to check progress. He may request permanent removal of all the toenails in the future, but he may need to follow up with another provider in our group for this, as I feel the risks outweigh the benefits.   F/u 4 months.   Awanda CHARM Imperial, DPM, FACFAS [facility]     [address], [place] [address]  Office [phone]  Fax [phone]

## 2024-02-16 ENCOUNTER — Other Ambulatory Visit: Payer: Self-pay | Admitting: *Deleted

## 2024-02-16 MED ORDER — METHYLPHENIDATE HCL 10 MG PO TABS: 10.0000 mg | ORAL_TABLET | Freq: Two times a day (BID) | ORAL | 0 refills | Status: DC

## 2024-02-16 NOTE — Telephone Encounter (Signed)
 Dr.Athar you are work in provider this morning Patient requesting refill on Ritalin  10 mg tablet.   Dispensed Days Supply Quantity Provider Pharmacy  METHYLPHENIDATE  10 MG TABLET 01/21/2024 30 60 each Sater, Charlie LABOR, MD [phone] - ...      Rx pending to be signed for a fill date on 02/21/24.

## 2024-02-29 ENCOUNTER — Ambulatory Visit: Admitting: Neurology

## 2024-02-29 ENCOUNTER — Encounter: Payer: Self-pay | Admitting: Neurology

## 2024-02-29 VITALS — BP 145/89 | HR 92 | Ht 69.0 in | Wt 173.5 lb

## 2024-02-29 DIAGNOSIS — H903 Sensorineural hearing loss, bilateral: Secondary | ICD-10-CM | POA: Diagnosis not present

## 2024-02-29 DIAGNOSIS — R269 Unspecified abnormalities of gait and mobility: Secondary | ICD-10-CM

## 2024-02-29 DIAGNOSIS — G4733 Obstructive sleep apnea (adult) (pediatric): Secondary | ICD-10-CM | POA: Diagnosis not present

## 2024-02-29 DIAGNOSIS — G6181 Chronic inflammatory demyelinating polyneuritis: Secondary | ICD-10-CM

## 2024-02-29 DIAGNOSIS — N521 Erectile dysfunction due to diseases classified elsewhere: Secondary | ICD-10-CM

## 2024-02-29 DIAGNOSIS — Z9682 Presence of neurostimulator: Secondary | ICD-10-CM

## 2024-02-29 MED ORDER — GABAPENTIN 800 MG PO TABS: 800.0000 mg | ORAL_TABLET | Freq: Three times a day (TID) | ORAL | 11 refills | Status: AC

## 2024-02-29 NOTE — Progress Notes (Addendum)
 [facility]  PATIENT: Tommy Ellison DOB: May 11, 1976  REFERRING DOCTOR OR PCP: Lamar Ng, MD SOURCE: Patient, notes from primary care  _________________________________   HISTORICAL  CHIEF COMPLAINT:  Chief Complaint  Patient presents with   Follow-up    Pt in room 10. Here for follow up.    HISTORY OF PRESENT ILLNESS:  Tommy Ellison is a 48 year old man with deafness and polyneuropathy since 2020.      Update 02/29/2024: He is on Vyvgart (was on IVIG) and for the most part has tolerated the injections well.  However, an injection about a month ago was associated with a hematoma and he continues to experience a nodule and some discoloration there.  He notes the nurse who came that day was not the regular nurse.   He does them once a week.     He feels the CIDP is doing at least as well as IVIg.    He has had 5-6 of the injections so far.     Before the IVIG we had tried prednisone  without benefit.  He is having more dysesthetic pain in his feet and legs.  Feels like walking on glass or bug bites. SABRA  He takes gabapentin  600 mg 3 times a day with some benefit  Tramadol  just helped a Tommy bit and he stopped.  Also on lamotrigine  150 mg po bid and nortriptyline  50 mg qHS.   When in more pain he drinks more and had one fall causing a black eye     Gait is stable.  He is walking without his cane now.  He tries to walk outdoors daily.  No recent fall.   He will be getting AFOs bilaterally.   He feels strength and sensation are doing slightly better on Vyvgart.  He can walkup to half mile.  He does not keep up with others due to the foot drop.   Sensation is better but he has a lot of foot pain still.  He also has dysesthetic pain in the feet with both a cold sensation and pins and needle sensation.   Bladder function is fine.  He has ED and that has not been helped much by Cialis  or Viagra .  His wife asked about injections.  OSA/narcolepsy:   HST 08/13/2021  showed moderate OSA with AHI 24.8/hr, REM AHI 33.7/hr and O2 nadir 85%. AutoPAP was tried but he had poor compliance.     APAP upset his stomach and he swallowed a lot of air.  As he could not tolerate, he was evaluated for [facility] and did the surgery late 2024 (Dr. Chalice follows) and he had an adjustment at the last visit and sees her again later this month   he has dreams of animals attacking him.   He takes naps x 2 for 1 hour each.   He has had a few spells of sleep paralysis but has not had cataplexy (no spells associated with emotion).     Hypnagogic  hallucinations are better on nortriptyline  and lamotrigine .   Deafness: He now has a cochlear implant and is able to hear a Tommy bit ;   History of neuropathy:  He had generally been in good health until 2020.  He became deaf over a few days in July 2020. However hearing had declined some in preceding month (mildly muffled).   He also became more ubbalanced that month and fell 12/03/2020.     He has had ED  since 2020 as well.  Dysesthesias and numbness began around September or October 2020.  Balance worsened around that time as well and he had a fall at that time.  He feels symptoms have progressively worsened since the onset.   He notes mild weakness in his legs but not in his hands.      He notes no bladder changes.  He has a history of moderate alcohol abuse but drinks much less over the past couple of years.      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    CSF did not show elevated protein or any other abnormality.   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      Labs 11/02/2019.  SPEP/IEF showed a polyclonal increase in IgM.  SSA/SSB, cryoglobulins and thiamine  were normal.   Anti-mag IgM was  negative.  Hepatitis labs were negative.      I reviewed lab work from primary care.  B12 and TSH were normal.  He has no family history of polyneuropathy that he is aware of.  REVIEW OF SYSTEMS: Constitutional: No fevers, chills, sweats, or change in appetite Eyes: No visual changes, double vision, eye pain it looks like the medicine is only $9 for 30 pills with good Rx it looks like the medicine is only $9 at [facility] with good Rx it if there is a Medicare appeal or preauthorization we can do that. Ear, nose and throat: No hearing loss, ear pain, nasal congestion, sore throat Cardiovascular: No chest pain, palpitations Respiratory:  No shortness of breath at rest or with exertion.   No wheezes GastrointestinaI: No nausea, vomiting, diarrhea, abdominal pain, fecal incontinence Genitourinary:  No dysuria, urinary retention or frequency.  No nocturia. Musculoskeletal:  No neck pain, back pain Integumentary: No rash, pruritus, skin lesions Neurological: as above Psychiatric: No depression at this time.  No anxiety Endocrine: No palpitations, diaphoresis, change in appetite, change in weigh or increased thirst Hematologic/Lymphatic:  No anemia, purpura, petechiae. Allergic/Immunologic: No itchy/runny eyes, nasal congestion, recent allergic reactions, rashes  ALLERGIES: No Known Allergies  HOME MEDICATIONS:  Current Outpatient Medications:    acetaminophen  (TYLENOL ) 500 MG tablet, Take 500 mg by mouth every 6 (six) hours as needed., Disp: , Rfl:    amphetamine -dextroamphetamine  (ADDERALL XR) 20 MG 24 hr capsule, 1 capsule in the morning Orally Once a day, Disp: , Rfl:    bacitracin ointment, Apply topically., Disp: , Rfl:    chlorhexidine (PERIDEX) 0.12 % solution, RINSE MOUTH WITH (1 CAPFUL) FOR 30 SECONDS IN MORNING AND EVENING AFTER BRUSHING, THEN SPIT OUT, Disp: , Rfl:    Cholecalciferol (VITAMIN D3 PO), Take 5,000 Units by mouth daily., Disp: , Rfl:    ciclopirox  (PENLAC ) 8 %  solution, Apply topically at bedtime. Apply thin layer over nail. Apply daily over previous coat. Remove weekly with polish remover., Disp: 6.6 mL, Rfl: 11   citalopram  (CELEXA ) 20 MG tablet, Take 1 tablet (20 mg total) by mouth daily., Disp: 90 tablet, Rfl: 1   Cyanocobalamin  (VITAMIN B-12 PO), Take 1,000 mcg by mouth daily., Disp: , Rfl:    etodolac  (LODINE ) 400 MG tablet, TAKE 1 TABLET (400 MG TOTAL) BY MOUTH 2 (TWO) TIMES DAILY. SCHEDULE FOLLOW UP FOR FUTURE REFILLS, Disp: 60 tablet, Rfl: 3   famotidine  (PEPCID ) 40 MG  tablet, Take 1 tablet (40 mg total) by mouth daily., Disp: 30 tablet, Rfl: 0   ibuprofen (ADVIL) 800 MG tablet, TAKE 1 TABLET BY MOUTH EVERY 6 TO 8 HOURS AS NEEDED, Disp: , Rfl:    lamoTRIgine  (LAMICTAL ) 150 MG tablet, Take 1 tablet (150 mg total) by mouth 2 (two) times daily., Disp: 60 tablet, Rfl: 11   loratadine (CLARITIN) 10 MG tablet, 1 tablet Orally Once a day, Disp: , Rfl:    methylphenidate  (RITALIN ) 10 MG tablet, Take 1 tablet (10 mg total) by mouth 2 (two) times daily., Disp: 60 tablet, Rfl: 0   metoprolol  succinate (TOPROL -XL) 50 MG 24 hr tablet, Take 1 tablet (50 mg total) by mouth daily., Disp: 90 tablet, Rfl: 3   Multiple Vitamin (MULTIVITAMIN) tablet, Take 1 tablet by mouth daily., Disp: , Rfl:    mupirocin ointment (BACTROBAN) 2 %, Apply topically 2 (two) times daily., Disp: , Rfl:    nortriptyline  (PAMELOR ) 25 MG capsule, TAKE 2 CAPSULES (50 MG TOTAL) BY MOUTH EVERY DAY AT BEDTIME, Disp: 180 capsule, Rfl: 3   ondansetron  (ZOFRAN ) 8 MG tablet, Take 1 tablet (8 mg total) by mouth every 8 (eight) hours as needed for nausea or vomiting., Disp: 20 tablet, Rfl: 3   pantoprazole  (PROTONIX ) 40 MG tablet, Take 40 mg by mouth daily., Disp: , Rfl:    sildenafil  (VIAGRA ) 100 MG tablet, See Admin Instructions. PLEASE SEE ATTACHED FOR DETAILED DIRECTIONS, Disp: , Rfl:    tadalafil  (CIALIS ) 20 MG tablet, Take 1 tablet (20 mg total) by mouth daily as needed for erectile  dysfunction., Disp: 12 tablet, Rfl: 5   VYVGART HYTRULO [id number] MG-UNIT/ML SOLN injection, Subcutaneous for 28 Days, Disp: , Rfl:    Ferrous Sulfate (IRON PO), Take 65 mg by mouth 2 (two) times daily. (Patient not taking: Reported on 02/29/2024), Disp: , Rfl:    ferrous sulfate 325 (65 FE) MG tablet, Take 325 mg by mouth 2 (two) times daily with a meal. (Patient not taking: Reported on 02/29/2024), Disp: , Rfl:    FOLIC ACID  PO, Take 800 mcg by mouth daily. (Patient not taking: Reported on 02/29/2024), Disp: , Rfl:    gabapentin  (NEURONTIN ) 800 MG tablet, Take 1 tablet (800 mg total) by mouth 3 (three) times daily., Disp: 90 tablet, Rfl: 11  PAST MEDICAL HISTORY: Past Medical History:  Diagnosis Date   Anxiety    Depression    Hearing loss    Hypersomnia    Hypertension    Rhinitis    Sleep apnea     PAST SURGICAL HISTORY: Past Surgical History:  Procedure Laterality Date   COCHLEAR IMPLANT  03/06/2021   DRUG INDUCED ENDOSCOPY N/A 03/23/2023   Procedure: DRUG INDUCED SLEEP ENDOSCOPY;  Surgeon: Carlie Clark, MD;  Location: [facility];  Service: ENT;  Laterality: N/A;   IMPLANTATION OF HYPOGLOSSAL NERVE STIMULATOR Right 06/07/2023   Procedure: RIGHT IMPLANTATION OF HYPOGLOSSAL NERVE STIMULATOR;  Surgeon: Carlie Clark, MD;  Location: [facility];  Service: ENT;  Laterality: Right;   TOE SURGERY     WISDOM TOOTH EXTRACTION      FAMILY HISTORY: Family History  Problem Relation Age of Onset   Arthritis Other    Depression Mother     SOCIAL HISTORY:  Social History   Socioeconomic History   Marital status: Single    Spouse name: Not on file   Number of children: Not on file   Years of education: Not on file   Highest  education level: Not on file  Occupational History   Not on file  Tobacco Use   Smoking status: Never   Smokeless tobacco: Never  Vaping Use   Vaping status: Never Used  Substance and Sexual Activity   Alcohol use: Yes     Comment: occ beer   Drug use: No   Sexual activity: Not on file  Other Topics Concern   Not on file  Social History Narrative   Right handed   2 cokes per    Pt disability   Pt lives alone    Social Drivers of Health   Financial Resource Strain: Not on file  Food Insecurity: Low Risk  (02/08/2023)   Received from [facility]   Hunger Vital Sign    Within the past 12 months, you worried that your food would run out before you got money to buy more: Never true    Within the past 12 months, the food you bought just didn't last and you didn't have money to get more. : Never true  Transportation Needs: No Transportation Needs (02/08/2023)   Received from [facility]    In the past 12 months, has lack of reliable transportation kept you from medical appointments, meetings, work or from getting things needed for daily living? : No  Physical Activity: Not on file  Stress: Not on file  Social Connections: Not on file  Intimate Partner Violence: Not on file     PHYSICAL EXAM  Vitals:   02/29/24 1511  BP: (!) 145/89  Pulse: 92  Weight: 173 lb 8 oz (78.7 kg)  Height: 5' 9 (1.753 m)     Body mass index is 25.62 kg/m.   General: The patient is well-developed and well-nourished and in no acute distress  HEENT:  Head is Gary/AT.  Sclera are anicteric.   Neck/musculoskeletal: The neck is nontender with good range of motion.  He has some tenderness over the lumbar spine and paraspinal muscles  Skin: Extremities are without rash or  edema.  Neurologic Exam  Mental status: The patient is alert and oriented x 3 at the time of the examination. The patient has apparent normal recent and remote memory, with an apparently normal attention span and concentration ability.   Speech is normal  Cranial nerves: Extraocular movements are full. Pupils are small and not definitely reactive.  Facial strength and sensation was normal.  Trapezius and sternocleidomastoid strength  is normal. No dysarthria is noted.   He is deaf  Motor:  Muscle bulk is normal.   Tone is normal. Strength is  5 / 5 in all 4 extremities except 4/5 in the anke and toe extensors.   .   Reduced fine motor in hands.    Sensory: He has normal vibration sensation  in the knees and fingertips relative to the proximal arms.  He has only 20-25% sensation at the ankles and < 10% vibration sensation at the toes.  There is reduced pinprick sensation below the knee worse in the feet.  His sensory exam is similar to last visit.  Coordination: Cerebellar testing reveals good finger-nose-finger and heel-to-shin bilaterally.  Gait and station: Station is normal.   Gait is wide.  He is unable to tandem walk..   Romberg is borderline,   Reflexes: Deep tendon reflexes are symmetric and normal in the arms but absent in the legs.   .      ASSESSMENT AND PLAN  CIDP (chronic inflammatory demyelinating polyneuropathy) (HCC) -  Plan: Ambulatory referral to Urology  OSA (obstructive sleep apnea)  S/P placement of hypoglossal nerve stimulator  Bilateral sensorineural hearing loss  Gait disorder  Erectile dysfunction due to diseases classified elsewhere - Plan: Ambulatory referral to Urology   1.   He has an inflammatory polyneuropathy.  He started Vyvgart abd has a home nurse give the infusion.  He has had 5-6 injections so far and tolerates it well. 2.  He has OSA and unable to use CPAP.  He got the [facility] Device (Dr. Carlie and Dr. Chalice).  Next appt with Dr. Chalice is in 2-3 weeks.   Nortriptyline  and Adderall have helped the sleepiness and the hypnagogic hallucinations.  3.    He will continue gabapentin  600 .  Continue lamotrigine  150 mg po bid 4.    His wife notes he has had more anxiety  5.   His ED has not been helped by Viagra  or Cialis .  I will refer to urology  6.  return to see us  in 6 months or sooner if there are new or worsening neurologic symptoms or based on the results of the  tests  40-minute office visit with the majority of the time spent face-to-face for history and physical, discussion/counseling and decision-making.  Additional time with record review and documentation.  This visit is part of a comprehensive longitudinal care medical relationship regarding the patients primary diagnosis of CIDP and related concerns.   Sharissa Brierley A. Vear, MD, [facility] 02/29/2024, 5:53 PM Certified in Neurology, Clinical Neurophysiology, Sleep Medicine and Neuroimaging  [facility] [address], [address] [place], [place] [address] [phone]

## 2024-03-01 ENCOUNTER — Telehealth: Payer: Self-pay | Admitting: Neurology

## 2024-03-01 NOTE — Telephone Encounter (Signed)
 Referral To Urology faxed to [facility]   [facility]  [phone] Fax: [phone]

## 2024-03-02 ENCOUNTER — Other Ambulatory Visit: Payer: Self-pay | Admitting: Neurology

## 2024-03-02 NOTE — Telephone Encounter (Signed)
 Last seen on 107/25 Follow up scheduled on 09/14/23  Did you want pt to continue Rx? I didn't see it  mentioned in chart.

## 2024-03-18 ENCOUNTER — Other Ambulatory Visit: Payer: Self-pay | Admitting: Neurology

## 2024-03-19 ENCOUNTER — Encounter: Payer: Self-pay | Admitting: Neurology

## 2024-03-20 MED ORDER — METHYLPHENIDATE HCL 10 MG PO TABS: 10.0000 mg | ORAL_TABLET | Freq: Two times a day (BID) | ORAL | 0 refills | Status: DC

## 2024-03-20 NOTE — Telephone Encounter (Signed)
 Last seen on 02/29/24 Follow up scheduled on 09/13/24  Rx was sen ton 02/29/24 for 800 mg tablets, this request is for 600 mg tablet. Rx denied.

## 2024-03-20 NOTE — Telephone Encounter (Signed)
 Last seen on 02/29/24 Follow up scheduled on 09/14/23   Dispensed Days Supply Quantity Provider Pharmacy  METHYLPHENIDATE  10 MG TABLET 02/21/2024 30 60 each Athar, Saima, MD [phone] [facility]   Rx pending to be signed

## 2024-03-22 ENCOUNTER — Telehealth: Payer: Self-pay | Admitting: Neurology

## 2024-03-22 ENCOUNTER — Ambulatory Visit: Admitting: Neurology

## 2024-03-22 ENCOUNTER — Encounter: Payer: Self-pay | Admitting: Neurology

## 2024-03-22 VITALS — BP 136/92 | HR 90 | Ht 69.0 in | Wt 179.2 lb

## 2024-03-22 DIAGNOSIS — H838X3 Other specified diseases of inner ear, bilateral: Secondary | ICD-10-CM

## 2024-03-22 DIAGNOSIS — R400 Somnolence: Secondary | ICD-10-CM

## 2024-03-22 DIAGNOSIS — Z9682 Presence of neurostimulator: Secondary | ICD-10-CM

## 2024-03-22 DIAGNOSIS — G6181 Chronic inflammatory demyelinating polyneuritis: Secondary | ICD-10-CM

## 2024-03-22 NOTE — Progress Notes (Addendum)
 Provider:  Dedra Gores, MD  Primary Care Physician:  Dayna Motto, DO [address] [place] [address]     Referring Provider: Dayna Motto, Do [address] [place],  [place] [address]          Chief Complaint according to patient   Patient presents with:                HISTORY OF PRESENT ILLNESS:  Tommy Ellison is a 48 y.o. male patient who is here for revisit 03/22/2024 for  another Inspire visit, with further increasing the range of voltage under the D formation.  He sleeps on his couch.  After formation B caused a deviation of the tongue to the right,  we set formation D - still snoring  Resetting level 1 - 7, starting at 1.8 V.     Tommy Ellison is a 48 y.o. male patient who is here for revisit 12/21/2023 for INSPIRE .  No concern, but discussion of subjective benefit with inspire Rep;  he is using the device 11 hours , but sleeps fragmented.  Current setting of  electrodes: set in formation  B , 1.3- 1.9 Volts, now reached 1.9, his level no 7.    Tongue motion was seen 1.9 and tested at 2 V and the patient felt this was not un-comfortable but  definitely stronger.  The new settings will be 1.5- 2.1 V . He will work on slow progression over the next 8 weeks.    Today's ( 12-21-2023) Epworth SS 12/ 2. FSS at 41/ 63 points.    Patient was seen for a inspire follow Rep Damien was here today.        Tommy Ellison is a 48 y.o. male patient who is here for revisit 11/03/2023 for  ELECTRODE  ARRANGEMENT , ELECTRODE B 1.3- 1.9 Volts.    [name] showed better lift and protrusion. Follow up in 8 weeks.  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 he was unable to use CPAP. The Epworth Sleepiness Scale was 22 / 24 (scores above or equal to 10 are suggestive of  hypersomnolence).  Review of Systems: Out of a complete 14 system review, the patient complains of only the following symptoms, and all other reviewed systems are negative.:   SLEEPINESS ?  How likely are you to doze in the following situations: 0 = not likely, 1 = slight chance, 2 = moderate chance, 3 = high chance  Sitting and Reading? Watching Television? Sitting inactive in a public place (theater or meeting)? Lying down in the afternoon when circumstances permit? Sitting and talking to someone? Sitting quietly after lunch without alcohol? In a car, while stopped for a few minutes in traffic? As a passenger in a car for an hour without a break?  Total = 15/ 24  FSS 46/ 63      Social History   Socioeconomic History   Marital status: Single    Spouse name: Not on file   None     Years of education: Not on file   Highest education level: Not on file  Occupational History   Not on file  Tobacco Use   Smoking status: Never   Smokeless tobacco: Never  Vaping  Use   Vaping status: Never Used  Substance and Sexual Activity   Alcohol use: Yes    Comment: occ beer   Drug use: No   Sexual activity: Not on file  Other Topics Concern   Not on file  Social History Narrative   Right handed   2 cokes per    Pt disability   Pt lives alone    Social Drivers of Health   Financial Resource Strain: Not on file  Food Insecurity: Low Risk  (02/08/2023)   Received from [facility]   Hunger Vital Sign    Within the past 12 months, you worried that your food would run out before you got money to buy more: Never true    Within the past 12 months, the food you bought just didn't last and you didn't have money to get more. : Never true  Transportation Needs: No Transportation Needs (02/08/2023)   Received from [facility]    In the past 12 months, has lack of reliable transportation kept you from medical appointments, meetings, work or from getting things  needed for daily living? : No  Physical Activity: Not on file  Stress: Not on file  Social Connections: Not on file    Family History  Problem Relation Age of Onset   Arthritis Other    Depression Mother     Past Medical History:  Diagnosis Date   Anxiety    Depression    Hearing loss    Hypersomnia    Hypertension    Rhinitis    Sleep apnea     Past Surgical History:  Procedure Laterality Date   COCHLEAR IMPLANT  03/06/2021   DRUG INDUCED ENDOSCOPY N/A 03/23/2023   Procedure: DRUG INDUCED SLEEP ENDOSCOPY;  Surgeon: Carlie Clark, MD;  Location: [facility];  Service: ENT;  Laterality: N/A;   IMPLANTATION OF HYPOGLOSSAL NERVE STIMULATOR Right 06/07/2023   Procedure: RIGHT IMPLANTATION OF HYPOGLOSSAL NERVE STIMULATOR;  Surgeon: Carlie Clark, MD;  Location: [facility];  Service: ENT;  Laterality: Right;   TOE SURGERY     WISDOM TOOTH EXTRACTION       Current Outpatient Medications on File Prior to Visit  Medication Sig Dispense Refill   acetaminophen  (TYLENOL ) 500 MG tablet Take 500 mg by mouth every 6 (six) hours as needed.     amphetamine -dextroamphetamine  (ADDERALL XR) 20 MG 24 hr capsule 1 capsule in the morning Orally Once a day     bacitracin ointment Apply topically.     chlorhexidine (PERIDEX) 0.12 % solution RINSE MOUTH WITH (1 CAPFUL) FOR 30 SECONDS IN MORNING AND EVENING AFTER BRUSHING, THEN SPIT OUT     Cholecalciferol (VITAMIN D3 PO) Take 5,000 Units by mouth daily.     ciclopirox  (PENLAC ) 8 % solution Apply topically at bedtime. Apply thin layer over nail. Apply daily over previous coat. Remove weekly with polish remover. 6.6 mL 11   citalopram  (CELEXA ) 20 MG tablet Take 1 tablet (20 mg total) by mouth daily. 90 tablet 1   Cyanocobalamin  (VITAMIN B-12 PO) Take 1,000 mcg by mouth daily.     etodolac  (LODINE ) 400 MG tablet TAKE 1 TABLET (400 MG TOTAL) BY MOUTH 2 (TWO) TIMES DAILY. SCHEDULE FOLLOW UP FOR FUTURE REFILLS 60 tablet 3    famotidine  (PEPCID ) 40 MG tablet TAKE 1 TABLET BY MOUTH EVERY DAY 90 tablet 2   Ferrous Sulfate (IRON PO) Take 65 mg by mouth 2 (two) times daily.  ferrous sulfate 325 (65 FE) MG tablet Take 325 mg by mouth 2 (two) times daily with a meal.     FOLIC ACID  PO Take 800 mcg by mouth daily.     gabapentin  (NEURONTIN ) 800 MG tablet Take 1 tablet (800 mg total) by mouth 3 (three) times daily. 90 tablet 11   ibuprofen (ADVIL) 800 MG tablet TAKE 1 TABLET BY MOUTH EVERY 6 TO 8 HOURS AS NEEDED     lamoTRIgine  (LAMICTAL ) 150 MG tablet Take 1 tablet (150 mg total) by mouth 2 (two) times daily. 60 tablet 11   loratadine (CLARITIN) 10 MG tablet 1 tablet Orally Once a day     methylphenidate  (RITALIN ) 10 MG tablet Take 1 tablet (10 mg total) by mouth 2 (two) times daily. 60 tablet 0   metoprolol  succinate (TOPROL -XL) 50 MG 24 hr tablet Take 1 tablet (50 mg total) by mouth daily. 90 tablet 3   Multiple Vitamin (MULTIVITAMIN) tablet Take 1 tablet by mouth daily.     mupirocin ointment (BACTROBAN) 2 % Apply topically 2 (two) times daily.     nortriptyline  (PAMELOR ) 25 MG capsule TAKE 2 CAPSULES (50 MG TOTAL) BY MOUTH EVERY DAY AT BEDTIME 180 capsule 3   ondansetron  (ZOFRAN ) 8 MG tablet Take 1 tablet (8 mg total) by mouth every 8 (eight) hours as needed for nausea or vomiting. 20 tablet 3   pantoprazole  (PROTONIX ) 40 MG tablet Take 40 mg by mouth daily.     sildenafil  (VIAGRA ) 100 MG tablet See Admin Instructions. PLEASE SEE ATTACHED FOR DETAILED DIRECTIONS     tadalafil  (CIALIS ) 20 MG tablet Take 1 tablet (20 mg total) by mouth daily as needed for erectile dysfunction. 12 tablet 5   VYVGART HYTRULO [phone] MG-UNIT/ML SOLN injection Subcutaneous for 28 Days     No current facility-administered medications on file prior to visit.    No Known Allergies   DIAGNOSTIC DATA (LABS, IMAGING, TESTING) - I reviewed patient records, labs, notes, testing and imaging myself where available.  Lab Results  Component  Value Date   WBC 6.8 11/10/2022   HGB 13.4 11/10/2022   HCT 38.0 11/10/2022   MCV 108 (H) 11/10/2022   PLT 295 11/10/2022      Component Value Date/Time   NA 125 (L) 01/18/2023 1156   K 4.4 01/18/2023 1156   CL 90 (L) 01/18/2023 1156   CO2 20 01/18/2023 1156   GLUCOSE 86 01/18/2023 1156   GLUCOSE 105 (H) 04/30/2011 1109   BUN 4 (L) 01/18/2023 1156   CREATININE 0.59 (L) 01/18/2023 1156   CALCIUM 8.8 01/18/2023 1156   PROT 9.9 (H) 08/13/2021 1438   ALBUMIN 4.4 01/18/2023 1156   AST CANCELED 08/13/2021 1438   ALT 22 08/13/2021 1438   ALKPHOS 100 08/13/2021 1438   BILITOT 1.1 08/13/2021 1438   GFRNONAA >90 04/30/2011 1109   GFRAA >90 04/30/2011 1109   No results found for: CHOL, HDL, LDLCALC, LDLDIRECT, TRIG, CHOLHDL No results found for: YHAJ8R No results found for: VITAMINB12 No results found for: TSH  PHYSICAL EXAM:  Vitals:   03/22/24 1111  BP: (!) 136/92  Pulse: 90  SpO2: 99%   No data found. Body mass index is 26.46 kg/m.   Wt Readings from Last 3 Encounters:  03/22/24 179 lb 3.2 oz (81.3 kg)  02/29/24 173 lb 8 oz (78.7 kg)  12/21/23 178 lb 6.4 oz (80.9 kg)     Ht Readings from Last 3 Encounters:  03/22/24 5' 9 (1.753 m)  02/29/24 5' 9 (1.753 m)  12/21/23 5' 9 (1.753 m)      General: The patient is awake, alert and appears not in acute distress and groomed. Head: Normocephalic, cochlear implant in place.  neck circumference:18 inches .   Nasal airflow not fully  patent.   Overbite seen.  Dental status: biologic Cardiovascular:  Regular rate and cardiac rhythm by pulse, without distended neck veins. Respiratory: no shortness of breath  Skin:  Without evidence of ankle edema, or rash. Trunk: areflexia.                                ASSESSMENT AND PLAN :   48 y.o. year old male  here with:INSPIRE After formation B caused a deviation of the tongue to the right,  we set formation D - still snoring  Resetting  level 1 - 7, starting at 1.8 V.   Next follow up, HST is scheduled in 6 weeks- Watch pat preferred.    I would like to thank Dayna Motto, DO and Dayna Motto, Do [address] [place],  [place] [address] for allowing me to meet with this pleasant patient.  Review and reiteration of good sleep hygiene measures is offered to any sleep clinic patient, be it in the first consultation or with any follow up visits.    Any patient with sleepiness should be cautioned not to drive, work at heights, or operate dangerous or heavy equipment when feeling tired or sleepy.      The patient will be seen in follow-up in the sleep clinic at [facility] for discussion of test results, sleep related symptoms and treatment compliance review, further management strategies, etc.   The referring provider will be notified of the test results.   The patient's condition requires frequent monitoring and adjustments in the treatment plan, reflecting the ongoing complexity of care.  This provider is the continuing focal point for all needed services for this condition.  After spending a total time of  30  minutes face to face and time for  history taking, physical and neurologic examination, review of laboratory studies,  personal review of imaging studies, reports and results of other testing and review of referral information / records as far as provided in visit,   Electronically signed by: Dedra Gores, MD 03/22/2024 11:42 AM  [facility] Board certified by The [employer] of Sleep Medicine and Diplomate of the [employer] of Sleep Medicine. Board certified In Neurology through the ABPN, Fellow of the [employer] of [employer].

## 2024-03-22 NOTE — Telephone Encounter (Signed)
 Patient was seen today for an inspire follow up. Some changes were made and inspire rep Damien was here for today visit.

## 2024-04-17 MED ORDER — METHYLPHENIDATE HCL 10 MG PO TABS: 10.0000 mg | ORAL_TABLET | Freq: Two times a day (BID) | ORAL | 0 refills | Status: DC

## 2024-04-17 NOTE — Telephone Encounter (Signed)
  Last seen on 02/29/24 Follow up scheduled on 09/14/23       Dispenses   Dispensed Days Supply Quantity Provider Pharmacy  methylphenidate  10 mg tablet 03/20/2024 30 60 tablet Sater, Charlie LABOR, MD [phone] ...  METHYLPHENIDATE  10 MG TABLET 02/21/2024 30 60 each Athar, Saima, MD [phone] [facility] ...  METHYLPHENIDATE  10 MG TABLET 01/21/2024 30 60 each Sater, Charlie LABOR, MD [facility] [phone] [facility] ...  METHYLPHENIDATE  10 MG TABLET 12/20/2023 30 60 each Sater, Charlie LABOR, [phone] [facility] ...  METHYLPHENIDATE  10 MG TABLET 11/22/2023 30 60 each Sater, Charlie LABOR, [phone] ...  METHYLPHENIDATE  10 MG TABLET 10/15/2023 30 60 each Sater, Charlie LABOR, [facility] [phone] [facility] - ...  METHYLPHENIDATE  10 MG TABLET 09/14/2023 30 60 each Vear Charlie LABOR, MD [facility] [phone] [facility] - ...  METHYLPHENIDATE  10 MG TABLET 08/16/2023 30 60 each Sater, Charlie LABOR, MD [phone] ...  METHYLPHENIDATE  10 MG TABLET 07/16/2023 30 60 each Sater, Charlie LABOR, MD [facility] [phone] [facility] - .SABRASABRA

## 2024-04-17 NOTE — Addendum Note (Signed)
 Addended by: ONEITA NEVELYN BRAVO on: 04/17/2024 09:43 AM   Modules accepted: Orders

## 2024-04-21 ENCOUNTER — Other Ambulatory Visit: Payer: Self-pay | Admitting: Neurology

## 2024-05-09 ENCOUNTER — Encounter: Payer: Self-pay | Admitting: Neurology

## 2024-05-09 ENCOUNTER — Other Ambulatory Visit: Payer: Self-pay | Admitting: Neurology

## 2024-05-09 NOTE — Telephone Encounter (Signed)
 Called [facility]  Transferred to referral  line , No answer  LVM  to follow up about  Pt referrals   Re faxed Referral

## 2024-05-09 NOTE — Telephone Encounter (Signed)
 thanks

## 2024-05-09 NOTE — Telephone Encounter (Signed)
 Pt states he has not been contacted by anyone regarding this referral. Can someone check into this and contact [facility] again? Confirm they have the referral and will call patient?

## 2024-05-10 NOTE — Telephone Encounter (Signed)
 Last seen on 03/22/24 Follow up scheduled on 09/13/24

## 2024-05-11 NOTE — Telephone Encounter (Signed)
 Called [facility]  Spoke to [place] , They have received Pt referral and will be giving him call if Not tomorrow Monday

## 2024-05-11 NOTE — Telephone Encounter (Signed)
 Thank you :)

## 2024-05-15 ENCOUNTER — Ambulatory Visit: Admitting: Neurology

## 2024-05-15 DIAGNOSIS — G4710 Hypersomnia, unspecified: Secondary | ICD-10-CM

## 2024-05-15 DIAGNOSIS — Z9682 Presence of neurostimulator: Secondary | ICD-10-CM

## 2024-05-15 DIAGNOSIS — H838X3 Other specified diseases of inner ear, bilateral: Secondary | ICD-10-CM

## 2024-05-15 DIAGNOSIS — R400 Somnolence: Secondary | ICD-10-CM

## 2024-05-15 DIAGNOSIS — H903 Sensorineural hearing loss, bilateral: Secondary | ICD-10-CM

## 2024-05-15 DIAGNOSIS — G6181 Chronic inflammatory demyelinating polyneuritis: Secondary | ICD-10-CM

## 2024-05-15 NOTE — Telephone Encounter (Signed)
 [facility] called stating that Pt is Scheduled to be seen  06-12-2024

## 2024-05-16 NOTE — Progress Notes (Signed)
 "     Tommy Ellison 48 year old male 08/25/1975        [facility]  Tommy Ellison 49 year old male 07/20/1975   HOME SLEEP TEST REPORT ( by Watch PAT)   STUDY DATE: 05-15-2024      ORDERING CLINICIAN:  Dedra Gores, MD for Tommy Crete, MD  REFERRING CLINICIAN:  Bernardino Boone, DO    CLINICAL INFORMATION/HISTORY: 48 year old male  patient of Dr Ellison with [facility],  previously on IVIG now Vyvgart.  Last tested by HST for  OSA in Spring of 2023.  Received an inspire implanted hypoglossal Nerve stimulator. revisit 12/21/2023 for INSPIRE .  Chief concern according to patient :     No concern, but discussion of subjective benefit with inspire Rep;  he is using the device 11 hours , but sleeps fragmented.  Current setting of  electrodes:  B , 1.3- 1.9 Volts, now reached 1.9, his level 7.    Tongue motion was seen 1.9 and tested at 2 V and the patient felt this was not un-comfortable but  definitely stronger.  The new settings will be 1.5- 2.1 V . He will work on slow progression over the next 8 weeks.    Today's ( 12-21-2023) Epworth SS 12/ 2. FSS at 41/ 63 points.             BMI: 25  kg/m   Neck Circumference: N/A   FINDINGS:   Total Recording Time (hours, min):   9 h 46 minutes      Total Sleep Time (hours, min):   8 hours 13 minutes               Percent REM (%):   12.8%                                     Respiratory Indices:   Calculated pAHI (per AASM  and  CMS guideline):AASM:  29.3/h and by CMS: 15.4/h                         REM pAHI by  4% desaturation, CMS scores: 14.7/h                                            NREM pAHI:      15.5/h                         Positional AHI:   Slept prone most of the recording night, AHI was nearly equally high ( 19.8 and 15.5/h)  in supine and prone sleep,  while non - supine sleep on the right side had the lowest associated AHI of 108/h    Snoring:     mean Vol of 42 db, mild to moderate .                                             Oxygen Saturation Statistics:   Oxygen Saturation (%) Mean:  94% , between 80 and 99 %  O2 Saturation (minutes) <89%:  4.6 minutes           Pulse Rate Statistics:   Pulse Mean (bpm):   85 bpm, between 60 and 124 bpm.                         IMPRESSION:  This HST confirms the presence of  moderate obstructive sleep apnea with clinically non -relevant degree of hypoxia.  There was a high variability in heart rate seen.  This device however does not provide cardiac rhythm data.    RECOMMENDATION: I like for Tommy Ellison to continue using INSPIRE, but I see no significant benefit in AHI at current settings ,based on the comparison data from the March 2023 HST ( when the patient was not yet on Inspire- the AHI was 18/h) . Further titration of the device's voltage  may be needed.     Any patient should be cautioned not to drive, work at heights, or operate dangerous or heavy equipment when tired or sleepy.   Review of good sleep hygiene measures is accessible to any sleep clinic patient and can be reiterated through online material- I we recommend the Guide to better Sleep   by the NIH.   Weight loss and Core Strength improvement is highly recommended for individuals with low muscle tone and/ or a BMI over 30.  Any CPAP patient should be reminded to be fully compliant with PAP therapy , (defined as using PAP therapy for more than 4 hours each night ) with the goal to improve sleep related symptoms and decrease long term cardiovascular risks. Any PAP therapy patient should be reminded, that it may take up to 3 months to get fully used to using PAP and it may take 1-2 weeks for an established CPAP user to acclimatize to changes in pressure or mask. The earlier full compliance is achieved, the better long term compliance tends to be.   Please note that untreated obstructive sleep apnea may carry additional perioperative  morbidity. Patients with significant obstructive sleep apnea should receive perioperative PAP therapy and the surgical team should be informed of the diagnosis and degree of sleep disordered breathing.  Sleep fragmentation in the presence of normal proportional sleep stages is a nonspecific findings and per se does not signify an intrinsic sleep disorder or a cause for the patient's sleep-related symptoms.  Causes include (but are not limited to) the unfamiliarity of sleeping while recorded by HST device or sleeping in a sleep lab for a full Polysomnography sleep study, but also circadian rhythm disturbances, medication side effects or an underlying mood disorder or medical problem.   The referring physician will be notified of the test results.       INTERPRETING PHYSICIAN:   Dedra Gores, MD  [facility] Board certified by The [employer] of Sleep Medicine and Diplomate of the [employer] Medicine. Board certified In Neurology through the ABPN, Fellow of the [employer].                          "

## 2024-05-22 ENCOUNTER — Encounter: Payer: Self-pay | Admitting: Neurology

## 2024-05-23 ENCOUNTER — Telehealth: Payer: Self-pay | Admitting: Neurology

## 2024-05-23 ENCOUNTER — Other Ambulatory Visit: Payer: Self-pay

## 2024-05-23 NOTE — Telephone Encounter (Signed)
 Pt friend Dirk called to request medication refill for Pt  methylphenidate  (RITALIN ) 10 MG tablet  Pt is out of medication  and would  like to medication [facility]   [id number] [place], [place] - [address] (Ph: [phone])

## 2024-05-30 MED ORDER — METHYLPHENIDATE HCL 10 MG PO TABS: 10.0000 mg | ORAL_TABLET | Freq: Two times a day (BID) | ORAL | 0 refills | Status: DC

## 2024-05-30 NOTE — Telephone Encounter (Signed)
 Pt last seen 10/29 with Dr Chalice, has upcoming 09/13/24 with Dr Vear. Last filled 11/28 30 day. Refill is appropriate

## 2024-05-30 NOTE — Telephone Encounter (Signed)
 Pt's friend has called back to f/u on the request for the refill of the methylphenidate  (RITALIN ) 10 MG tablet .  Pt is out, please assist in getting filled for pt.

## 2024-06-02 ENCOUNTER — Ambulatory Visit: Payer: Self-pay | Admitting: Neurology

## 2024-06-02 NOTE — Procedures (Signed)
 "    Tommy Ellison 49 year old male Oct 12, 1975        [facility]  [name] Josh 49 year old male September 06, 1975   HOME SLEEP TEST REPORT ( by Watch PAT)   STUDY DATE: 05-15-2024      ORDERING CLINICIAN:  Dedra Gores, MD for Tommy Crete, MD  REFERRING CLINICIAN:  Bernardino Boone, DO    CLINICAL INFORMATION/HISTORY: 49 year old male  patient of Dr Ellison with [facility],  previously on IVIG now Vyvgart.  Last tested by HST for  OSA in Spring of 2023.  Received an inspire implanted hypoglossal Nerve stimulator. revisit 12/21/2023 for INSPIRE .  Chief concern according to patient :     No concern, but discussion of subjective benefit with inspire Rep;  he is using the device 11 hours , but sleeps fragmented.  Current setting of  electrodes:  B , 1.3- 1.9 Volts, now reached 1.9, his level 7.    Tongue motion was seen 1.9 and tested at 2 V and the patient felt this was not un-comfortable but  definitely stronger.  The new settings will be 1.5- 2.1 V . He will work on slow progression over the next 8 weeks.    Today's ( 12-21-2023) Epworth SS 12/ 2. FSS at 41/ 63 points.             BMI: 25  kg/m   Neck Circumference: N/A   FINDINGS:   Total Recording Time (hours, min):   9 h 46 minutes      Total Sleep Time (hours, min):   8 hours 13 minutes               Percent REM (%):   12.8%                                     Respiratory Indices:   Calculated pAHI (per AASM  and  CMS guideline):AASM:  29.3/h and by CMS: 15.4/h                         REM pAHI by  4% desaturation, CMS scores: 14.7/h                                            NREM pAHI:      15.5/h                         Positional AHI:   Slept prone most of the recording night, AHI was nearly equally high ( 19.8 and 15.5/h)  in supine and prone sleep,  while non - supine sleep on the right side had the lowest associated AHI of 108/h    Snoring:     mean Vol of 42 db, mild to moderate .                                             Oxygen Saturation Statistics:   Oxygen Saturation (%) Mean:  94% , between 80 and 99 %  O2 Saturation (minutes) <89%:  4.6 minutes           Pulse Rate Statistics:   Pulse Mean (bpm):   85 bpm, between 60 and 124 bpm.                         IMPRESSION:  This HST confirms the presence of  moderate obstructive sleep apnea with clinically non -relevant degree of hypoxia.  There was a high variability in heart rate seen.  This device however does not provide cardiac rhythm data.    RECOMMENDATION: I like for [name] to continue using INSPIRE, but I see no significant benefit in AHI at current settings ,based on the comparison data from the March 2023 HST ( when the patient was not yet on Inspire- the AHI was 18/h) . Further titration of the device's voltage  may be needed.     Any patient should be cautioned not to drive, work at heights, or operate dangerous or heavy equipment when tired or sleepy.   Review of good sleep hygiene measures is accessible to any sleep clinic patient and can be reiterated through online material- I we recommend the Guide to better Sleep   by the NIH.   Weight loss and Core Strength improvement is highly recommended for individuals with low muscle tone and/ or a BMI over 30.  Any CPAP patient should be reminded to be fully compliant with PAP therapy , (defined as using PAP therapy for more than 4 hours each night ) with the goal to improve sleep related symptoms and decrease long term cardiovascular risks. Any PAP therapy patient should be reminded, that it may take up to 3 months to get fully used to using PAP and it may take 1-2 weeks for an established CPAP user to acclimatize to changes in pressure or mask. The earlier full compliance is achieved, the better long term compliance tends to be.   Please note that untreated obstructive sleep apnea may carry additional perioperative  morbidity. Patients with significant obstructive sleep apnea should receive perioperative PAP therapy and the surgical team should be informed of the diagnosis and degree of sleep disordered breathing.  Sleep fragmentation in the presence of normal proportional sleep stages is a nonspecific findings and per se does not signify an intrinsic sleep disorder or a cause for the patient's sleep-related symptoms.  Causes include (but are not limited to) the unfamiliarity of sleeping while recorded by HST device or sleeping in a sleep lab for a full Polysomnography sleep study, but also circadian rhythm disturbances, medication side effects or an underlying mood disorder or medical problem.   The referring physician will be notified of the test results.       INTERPRETING PHYSICIAN:   Dedra Gores, MD  [facility] and [facility] Board certified by The [employer] Medicine and Diplomate of the [employer]. Board certified In Neurology through the ABPN, Fellow of the [employer].                         "

## 2024-06-12 ENCOUNTER — Ambulatory Visit: Admitting: Podiatry

## 2024-06-12 ENCOUNTER — Encounter: Payer: Self-pay | Admitting: Podiatry

## 2024-06-12 VITALS — Ht 69.0 in | Wt 179.2 lb

## 2024-06-12 DIAGNOSIS — B351 Tinea unguium: Secondary | ICD-10-CM

## 2024-06-12 DIAGNOSIS — M79674 Pain in right toe(s): Secondary | ICD-10-CM | POA: Diagnosis not present

## 2024-06-12 NOTE — Progress Notes (Signed)
 "  Chief Complaint  Patient presents with   Nail Problem    Pt is here to f/u on fungal nail check. States not much changes.    Subjective: 49 y.o. male presenting today for follow-up evaluation of painful symptomatic toenails to the bilateral feet ongoing for several years.  They are very painful and frustrating.  He states that this has been ongoing ever since high school.  He has a chronic history of nail dystrophy and pain associated to the toenails.  He has tried multiple conservative modalities and he wishes to have the toenails removed permanently  Past Medical History:  Diagnosis Date   Anxiety    Depression    Hearing loss    Hypersomnia    Hypertension    Rhinitis    Sleep apnea     Past Surgical History:  Procedure Laterality Date   COCHLEAR IMPLANT  03/06/2021   DRUG INDUCED ENDOSCOPY N/A 03/23/2023   Procedure: DRUG INDUCED SLEEP ENDOSCOPY;  Surgeon: Carlie Clark, MD;  Location: [facility];  Service: ENT;  Laterality: N/A;   IMPLANTATION OF HYPOGLOSSAL NERVE STIMULATOR Right 06/07/2023   Procedure: RIGHT IMPLANTATION OF HYPOGLOSSAL NERVE STIMULATOR;  Surgeon: Carlie Clark, MD;  Location: [facility];  Service: ENT;  Laterality: Right;   TOE SURGERY     WISDOM TOOTH EXTRACTION      Allergies[1]  Objective: Physical Exam General: The patient is alert and oriented x3 in no acute distress.  Dermatology: Hyperkeratotic, discolored, thickened, onychodystrophy noted. Skin is warm, dry and supple bilateral lower extremities.  Significant pain and tenderness associated to the toenails  Vascular: Palpable pedal pulses bilaterally. No edema or erythema noted. Capillary refill within normal limits.  Neurological: Grossly intact via light touch  Musculoskeletal Exam: No pedal deformity noted  Assessment: #1  Painful symptomatic onychomycosis of toenails for several years 1-5 bilateral  Plan of Care:  -Patient evaluated -The patient is  very frustrated with the pain that he experiences with the toenails on daily basis.  He would like to have them permanently removed.  After discussion with the patient I do believe that this is appropriate at this time since it has been several years without improvement despite conservative treatment and care -I have no reason to believe that the toenails should not heal routinely.  Today we discussed total permanent nail avulsion of the toenails including the procedure and the recovery course.  Risk benefits advantages and disadvantages of the procedure were explained in detail to the patient.  No guarantees were expressed or implied.  The patient consents and would like to proceed with -We will plan to have this performed in our procedure room first thing in the morning.  Authorization for the procedure was initiated today.  The procedure will consist of total permanent nail avulsion with chemical matricectomy digits 1-5 of the right foot.  We will then proceed with the contralateral foot after healing has been achieved -Return to clinic morning of procedure   Thresa EMERSON Sar, DPM [facility]  Dr. Thresa EMERSON Sar, DPM    [address] [place], [place] [address]                Office [phone]  Fax [phone]        [  1] No Known Allergies  "

## 2024-06-26 ENCOUNTER — Encounter: Payer: Self-pay | Admitting: Neurology

## 2024-06-27 MED ORDER — METHYLPHENIDATE HCL 10 MG PO TABS: 10.0000 mg | ORAL_TABLET | Freq: Two times a day (BID) | ORAL | 0 refills | Status: AC

## 2024-09-13 ENCOUNTER — Ambulatory Visit: Admitting: Neurology
